# Patient Record
Sex: Female | Born: 1955 | Race: White | Hispanic: No | Marital: Married | Smoking: Never smoker
Health system: Southern US, Community
[De-identification: ages and names within clinical notes are randomized; demographics above are authoritative.]

## PROBLEM LIST (undated history)

## (undated) DIAGNOSIS — G40909 Epilepsy, unspecified, not intractable, without status epilepticus: Secondary | ICD-10-CM

## (undated) DIAGNOSIS — Z923 Personal history of irradiation: Secondary | ICD-10-CM

## (undated) DIAGNOSIS — Z8489 Family history of other specified conditions: Secondary | ICD-10-CM

## (undated) DIAGNOSIS — G253 Myoclonus: Secondary | ICD-10-CM

## (undated) DIAGNOSIS — R112 Nausea with vomiting, unspecified: Secondary | ICD-10-CM

## (undated) DIAGNOSIS — K5732 Diverticulitis of large intestine without perforation or abscess without bleeding: Secondary | ICD-10-CM

## (undated) DIAGNOSIS — G43909 Migraine, unspecified, not intractable, without status migrainosus: Secondary | ICD-10-CM

## (undated) DIAGNOSIS — N809 Endometriosis, unspecified: Secondary | ICD-10-CM

## (undated) DIAGNOSIS — M797 Fibromyalgia: Secondary | ICD-10-CM

## (undated) DIAGNOSIS — N179 Acute kidney failure, unspecified: Secondary | ICD-10-CM

## (undated) DIAGNOSIS — G4730 Sleep apnea, unspecified: Secondary | ICD-10-CM

## (undated) DIAGNOSIS — B029 Zoster without complications: Secondary | ICD-10-CM

## (undated) DIAGNOSIS — C4490 Unspecified malignant neoplasm of skin, unspecified: Secondary | ICD-10-CM

## (undated) DIAGNOSIS — Z9889 Other specified postprocedural states: Secondary | ICD-10-CM

## (undated) DIAGNOSIS — M3500 Sicca syndrome, unspecified: Secondary | ICD-10-CM

## (undated) DIAGNOSIS — R569 Unspecified convulsions: Secondary | ICD-10-CM

## (undated) DIAGNOSIS — C50919 Malignant neoplasm of unspecified site of unspecified female breast: Secondary | ICD-10-CM

## (undated) HISTORY — DX: Fibromyalgia: M79.7

## (undated) HISTORY — PX: BREAST BIOPSY: SHX20

## (undated) HISTORY — PX: TUBAL LIGATION: SHX77

## (undated) HISTORY — PX: KNEE CARTILAGE SURGERY: SHX688

## (undated) HISTORY — DX: Endometriosis, unspecified: N80.9

## (undated) HISTORY — DX: Diverticulitis of large intestine without perforation or abscess without bleeding: K57.32

## (undated) HISTORY — DX: Malignant neoplasm of unspecified site of unspecified female breast: C50.919

## (undated) HISTORY — DX: Unspecified convulsions: R56.9

## (undated) HISTORY — DX: Migraine, unspecified, not intractable, without status migrainosus: G43.909

## (undated) HISTORY — DX: Zoster without complications: B02.9

## (undated) HISTORY — DX: Epilepsy, unspecified, not intractable, without status epilepticus: G40.909

## (undated) HISTORY — PX: OTHER SURGICAL HISTORY: SHX169

---

## 1993-02-15 HISTORY — PX: LAPAROSCOPIC CHOLECYSTECTOMY: SUR755

## 2008-12-12 ENCOUNTER — Ambulatory Visit: Payer: Self-pay | Admitting: Family Medicine

## 2008-12-12 DIAGNOSIS — B029 Zoster without complications: Secondary | ICD-10-CM | POA: Insufficient documentation

## 2008-12-17 ENCOUNTER — Telehealth: Payer: Self-pay | Admitting: Family Medicine

## 2008-12-31 ENCOUNTER — Ambulatory Visit: Payer: Self-pay | Admitting: Family Medicine

## 2008-12-31 DIAGNOSIS — L538 Other specified erythematous conditions: Secondary | ICD-10-CM | POA: Insufficient documentation

## 2008-12-31 DIAGNOSIS — R569 Unspecified convulsions: Secondary | ICD-10-CM

## 2008-12-31 DIAGNOSIS — E538 Deficiency of other specified B group vitamins: Secondary | ICD-10-CM

## 2008-12-31 HISTORY — DX: Deficiency of other specified B group vitamins: E53.8

## 2009-01-01 LAB — CONVERTED CEMR LAB
Basophils Relative: 0 % (ref 0–1)
CO2: 20 meq/L (ref 19–32)
Calcium: 9.5 mg/dL (ref 8.4–10.5)
Chloride: 102 meq/L (ref 96–112)
Creatinine, Ser: 0.94 mg/dL (ref 0.40–1.20)
Eosinophils Absolute: 0.1 10*3/uL (ref 0.0–0.7)
Glucose, Bld: 88 mg/dL (ref 70–99)
Hemoglobin: 14.5 g/dL (ref 12.0–15.0)
MCHC: 32.4 g/dL (ref 30.0–36.0)
MCV: 97.4 fL (ref 78.0–100.0)
Monocytes Absolute: 0.5 10*3/uL (ref 0.1–1.0)
Monocytes Relative: 12 % (ref 3–12)
RBC: 4.6 M/uL (ref 3.87–5.11)
Sodium: 140 meq/L (ref 135–145)
Total Bilirubin: 0.4 mg/dL (ref 0.3–1.2)
Total Protein: 7.1 g/dL (ref 6.0–8.3)
Vitamin B-12: 750 pg/mL (ref 211–911)

## 2009-10-01 ENCOUNTER — Encounter: Payer: Self-pay | Admitting: Family Medicine

## 2009-10-06 ENCOUNTER — Encounter: Payer: Self-pay | Admitting: Family Medicine

## 2009-10-23 ENCOUNTER — Encounter: Payer: Self-pay | Admitting: Family Medicine

## 2009-10-24 ENCOUNTER — Encounter: Payer: Self-pay | Admitting: Family Medicine

## 2009-12-18 ENCOUNTER — Encounter: Payer: Self-pay | Admitting: Family Medicine

## 2009-12-19 ENCOUNTER — Ambulatory Visit: Payer: Self-pay | Admitting: Family Medicine

## 2009-12-19 DIAGNOSIS — M2550 Pain in unspecified joint: Secondary | ICD-10-CM | POA: Insufficient documentation

## 2009-12-19 DIAGNOSIS — M797 Fibromyalgia: Secondary | ICD-10-CM

## 2009-12-20 ENCOUNTER — Encounter: Payer: Self-pay | Admitting: Family Medicine

## 2009-12-22 LAB — CONVERTED CEMR LAB
Anti Nuclear Antibody(ANA): NEGATIVE
Sed Rate: 16 mm/hr (ref 0–22)

## 2009-12-29 ENCOUNTER — Telehealth: Payer: Self-pay | Admitting: Family Medicine

## 2010-01-05 ENCOUNTER — Telehealth: Payer: Self-pay | Admitting: Family Medicine

## 2010-01-15 HISTORY — PX: APPENDECTOMY: SHX54

## 2010-01-23 ENCOUNTER — Ambulatory Visit: Payer: Self-pay | Admitting: Family Medicine

## 2010-01-23 DIAGNOSIS — M25569 Pain in unspecified knee: Secondary | ICD-10-CM

## 2010-03-12 ENCOUNTER — Ambulatory Visit: Admit: 2010-03-12 | Payer: Self-pay | Admitting: Family Medicine

## 2010-03-17 DIAGNOSIS — T8131XA Disruption of external operation (surgical) wound, not elsewhere classified, initial encounter: Secondary | ICD-10-CM | POA: Insufficient documentation

## 2010-03-17 NOTE — Letter (Signed)
Summary: [facility] @ [place]   Imported By: Lanelle Bal 10/16/2009 08:36:31  _____________________________________________________________________  External Attachment:    Type:   Image     Comment:   External Document
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
## 2010-03-17 NOTE — Progress Notes (Signed)
Summary: Med question  Phone Note Call from Patient   Caller: Patient Summary of Call: Called and states that the new med she has started taking "Savella" is making her dizzy and she would like to go back to CymBalta and is not sure how to quite the Savella starter pack and go back to her Cymbalta... Call her  back at [phone] Initial call taken by: Michaelle Copas,  January 05, 2010 10:11 AM  Follow-up for Phone Call        we stopped cymbalta b/c it wasn't working -- now she wants to go back on it??? Follow-up by: Seymour Bars DO,  January 05, 2010 10:24 AM  Additional Follow-up for Phone Call Additional follow up Details #1::        Yes states would like to give it another try Additional Follow-up by: Kathlene November LPN,  January 05, 2010 10:30 AM    Additional Follow-up for Phone Call Additional follow up Details #2::    if she was only up to the 25 mg dose of Savella, OK to stop and switch to Cymbalta -- have her pick up a sample box of 30 mg to start for the first wk and I will send in RX for Cymbalta 60s.    Have f/u visit in 4 wks. Follow-up by: Seymour Bars DO,  January 05, 2010 12:45 PM   Appended Document: Med question   Appended Document: Med question 01/05/2010 @ 12:52pm- Pt notified of above instructions. KJ LPN

## 2010-03-17 NOTE — Progress Notes (Signed)
Summary: pt. with a med. concern  Phone Note Call from Patient   Caller: Patient Summary of Call: pt .needs a call back Re: Savella medication, and she is wondering if this med. will effect her having grandma seizures since she is on other meds for her Seizures.... She has already taken 1 pill this morning... Please call her back as soon as possible [phone] as she is worried that this may effect her because of what she has read... Thanks.Michaelle Copas  December 29, 2009 1:51 PM  Initial call taken by: Michaelle Copas,  December 29, 2009 1:51 PM  Follow-up for Phone Call        no, our EMR does a drug interaction check and it does not pull up as an interaction with her seizure meds. Follow-up by: Seymour Bars DO,  December 29, 2009 4:46 PM

## 2010-03-19 ENCOUNTER — Telehealth: Payer: Self-pay | Admitting: Family Medicine

## 2010-03-19 NOTE — Letter (Signed)
Summary: [facility]  [facility]   Imported By: Lanelle Bal 02/13/2010 12:05:46  _____________________________________________________________________  External Attachment:    Type:   Image     Comment:   External Document

## 2010-03-19 NOTE — Letter (Signed)
Summary: [facility]  [facility]   Imported By: Lanelle Bal 02/13/2010 12:07:03  _____________________________________________________________________  External Attachment:    Type:   Image     Comment:   External Document

## 2010-03-19 NOTE — Letter (Signed)
Summary: [facility]  [facility]   Imported By: Lanelle Bal 02/13/2010 12:04:05  _____________________________________________________________________  External Attachment:    Type:   Image     Comment:   External Document

## 2010-03-19 NOTE — Letter (Signed)
Summary: [facility]  [facility]   Imported By: Lanelle Bal 02/13/2010 12:05:00  _____________________________________________________________________  External Attachment:    Type:   Image     Comment:   External Document

## 2010-03-24 ENCOUNTER — Encounter: Payer: Self-pay | Admitting: Family Medicine

## 2010-03-24 ENCOUNTER — Ambulatory Visit (INDEPENDENT_AMBULATORY_CARE_PROVIDER_SITE_OTHER): Payer: BC Managed Care – PPO | Admitting: Family Medicine

## 2010-03-24 DIAGNOSIS — F329 Major depressive disorder, single episode, unspecified: Secondary | ICD-10-CM | POA: Insufficient documentation

## 2010-03-24 HISTORY — DX: Major depressive disorder, single episode, unspecified: F32.9

## 2010-03-25 NOTE — Progress Notes (Signed)
Summary: [facility]-Cymbalta question  Phone Note Call from Patient Call back at [phone]   Caller: Patient Call For: Seymour Bars DO Summary of Call: Pt missed appt on 1/26 due to being in the hospital with appendix.  Pt states cymbalta is $180/month and she is unable to afford.  She would like to come off med.  She has 7 tablets left.  Pt would like to know if she needs to wean adn if so, what is weaning schedule?  Pt will call to make appt after she has follow up with 2 other physicians. Initial call taken by: Francee Piccolo CMA Duncan Dull),  March 19, 2010 1:34 PM  Follow-up for Phone Call        if she is only on the 30 mg dose, I'd simply recommend taking 1 tab every other day for the last 5 tabs then stop. Follow-up by: Seymour Bars DO,  March 19, 2010 2:29 PM     Appended Document: [facility]-Cymbalta question pt notified of above.  She voiced good understanding and is agreeable with plan.
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
## 2010-04-17 ENCOUNTER — Ambulatory Visit: Payer: BC Managed Care – PPO | Admitting: Family Medicine

## 2010-05-07 ENCOUNTER — Encounter: Payer: Self-pay | Admitting: Emergency Medicine

## 2010-05-07 ENCOUNTER — Inpatient Hospital Stay (INDEPENDENT_AMBULATORY_CARE_PROVIDER_SITE_OTHER)
Admission: RE | Admit: 2010-05-07 | Discharge: 2010-05-07 | Disposition: A | Discharge status: Home / Self Care | Payer: BC Managed Care – PPO | Source: Ambulatory Visit | Attending: Emergency Medicine | Admitting: Emergency Medicine

## 2010-05-07 DIAGNOSIS — G43109 Migraine with aura, not intractable, without status migrainosus: Secondary | ICD-10-CM

## 2010-05-08 ENCOUNTER — Encounter: Payer: Self-pay | Admitting: Emergency Medicine

## 2010-05-14 NOTE — Letter (Signed)
Summary: RX POLICY   RX POLICY   Imported By: Dannette Barbara 05/08/2010 08:41:25  _____________________________________________________________________  External Attachment:    Type:   Image     Comment:   External Document

## 2010-05-14 NOTE — Assessment & Plan Note (Signed)
Summary: MIGRAINE HEADACHES/NAUESA    Current Allergies: ! * OPIATES ! LIDODERM (LIDOCAINE)History of Present Illness History from: patient History of Present Illness: MIgraine HA today.  She has a history of them but not in about a year.  She is followed by a neurologist.  This one is milder than usual, but she thinks she got it from being out in the sun today.  No numbness, weakness.  Mild fatigue and cold fingers.  No CP, SOB, visual problems, gait problems, or other neuro deficits.   Physical Exam General appearance: well developed, well nourished, no acute distress Pupils: equal, round, reactive to light Ears: normal, no lesions or deformities Nasal: mucosa pink, nonedematous, no septal deviation, turbinates normal Oral/Pharynx: tongue normal, posterior pharynx without erythema or exudate Heart: regular rate and  rhythm, no murmur Neurological: grossly intact and non-focal MSE: oriented to time, place, and person Assessment New Problems: MIGRAINE (ICD-346.00)   Plan New Medications/Changes: PROMETHAZINE HCL 25 MG SUPP (PROMETHAZINE HCL) 1 PR q8 hrs as needed for nausea  #12 x 0, 05/07/2010, Hoyt Koch MD IMITREX 20 MG/ACT SOLN (SUMATRIPTAN) 1 nasal spray (max 2 sprays per day)  #1 x 0, 05/07/2010, Hoyt Koch MD IMITREX 50 MG TABS (SUMATRIPTAN SUCCINATE) 1 by mouth x1, may repeat in 2 hours if not better.  Max 3 per day.  #6 x 0, 05/07/2010, Hoyt Koch MD  New Orders: New Patient Level III [phone] Sumatriptan Succinate /6mg  injection [J3030] Planning Comments:   Take meds as directed. IM Imitrex given in clinic Rx for nasal IMitrex + PR phenergan given Rest, hydration.  Follow up with your neurologist   The patient and/or caregiver has been counseled thoroughly with regard to medications prescribed including dosage, schedule, interactions, rationale for use, and possible side effects and they verbalize understanding.  Diagnoses and expected course of  recovery discussed and will return if not improved as expected or if the condition worsens. Patient and/or caregiver verbalized understanding.  Prescriptions: PROMETHAZINE HCL 25 MG SUPP (PROMETHAZINE HCL) 1 PR q8 hrs as needed for nausea  #12 x 0   Entered and Authorized by:   Hoyt Koch MD   Signed by:   Hoyt Koch MD on 05/07/2010   Method used:   Print then Give to Patient   RxID:   [id number] IMITREX 20 MG/ACT SOLN (SUMATRIPTAN) 1 nasal spray (max 2 sprays per day)  #1 x 0   Entered and Authorized by:   Hoyt Koch MD   Signed by:   Hoyt Koch MD on 05/07/2010   Method used:   Print then Give to Patient   RxID:   [id number] IMITREX 50 MG TABS (SUMATRIPTAN SUCCINATE) 1 by mouth x1, may repeat in 2 hours if not better.  Max 3 per day.  #6 x 0   Entered and Authorized by:   Hoyt Koch MD   Signed by:   Hoyt Koch MD on 05/07/2010   Method used:   Print then Give to Patient   RxID:   [id number]   Orders Added: 1)  New Patient Level III [25366] 2)  Sumatriptan Succinate /6mg  injection [J3030]  Appended Document: MIGRAINE HEADACHES/NAUESA V/S 63" 213lbs HR: 97, RR 22, 148/92, 100% on RA, 97.7temp. Patient c/o migraine, dizziness and nausea x one hour.  History of epilepsy and appendectomy Home Meds: Keppra XR 500mg  qid Lamictal, and cymbalta. She took  a phenergan supp.   Imetrex given Subcutaneously in right thigh. Patient reacted with nausea and diaphoresis. Patient revovered in  about 5 minutes. Her BP after reaaction is 141/91 and HR 94

## 2010-07-10 ENCOUNTER — Encounter: Payer: Self-pay | Admitting: Family Medicine

## 2010-07-10 ENCOUNTER — Ambulatory Visit (INDEPENDENT_AMBULATORY_CARE_PROVIDER_SITE_OTHER): Payer: BC Managed Care – PPO | Admitting: Family Medicine

## 2010-07-10 ENCOUNTER — Telehealth: Payer: Self-pay | Admitting: Family Medicine

## 2010-07-10 VITALS — BP 122/78 | HR 90 | Temp 98.2°F | Ht 63.0 in | Wt 212.0 lb

## 2010-07-10 DIAGNOSIS — B029 Zoster without complications: Secondary | ICD-10-CM

## 2010-07-10 DIAGNOSIS — N61 Mastitis without abscess: Secondary | ICD-10-CM

## 2010-07-10 DIAGNOSIS — R51 Headache: Secondary | ICD-10-CM

## 2010-07-10 DIAGNOSIS — G43909 Migraine, unspecified, not intractable, without status migrainosus: Secondary | ICD-10-CM | POA: Insufficient documentation

## 2010-07-10 LAB — CBC WITH DIFFERENTIAL/PLATELET
Basophils Absolute: 0 10*3/uL (ref 0.0–0.1)
Eosinophils Relative: 0 % (ref 0–5)
Lymphocytes Relative: 24 % (ref 12–46)
Neutro Abs: 6.1 10*3/uL (ref 1.7–7.7)
Neutrophils Relative %: 72 % (ref 43–77)
Platelets: 187 10*3/uL (ref 150–400)
RDW: 13.5 % (ref 11.5–15.5)
WBC: 8.5 10*3/uL (ref 4.0–10.5)

## 2010-07-10 MED ORDER — OXYCODONE-ACETAMINOPHEN 5-325 MG PO TABS: ORAL_TABLET | ORAL | Status: DC

## 2010-07-10 MED ORDER — DULOXETINE HCL 60 MG PO CPEP: 60.0000 mg | ORAL_CAPSULE | Freq: Every day | ORAL | Status: DC

## 2010-07-10 MED ORDER — VALACYCLOVIR HCL 1 G PO TABS: 1000.0000 mg | ORAL_TABLET | Freq: Three times a day (TID) | ORAL | Status: AC

## 2010-07-10 MED ORDER — KETOROLAC TROMETHAMINE 60 MG/2ML IM SOLN
60.0000 mg | Freq: Once | INTRAMUSCULAR | Status: AC
  Administered 2010-07-10: 60 mg via INTRAMUSCULAR

## 2010-07-10 MED ORDER — CEPHALEXIN 500 MG PO CAPS: 500.0000 mg | ORAL_CAPSULE | Freq: Four times a day (QID) | ORAL | Status: AC

## 2010-07-10 NOTE — Telephone Encounter (Signed)
Pt called and feels she is having a worse attack ever of her fibromyalgia and wants to know if she can double the cymbalta to 60 mg????  Pain LT nipple and breast, back side of body sore. Plan:  Routed to Dr. Arlice Colt, LPN Domingo Dimes

## 2010-07-10 NOTE — Telephone Encounter (Signed)
Pt was seen for OV and this was addressed today already.

## 2010-07-10 NOTE — Assessment & Plan Note (Signed)
Likely herpes zoster causing acute pain and redness over L breast but cannot completely r/o breast cellulitis given lack of blisters not even 24 hrs into onset of symptoms.  Will cover with both Keflex and Valtrex starting today and treat pain with percocet prn.  She will keep an eye out for blisters and will look for a leukocytosis to point towards cellulitis on her CBC today.

## 2010-07-10 NOTE — Telephone Encounter (Signed)
Pls let pt know that her WBC came back NORMAL, making the diagnosis of shingles more likely.  I still want her to take the Keflex though.

## 2010-07-10 NOTE — Patient Instructions (Signed)
toradol 60 mg IM given today for pain/ migraine.  CBC downstairs today. Will call you w/ result this afternoon.  If WBC count is HIGH, you likely have cellulitis and the Keflex will treat this. If you develop blisters, you likely have shingles, in which case you need to finish out the Valtrex.  Use Percocet as needed for pain.  Caution about sedation and constipating Side Effects.  Return for f/u on Wednesday.

## 2010-07-10 NOTE — Assessment & Plan Note (Signed)
Moderate HA secondary to breast cellulits vs shingles.  Tramadol did not help last night.  Toradol 60 mg IM given today.

## 2010-07-10 NOTE — Telephone Encounter (Signed)
Pt aware of the above  

## 2010-07-10 NOTE — Progress Notes (Signed)
  Subjective:    Patient ID: Margaret Beasley, female    DOB: [birth year], 55 y.o.   MRN: [id number]  HPI  55 yo WF presents for L breast pain that started yesterday at 3 pm acutely.  Her L breast became red.  She went to the ED - [facility] last night.  She sat in the Ed last night but left before being seen.  She started to get a migraine last night and feels a bit achey and has chills but no fevers.  No redness elsewhere.  Her breast is extremely sore to touch and even her shirt bothers her skin.  She had shingles about 2 yrs ago.  No blisters have appeared.  Denies preceeding illness, abrasions or insect bites.    BP 122/78  Pulse 90  Temp(Src) 98.2 F (36.8 C) (Oral)  Ht 5\' 3"  (1.6 m)  Wt 212 lb (96.163 kg)  BMI 37.55 kg/m2  SpO2 98%    Review of Systems  Constitutional: Positive for chills and fatigue. Negative for fever and unexpected weight change.  HENT: Positive for neck pain.   Respiratory: Negative for cough, chest tightness and shortness of breath.   Cardiovascular: Negative for chest pain and palpitations.  Neurological: Positive for numbness. Negative for dizziness and weakness.  Hematological: Negative for adenopathy. Does not bruise/bleed easily.       Objective:   Physical Exam  Constitutional: She appears well-developed and well-nourished. No distress.  Eyes: Conjunctivae are normal. No scleral icterus.  Neck: Neck supple.  Cardiovascular: Normal rate, regular rhythm and normal heart sounds.   No murmur heard. Pulmonary/Chest: Effort normal and breath sounds normal. No respiratory distress.  Musculoskeletal: She exhibits no edema.  Lymphadenopathy:    She has no cervical adenopathy.  Skin: Skin is warm and dry. There is erythema (streaking erythema with warmth, no vesicles over the entire L breast).  Psychiatric: She has a normal mood and affect.          Assessment & Plan:

## 2010-07-15 ENCOUNTER — Ambulatory Visit (INDEPENDENT_AMBULATORY_CARE_PROVIDER_SITE_OTHER): Payer: BC Managed Care – PPO | Admitting: Family Medicine

## 2010-07-15 ENCOUNTER — Encounter: Payer: Self-pay | Admitting: Family Medicine

## 2010-07-15 VITALS — BP 155/87 | HR 82 | Wt 212.0 lb

## 2010-07-15 DIAGNOSIS — N61 Mastitis without abscess: Secondary | ICD-10-CM | POA: Insufficient documentation

## 2010-07-15 NOTE — Progress Notes (Signed)
  Subjective:    Patient ID: Margaret Beasley, female    DOB: 26-Oct-1955, 55 y.o.   MRN: [id number]  HPI 55 yo WF presents for f/u L breast pain and redness.  She was seen on day 2 of symptoms and it was unsure if she had early Zoster last Friday.  She was started on both Keflex for cellulitis and Valtrex for possible zoster.  Her CBC was normal.  She never did develop blisters.  She still has 3 more days of Valtrex but thinks it is causing HAs.  She is no longer having pain.  The red streaky marks are almost all gone.  Denies fevers, chills, nipple discharge or other symptoms.    BP 155/87  Pulse 82  Wt 212 lb (96.163 kg)  SpO2 97%     Review of Systems  Constitutional: Negative for fever, chills and fatigue.  Musculoskeletal: Negative for myalgias and arthralgias.       Objective:   Physical Exam  Constitutional: She appears well-developed and well-nourished. No distress.       obese  HENT:  Head: Normocephalic and atraumatic.  Neck: Neck supple.  Cardiovascular: Normal rate, regular rhythm and normal heart sounds.   Pulmonary/Chest: Effort normal and breath sounds normal.  Lymphadenopathy:    She has no cervical adenopathy.  Skin: Skin is warm and dry.       Faint erythema over the lateral L breast but no longer tender.  Superior and medical erythema is gone.  No blisters or scabbing seen.          Assessment & Plan:

## 2010-07-15 NOTE — Patient Instructions (Addendum)
Finish out Keflex.  Stop Valtrex.  F/U with your neurologist.  Use Percocet 1/2 tab today for Headache.  Call if any problems.  Derm referral will be made.  Return for f/u mood/ BP in 2 months.

## 2010-07-15 NOTE — Assessment & Plan Note (Signed)
Breast Cellulitis much more likely than Zoster as correct dx.  She is to finish out her antibiotics and stop the Valtrex since it seems to be causing HAs.  She has percocet to use as needed for residual HA pain which seems to be raising her BP.  Her CBC was normal and clinically, she is much improved now.  Call if any problems occur.  She has f/u with neuro this wk for HAs and epilepsy.

## 2010-09-11 ENCOUNTER — Telehealth: Payer: Self-pay | Admitting: Family Medicine

## 2010-09-11 DIAGNOSIS — M25569 Pain in unspecified knee: Secondary | ICD-10-CM

## 2010-09-11 NOTE — Telephone Encounter (Signed)
Ortho referral placed for knee pain.

## 2010-09-15 ENCOUNTER — Ambulatory Visit: Payer: BC Managed Care – PPO | Admitting: Family Medicine

## 2010-09-29 ENCOUNTER — Telehealth: Payer: Self-pay | Admitting: *Deleted

## 2010-09-29 MED ORDER — DULOXETINE HCL 30 MG PO CPEP: 30.0000 mg | ORAL_CAPSULE | Freq: Every day | ORAL | Status: DC

## 2010-09-29 NOTE — Telephone Encounter (Signed)
Pt called stating she is having increased pain due to fibromyalgia and would like to have Cymbalta increased to 90mg . Is this OK?

## 2010-09-29 NOTE — Telephone Encounter (Signed)
Pt aware.

## 2010-09-29 NOTE — Telephone Encounter (Signed)
Ok to increase to 90 nad then f/u in 6 weeks ot see if helping. Will call in extra 30g tab to take with her 60mg .

## 2010-10-27 ENCOUNTER — Encounter: Payer: Self-pay | Admitting: Family Medicine

## 2010-10-27 ENCOUNTER — Ambulatory Visit (INDEPENDENT_AMBULATORY_CARE_PROVIDER_SITE_OTHER): Payer: BC Managed Care – PPO | Admitting: Family Medicine

## 2010-10-27 VITALS — BP 130/85 | HR 82 | Ht 66.0 in | Wt 211.0 lb

## 2010-10-27 DIAGNOSIS — R21 Rash and other nonspecific skin eruption: Secondary | ICD-10-CM

## 2010-10-27 DIAGNOSIS — E669 Obesity, unspecified: Secondary | ICD-10-CM

## 2010-10-27 DIAGNOSIS — M797 Fibromyalgia: Secondary | ICD-10-CM

## 2010-10-27 DIAGNOSIS — IMO0001 Reserved for inherently not codable concepts without codable children: Secondary | ICD-10-CM

## 2010-10-27 MED ORDER — MUPIROCIN 2 % EX OINT: TOPICAL_OINTMENT | CUTANEOUS | Status: DC

## 2010-10-27 MED ORDER — DULOXETINE HCL 60 MG PO CPEP: 60.0000 mg | ORAL_CAPSULE | Freq: Two times a day (BID) | ORAL | Status: DC

## 2010-10-27 NOTE — Progress Notes (Signed)
  Subjective:    Patient ID: Margaret Beasley, female    DOB: 1956/02/01, 55 y.o.   MRN: [id number]  HPI Patient reports improvement w/increasing the cymbalta from 60 mg to 90 mg . She still has an occasional break trhough but not like she did before. Her migraines are often exacerbated and are more frequent when she has her fibromyalgia flare ups. We talked about keeping her at 75 are increasing her to 120 mg and she wants to go ahead and increase her dosage.    Review of Systems  Constitutional: Positive for activity change.  Skin: Positive for rash.  Neurological: Positive for headaches.   She states the rash on the L side of her nose has been irritated and is worse when she wears glasses     Objective:   Physical Exam  Constitutional: She appears well-developed and well-nourished.  Neurological: She is alert.  Skin: No rash noted.       Patient w/hyperemic area over bridge of L nostril   Psychiatric: She has a normal mood and affect. Her behavior is normal. Thought content normal.          Assessment & Plan:  Will increase her cymbalta to 120 mg she may need to change mail-order pharmacy For the nasal irritation on her nasal bridge since it bothers her and was seen by the Dermatologist according to her and told benign will try her on Bactroban ointment

## 2010-11-02 ENCOUNTER — Ambulatory Visit: Payer: BC Managed Care – PPO | Admitting: Family Medicine

## 2010-11-09 ENCOUNTER — Ambulatory Visit (INDEPENDENT_AMBULATORY_CARE_PROVIDER_SITE_OTHER): Payer: BC Managed Care – PPO | Admitting: Family Medicine

## 2010-11-09 ENCOUNTER — Encounter: Payer: Self-pay | Admitting: Family Medicine

## 2010-11-09 VITALS — BP 147/95 | HR 74 | Wt 204.0 lb

## 2010-11-09 DIAGNOSIS — G43909 Migraine, unspecified, not intractable, without status migrainosus: Secondary | ICD-10-CM

## 2010-11-09 DIAGNOSIS — IMO0001 Reserved for inherently not codable concepts without codable children: Secondary | ICD-10-CM

## 2010-11-09 DIAGNOSIS — M797 Fibromyalgia: Secondary | ICD-10-CM

## 2010-11-09 DIAGNOSIS — Z1231 Encounter for screening mammogram for malignant neoplasm of breast: Secondary | ICD-10-CM

## 2010-11-09 MED ORDER — DULOXETINE HCL 60 MG PO CPEP: 60.0000 mg | ORAL_CAPSULE | Freq: Every day | ORAL | Status: DC

## 2010-11-09 MED ORDER — DULOXETINE HCL 30 MG PO CPEP: 30.0000 mg | ORAL_CAPSULE | Freq: Every day | ORAL | Status: DC

## 2010-11-09 NOTE — Progress Notes (Signed)
  Subjective:    Patient ID: Margaret Beasley, female    DOB: [birth year], 55 y.o.   MRN: [id number]  HPI Migraines - doing well. Hasn't had one in about 8 months. Uses the imetrex nasal spray.  Doe have a hx of seizure d/o. She typically uses her trochanter bursa but hasn't worked and she uses her narcotic pain medication.  Right knee surgery about 2 yers ago and "scraped it" had a meniscal tear as well that they repaired. Now her knee clicks and hurts when she twists her knee. Feels like a tingle down the side of her shin at night. IBU helps her.  Also putting a knee splint on it. Heating pad can help as well.  Feels like a toothache. Hx of fibromyalgia.   Cymbalta - she was recently here and discussed the possibility of increasing her Cymbalta since she did well on 90 mg 220 mg. But evidently the more she thought about it the more nervous she was about it affecting her other medications and her seizure disorder.   Review of Systems     Objective:   Physical Exam  Constitutional: She is oriented to person, place, and time. She appears well-developed and well-nourished.  HENT:  Head: Normocephalic and atraumatic.  Cardiovascular: Normal rate, regular rhythm and normal heart sounds.   Pulmonary/Chest: Effort normal and breath sounds normal.  Neurological: She is alert and oriented to person, place, and time.  Skin: Skin is warm and dry.  Psychiatric: She has a normal mood and affect. Her behavior is normal.          Assessment & Plan:  Dsicussed need for colonoscopy.   Migraines - Well controlled. Her last headache was about 8 months ago. She is doing well from that perspective.  Fibromyalgia-after further discussion she decided she would like to stay at 90 mg on her Cymbalta. Therefore her new prescription for 60 mg and 30 mg to take together. I explained that she will have to pay 2 co-pays.  Right lower leg pain-at this point, recommended she consider going back and see an  orthopedist as I do think this is probably related to her knee pain. I think she could also be a good candidate for physical therapy. She never really decided she wanted to do this here during office visit.

## 2010-11-24 ENCOUNTER — Ambulatory Visit: Payer: BC Managed Care – PPO

## 2010-12-07 ENCOUNTER — Telehealth: Payer: Self-pay | Admitting: Family Medicine

## 2010-12-09 NOTE — Telephone Encounter (Signed)
Closed

## 2010-12-22 ENCOUNTER — Ambulatory Visit
Admission: RE | Admit: 2010-12-22 | Discharge: 2010-12-22 | Disposition: A | Discharge status: Home / Self Care | Payer: BC Managed Care – PPO | Source: Ambulatory Visit | Attending: Family Medicine | Admitting: Family Medicine

## 2010-12-22 DIAGNOSIS — Z1231 Encounter for screening mammogram for malignant neoplasm of breast: Secondary | ICD-10-CM

## 2010-12-25 ENCOUNTER — Other Ambulatory Visit: Payer: Self-pay | Admitting: Family Medicine

## 2010-12-25 DIAGNOSIS — R928 Other abnormal and inconclusive findings on diagnostic imaging of breast: Secondary | ICD-10-CM

## 2010-12-29 ENCOUNTER — Encounter: Payer: Self-pay | Admitting: Family Medicine

## 2010-12-29 ENCOUNTER — Ambulatory Visit (INDEPENDENT_AMBULATORY_CARE_PROVIDER_SITE_OTHER): Payer: BC Managed Care – PPO | Admitting: Family Medicine

## 2010-12-29 DIAGNOSIS — R928 Other abnormal and inconclusive findings on diagnostic imaging of breast: Secondary | ICD-10-CM

## 2010-12-29 DIAGNOSIS — M797 Fibromyalgia: Secondary | ICD-10-CM

## 2010-12-29 DIAGNOSIS — IMO0001 Reserved for inherently not codable concepts without codable children: Secondary | ICD-10-CM

## 2010-12-29 DIAGNOSIS — G43909 Migraine, unspecified, not intractable, without status migrainosus: Secondary | ICD-10-CM

## 2010-12-29 MED ORDER — DULOXETINE HCL 30 MG PO CPEP: 90.0000 mg | ORAL_CAPSULE | Freq: Every day | ORAL | Status: DC

## 2010-12-29 NOTE — Progress Notes (Signed)
  Subjective:    Patient ID: Margaret Beasley, female    DOB: Jul 05, 1955, 55 y.o.   MRN: [id number]  HPI Fibromylagia - Has lost about 10 lbs. Has been doing some brisk walking. She is still on a diet.  She is excited about his.Sys the strange sensatin she gets on her lower right leg is better on the higher dose cymbalta. She has occ been using half a pain pill for this.   HA - Says still well controlled  Abnormal Mammo- She has been sched for a diagnostic and [place].  Had calcifications in the Left breast and possible mass in the right.   Review of Systems     Objective:   Physical Exam  Constitutional: She is oriented to person, place, and time. She appears well-developed and well-nourished.  HENT:  Head: Normocephalic and atraumatic.  Cardiovascular: Normal rate, regular rhythm and normal heart sounds.   Pulmonary/Chest: Effort normal and breath sounds normal.  Neurological: She is alert and oriented to person, place, and time.  Skin: Skin is warm and dry.  Psychiatric: She has a normal mood and affect. Her behavior is normal.          Assessment & Plan:  HA - Are still well controlled.    Fibromyalgia - doing well. Will change Cymbalta rx to 30mg  (3 tabs daily) to help with cost. She feels much better on teh 90 mg dose. F/U in 3 mo. Keep up the exercise. She is doing fantastic with her weight loss.   Abnormal Mammogram - Explained next step.  We should get copy of results.  She has also noticed nail changes in teh last month or two. I offered to do labs but she will hold off until after her mammo workup.

## 2011-01-08 ENCOUNTER — Other Ambulatory Visit: Payer: Self-pay | Admitting: Family Medicine

## 2011-01-08 ENCOUNTER — Ambulatory Visit
Admission: RE | Admit: 2011-01-08 | Discharge: 2011-01-08 | Disposition: A | Discharge status: Home / Self Care | Payer: BC Managed Care – PPO | Source: Ambulatory Visit | Attending: Family Medicine | Admitting: Family Medicine

## 2011-01-08 DIAGNOSIS — R928 Other abnormal and inconclusive findings on diagnostic imaging of breast: Secondary | ICD-10-CM

## 2011-01-11 ENCOUNTER — Telehealth: Payer: Self-pay | Admitting: *Deleted

## 2011-01-11 NOTE — Telephone Encounter (Signed)
Called pt and discussed her results.  She has her bx scheduled. She is distressed about the wait.  Encouragd her to hang in there. She is nervous as has a family hx of brCa.

## 2011-01-11 NOTE — Telephone Encounter (Signed)
Pt calls and would like to speak with you in regards to her mammogram. Has questions- abnormal and was sent for biopsy. Please call her back at [phone]

## 2011-01-18 ENCOUNTER — Other Ambulatory Visit: Payer: Self-pay | Admitting: Family Medicine

## 2011-01-18 ENCOUNTER — Other Ambulatory Visit: Payer: Self-pay | Admitting: Diagnostic Radiology

## 2011-01-18 ENCOUNTER — Ambulatory Visit
Admission: RE | Admit: 2011-01-18 | Discharge: 2011-01-18 | Disposition: A | Discharge status: Home / Self Care | Payer: BC Managed Care – PPO | Source: Ambulatory Visit | Attending: Family Medicine | Admitting: Family Medicine

## 2011-01-18 DIAGNOSIS — R928 Other abnormal and inconclusive findings on diagnostic imaging of breast: Secondary | ICD-10-CM

## 2011-01-20 ENCOUNTER — Other Ambulatory Visit: Payer: Self-pay | Admitting: Family Medicine

## 2011-01-20 ENCOUNTER — Ambulatory Visit
Admission: RE | Admit: 2011-01-20 | Discharge: 2011-01-20 | Disposition: A | Discharge status: Home / Self Care | Payer: BC Managed Care – PPO | Source: Ambulatory Visit | Attending: Family Medicine | Admitting: Family Medicine

## 2011-01-20 DIAGNOSIS — C50912 Malignant neoplasm of unspecified site of left female breast: Secondary | ICD-10-CM

## 2011-01-20 DIAGNOSIS — R928 Other abnormal and inconclusive findings on diagnostic imaging of breast: Secondary | ICD-10-CM

## 2011-01-22 ENCOUNTER — Ambulatory Visit: Payer: BC Managed Care – PPO | Admitting: Family Medicine

## 2011-01-22 ENCOUNTER — Telehealth: Payer: Self-pay | Admitting: *Deleted

## 2011-01-22 ENCOUNTER — Other Ambulatory Visit: Payer: Self-pay | Admitting: *Deleted

## 2011-01-22 DIAGNOSIS — D0510 Intraductal carcinoma in situ of unspecified breast: Secondary | ICD-10-CM

## 2011-01-22 NOTE — Telephone Encounter (Signed)
Confirmed [facility] for 01/27/11 at 0815.  Instructions and contact information given.  

## 2011-01-24 ENCOUNTER — Ambulatory Visit
Admission: RE | Admit: 2011-01-24 | Discharge: 2011-01-24 | Disposition: A | Discharge status: Home / Self Care | Payer: BC Managed Care – PPO | Source: Ambulatory Visit | Attending: Family Medicine | Admitting: Family Medicine

## 2011-01-24 DIAGNOSIS — C50912 Malignant neoplasm of unspecified site of left female breast: Secondary | ICD-10-CM

## 2011-01-24 MED ORDER — GADOBENATE DIMEGLUMINE 529 MG/ML IV SOLN
18.0000 mL | Freq: Once | INTRAVENOUS | Status: AC | PRN
  Administered 2011-01-24: 18 mL via INTRAVENOUS

## 2011-01-27 ENCOUNTER — Encounter: Payer: Self-pay | Admitting: Radiation Oncology

## 2011-01-27 ENCOUNTER — Other Ambulatory Visit (HOSPITAL_BASED_OUTPATIENT_CLINIC_OR_DEPARTMENT_OTHER): Payer: BC Managed Care – PPO | Admitting: Lab

## 2011-01-27 ENCOUNTER — Ambulatory Visit (HOSPITAL_BASED_OUTPATIENT_CLINIC_OR_DEPARTMENT_OTHER): Payer: BC Managed Care – PPO | Admitting: Oncology

## 2011-01-27 ENCOUNTER — Ambulatory Visit: Payer: BC Managed Care – PPO | Attending: Surgery | Admitting: Physical Therapy

## 2011-01-27 ENCOUNTER — Encounter: Payer: Self-pay | Admitting: *Deleted

## 2011-01-27 ENCOUNTER — Encounter (INDEPENDENT_AMBULATORY_CARE_PROVIDER_SITE_OTHER): Payer: Self-pay | Admitting: Surgery

## 2011-01-27 ENCOUNTER — Ambulatory Visit: Payer: BC Managed Care – PPO

## 2011-01-27 ENCOUNTER — Ambulatory Visit
Admission: RE | Admit: 2011-01-27 | Discharge: 2011-01-27 | Disposition: A | Discharge status: Home / Self Care | Payer: BC Managed Care – PPO | Source: Ambulatory Visit | Attending: Radiation Oncology | Admitting: Radiation Oncology

## 2011-01-27 ENCOUNTER — Ambulatory Visit (HOSPITAL_BASED_OUTPATIENT_CLINIC_OR_DEPARTMENT_OTHER): Payer: BC Managed Care – PPO | Admitting: Surgery

## 2011-01-27 VITALS — BP 137/84 | HR 62 | Temp 97.3°F | Ht 64.0 in | Wt 195.8 lb

## 2011-01-27 DIAGNOSIS — Z170 Estrogen receptor positive status [ER+]: Secondary | ICD-10-CM

## 2011-01-27 DIAGNOSIS — D0590 Unspecified type of carcinoma in situ of unspecified breast: Secondary | ICD-10-CM

## 2011-01-27 DIAGNOSIS — IMO0001 Reserved for inherently not codable concepts without codable children: Secondary | ICD-10-CM | POA: Insufficient documentation

## 2011-01-27 DIAGNOSIS — D0510 Intraductal carcinoma in situ of unspecified breast: Secondary | ICD-10-CM | POA: Insufficient documentation

## 2011-01-27 DIAGNOSIS — C50419 Malignant neoplasm of upper-outer quadrant of unspecified female breast: Secondary | ICD-10-CM

## 2011-01-27 DIAGNOSIS — C50919 Malignant neoplasm of unspecified site of unspecified female breast: Secondary | ICD-10-CM | POA: Insufficient documentation

## 2011-01-27 HISTORY — DX: Malignant neoplasm of upper-outer quadrant of unspecified female breast: C50.419

## 2011-01-27 HISTORY — DX: Unspecified malignant neoplasm of skin, unspecified: C44.90

## 2011-01-27 LAB — CBC WITH DIFFERENTIAL/PLATELET
BASO%: 0.4 % (ref 0.0–2.0)
EOS%: 4 % (ref 0.0–7.0)
LYMPH%: 30.4 % (ref 14.0–49.7)
MCHC: 33.5 g/dL (ref 31.5–36.0)
MCV: 94.8 fL (ref 79.5–101.0)
MONO%: 11.9 % (ref 0.0–14.0)
Platelets: 183 10*3/uL (ref 145–400)
RBC: 4.57 10*6/uL (ref 3.70–5.45)
RDW: 13.8 % (ref 11.2–14.5)
WBC: 4.3 10*3/uL (ref 3.9–10.3)

## 2011-01-27 LAB — COMPREHENSIVE METABOLIC PANEL
ALT: 25 U/L (ref 0–35)
AST: 22 U/L (ref 0–37)
Alkaline Phosphatase: 88 U/L (ref 39–117)
Sodium: 140 mEq/L (ref 135–145)
Total Bilirubin: 0.3 mg/dL (ref 0.3–1.2)
Total Protein: 7.3 g/dL (ref 6.0–8.3)

## 2011-01-27 NOTE — Progress Notes (Signed)
CC:   Abigail Miyamoto, M.D. Drue Second, M.D. Nani Gasser, M.D.  DIAGNOSIS:  Ductal carcinoma in situ of the left breast.  PREVIOUS INTERVENTIONS:  Core needle biopsy on 01/18/2011 revealing DCIS versus ADH, ER/PR positive.  HISTORY OF PRESENT ILLNESS:  Mrs. Contino is a pleasant 55 year old female who presented with calcifications on a screening mammogram.  These were present in the upper outer quadrant and were not associated with any breast pain or nipple discharge.  She had 15 mm lesion on mammogram.  An MRI just showed post biopsy change.  The other breast was negative.  No enlarged axillary lymph nodes were noted.  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  This is ER/PR positive.  She presents today for my opinion regarding radiation in the management of her newly diagnosed breast cancer.  Past medical history, surgical history, medications and allergies as well as family history were reviewed.  They are documented in her inpatient chart.  GYN HISTORY:  Includes 1st period at 54.  She underwent menopause 3 years ago.  She has not been on hormone replacement therapy.  She is GX, P1.  REVIEW OF SYSTEMS:  Positive for night sweats, loss of sleep, fatigue, glasses, irritable bowel syndrome, skin cancer, arthritis, seizures, headaches, forgetfulness, anxiety, hot flashes and fibromyalgia.  All other systems reviewed and found to be negative.  PHYSICAL EXAMINATION:  Vital signs:  Her weight is 195 pounds, she is 5 feet 4 inches tall.  Blood pressure 137/84, pulse 62, respirations 20 and temperature 97.3.  She has no palpable cervical or supraclavicular adenopathy.  Has no palpable axillary adenopathy bilaterally.  Her breasts are ptotic and pendulous bilaterally.  She has no palpable abnormalities of the right breast.  On the left breast in the upper outer quadrant there is biopsy change associated with the biopsy site. She is alert and oriented  x3.  IMPRESSION:  Ductal carcinoma in situ of the left breast status post biopsy.  RECOMMENDATIONS:  I spoke with Mrs. Hurd and her husband today regarding the role of radiation in decreasing local failure in patients who undergo lumpectomy for DCIS.  She does still have genetics pending, and based on those tests, given that her mother had breast cancer in her 43s and 5s, we may recommend bilateral mastectomies.  If she does chose for breast conservation, we discussed the role of radiation in decreasing local failure.  I discussed 33 treatments as an outpatient. We discussed process simulation and placement of tattoos.  We discussed possible side effects of treatment including but not limited to fatigue and skin redness.  We discussed that I would see her back and start radiation 2 to 4 weeks after surgery.    ______________________________ Lurline Hare, M.D. SW/MEDQ  D:  01/27/2011  T:  01/27/2011  Job:  [id number]

## 2011-01-27 NOTE — Progress Notes (Signed)
Mailed after appt letter to pt. 

## 2011-01-27 NOTE — Patient Instructions (Addendum)
Breast Cancer, Early Stage, Surgery Choices Breast cancer is the most common cancer, except for skin cancer. It is the second most common cause of death, except for lung cancer, in women. The risk of a woman developing breast cancer is 12.5%. Breast cancer in women younger than 55 years old is rare. Most of these cancer cases have spread to the breast from another part of the body (metastatic). Finding out that you have breast cancer is an emotional shock and is difficult to understand, because it is an overwhelming, serious, and life-threatening disease. You will need to make important decisions about how you want it treated.  As a woman with early-stage breast cancer (DCIS or Stage I, IIA, IIB, IIIA, or IV) you may be able to choose which type of breast surgery to have. Your caregiver will help you understand what stage you are in. Often, your choices are:  Removal of the cancerous part(s) of the breast (breast-sparing surgery).   Lumpectomy.   Partial/Segmental mastectomy.   Removal of the whole breast (simple mastectomy). Research shows that women with early-stage breast cancer who have breast-sparing surgery along with radiation therapy live as long as those who have a mastectomy. Most women with breast cancer will lead long, healthy lives after treatment.   Reconstructive surgery. This type of surgery is to make the breast and nipple area as normal looking as it was before the mastectomy. It is usually done by a plastic surgeon at the same time as the mastectomy. There are several types of reconstructive surgery that should be discussed with your caregiver and plastic surgeon.   Lymph node surgery.   Sentinel (removing those lymph nodes in the armpit that breast cancer spreads to first).   Axillary lymph node dissection (removing all the lymph nodes in the armpit).  TREATMENT Treatment for breast cancer usually begins a few weeks after diagnosis. In these weeks, you should meet with a  surgeon, learn the facts about your surgery choices, treatment options, get a second opinion, and think about what is important to you.  Treatment choices include:  Surgery.   Radiation.   Chemotherapy.   Medicines, hormones.   Reconstructive breast surgery.   Any combination of the above treatments.  Choose which kind of surgery to have. If radiation, chemotherapy, or hormone therapy is considered, this also should be discussed before the surgery. Radical breast surgery is rarely performed now. Usually, lumpectomy, partial/segmental mastectomy, simple mastectomy in combination with radiation, chemotherapy, and/or hormone treatment is recommended. Clinical trial protocols (specific treatment plan with surgery and radiation, chemotherapy, and hormones) for breast cancer have been put in place in hospitals, universities, medical schools, and cancer centers all over the country. These patients are followed for several years to find out what treatment gives the best results for the protocol given, for the different stages of breast cancer.  Most women want to make this choice with help from their caregiver. After all, the kind of surgery you have will affect how you look and feel. But it is often hard to decide what to do. The more information you have, the better the decision you can make.  Talk to a surgeon about your breast cancer surgery choices. Find out what happens during surgery, types of problems that sometimes occur, and other kinds of treatment (if any) you will need after surgery. Be sure to ask a lot of questions and learn as much as you can. You may also wish to talk with family members, friends, or  others who have had breast cancer surgery, radiation therapy, chemotherapy, or hormone therapy.   After talking with a surgeon, you may want a second opinion. This means talking with another doctor or surgeon who might tell you about other treatment options. They may simply give you  information that can help you feel better about the choice you are making. Do not worry about hurting your surgeon's feelings. It is common practice to get a second opinion, and some insurance companies require it. Plus, it is better to get a second opinion than to worry that you have made the wrong choice.  STAGES OF BREAST CANCER Staging of breast cancer depends on the size of the tumor, if and how far it has spread, lymph node involvement in the armpits, and whether it has spread to other parts of the body. If you are unsure of the stage of your cancer, ask your caregiver. The following are the stages of breast cancer:  Stage 0: This means you either have DCIS or LCIS.   DCIS (Ductal Carcinoma in Situ) is very early breast cancer. It is often too small to form a lump. Your caregiver may refer to DCIS as noninvasive cancer.   LCIS (Lobular Carcinoma in Situ) is not cancer, but it may increase the chance that you will get breast cancer. Talk with your caregiver about treatment options, if you are diagnosed with LCIS.   The 5 year survival rate in this stage is almost 100%.   Stage I:   Your cancer is less than 1 inch across (2 centimeters) or about the size of a quarter. The cancer is only in the breast. It has not spread to lymph nodes or other parts of your body.   Stage IIA:   No cancer is found in your breast. However, cancer is found in the lymph nodes under your arm, or   Your cancer is 1 inch (2 centimeters) or smaller. It has spread to 3 lymph nodes or less (the lymph nodes under your arm), or   Your cancer is about 1-2 inches (2-5 centimeters). It has not spread to the lymph nodes under your arm.   Stage IIB:   Your cancer is about 1-2 inches (2-5 centimeters). It has spread to the lymph nodes under your arm, or   Your cancer is larger than 2 inches (5 centimeters). It has not spread to the lymph nodes under your arm.   The 5 year survival rate is 85 to 90%.   Stage IIIA:     No cancer is found in the breast. It is found in lymph nodes under your arm. The lymph nodes are attached to each other, or   Your cancer is 2 inches (5 centimeters) or smaller. It has spread to lymph nodes under your arm. The lymph nodes are attached to each other, or   Your cancer is larger than 2 inches (5 centimeters) and has spread to lymph nodes under your arm, and they are not attached to each other.   Your cancer is smaller than 2 inches (5 centimeters) and has spread to lymph nodes above your collarbone.   Inflammatory cancer of the breast (red rash, tender and swollen breasts) is in the stage III category.   The 5 year survival rate is 45 to 67%.   Stage IV:   Cancer cells have spread to other parts of the body.   The 5 year survival rate is about 20%.  ABOUT LYMPH NODES  Lymph nodes  are part of your body's immune system, which helps fight infection and disease. Lymph nodes are small, round, and clustered (like a bunch of grapes) throughout your body.   Axillary lymph nodes are in the area under your arm. Breast cancer may spread to these lymph nodes first, even when the tumor in the breast is small. This is why most surgeons take out some of these lymph nodes at the time of breast surgery.   Lymphedema is swelling caused by a buildup of lymph fluid. You may have this type of swelling in your arm, if your lymph nodes are taken out with surgery or damaged by radiation therapy.   Lymphedema can show up soon after surgery. The symptoms are often mild and last for a short time.   Lymphedema can show up months or even years after cancer treatment is over. Often, lymphedema develops after an insect bite, minor injury, or burn on the arm where your lymph nodes were removed. Sometimes, this can be painful. One way to reduce the swelling is to work with a caregiver who specializes in rehabilitation, or with a physical therapist.   Sentinel lymph node biopsy is surgery to remove as  few lymph nodes as possible from under the arm. The surgeon first injects a dye in the breast to see which lymph nodes the breast tumor drains into. Then, he or she removes these nodes to see if they have any cancer. If there is no cancer, the surgeon may leave the other lymph nodes in place. This surgery is fairly new and is being studied experimentally. Talk with your surgeon if you want to learn more.  COMPARE YOUR CHOICES  BREAST-SPARING SURGERY  Is this surgery right for me?   Breast-sparing surgery with radiation is a safe choice for most women who have early-stage breast cancer. This means that your cancer is DCIS or at Stage I, IIA, IIB, or IIIA.  What are the names of the different kinds of surgery?  Lumpectomy.   Partial mastectomy.   Breast-sparing surgery.   Segmental mastectomy.   Simple mastectomy.   Sentinel lymph node removal.   Axillary lymph node excision.  What doctors am I likely to see?  Oncologist.   Surgeon.   Radiation Oncologist.   Chemotherapy Oncologist.  What will my breast look like after surgery?  Your breast should look a lot like it did before surgery. But if your tumor is large, your breast may look different or smaller after breast-sparing surgery.  Will I have feeling in the area around my breast?  Yes. You should still have feeling in your breast, nipple, and areola (dark area around your nipple).  Will I have pain after the surgery?  You may have pain after surgery. Talk with your caregiver about ways to control this pain.  What other problems can I expect?  You may feel very tired after radiation therapy. You may get swelling in your arm (lymphedema).  Will I need more surgery?  Maybe. You may need more surgery to remove lymph nodes from under your arm. Also, if the surgeon does not remove all your cancer the first time, you may need more surgery.  What other types of treatment will I need?  You may need radiation therapy, given  almost every day for 5 to 8 weeks. You may also need chemotherapy, hormone therapy, or both.  Will insurance pay for my surgery?  Check with your insurance company to find out how much it pays for  breast cancer surgery and other needed treatments.  Will the type of surgery I have affect how long I live?  Women with early-stage breast cancer who have breast-sparing surgery followed by radiation live just as long as women who have a mastectomy. Most women with breast cancer will lead long, healthy lives after treatment.  What are the chances that my cancer will come back after surgery?  About 10% of women who have breast-sparing surgery along with radiation therapy get cancer in the same breast within 12 years. If this happens, you will need a mastectomy, but it will not affect how long you live.  MASTECTOMY SURGERY Is this surgery right for me?   Mastectomy is a safe choice for women who have early-stage breast cancer (DCIS, Stage I, IIA, IIB, or IIIA). You may need a mastectomy if:   You have small breasts and a large tumor.   You have cancer in more than one part of your breast.   The tumor is under the nipple.   You do not have access to radiation therapy.  What are the names of the different kinds of surgery?  Total mastectomy.   Modified radical mastectomy (not usually done now).   Double mastectomy.   Simple mastectomy.  What doctors am I likely to see?  Oncologist.   Surgeon.   Radiation Oncologist.   Chemotherapy Oncologist.  What will my breast look like after surgery?  Your breast and nipple will be removed. You will have a flat chest on the side of your body where the breast was removed.  Will I have feeling in the area around my breast?  Maybe. After surgery, you will have no feeling (numb) in your chest wall and maybe also under your arm. This numb feeling should go away in 1-2 years, but it will never feel like it did before. Also, the skin where your breast  was may feel tight.  Will I have pain after the surgery?  You may have pain after surgery. Talk with your caregiver about ways to control this pain.  What other problems can I expect?  You may have pain in your neck or back. You may feel out of balance, if you had large breasts and do not have reconstruction surgery. You may get swelling in your arm (lymphedema).  Will I need more surgery?  Maybe. You may need surgery to remove lymph nodes from under your arm. Also, if you have problems after your mastectomy, you may need to see your surgeon for treatment.  What other types of treatment will I need?  You may also need chemotherapy, hormone therapy, or radiation therapy. Some women get all 3 types of therapy or any combination of the 3.  Will insurance pay for my surgery?  Check with your insurance company to find out how much it pays for breast cancer surgery and other needed treatments.  Will the type of surgery I have affect how long I live?  Women with early-stage breast cancer who have a mastectomy live as long as women who have breast-sparing surgery followed by radiation therapy. Most women with breast cancer will lead long, healthy lives after treatment.  What are the chances that my cancer will come back after surgery?  About 5% of women who have a mastectomy will get cancer on the same side of their chest within 12 years.  MASTECTOMY AND BREAST RECONSTRUCTION SURGERY  Is this surgery right for me?  If you have a mastectomy, you might  also want breast reconstruction surgery.   You can choose to have reconstruction surgery at the same time as your mastectomy.   You can wait and have it at a later date.  What are the names of the different kinds of surgery?  Breast implant.   Tissue flap surgery.   Reconstructive plastic surgery.  What doctors am I likely to see?  Oncologist.   Surgeon.   Radiation Oncologist.   Reconstructive Plastic Surgeon.   Chemotherapy  Oncologist.  What will my breast look like after surgery?  Although you will have a breast-like shape, your breast will not look the same as it did before surgery.  Will I have feeling in the area around my breast?  No. The area around your breast will always be numb (have no feeling).  Will I have pain after the surgery?  You are likely to have pain after major surgery, such as mastectomy and reconstruction surgery. There are many ways to deal with pain. Let your caregiver know if you need relief from pain.  What other problems can I expect?  It may take you many weeks or even months to recover from breast reconstruction surgery. If you have an implant, you may get infections, pain, or hardness. Also, you may not like how your breast-like shape looks. You may need more surgery if your implant breaks or leaks. If you have tissue flap surgery, you may lose strength in the part of your body where the flap came from. You may get swelling in your arm (lymphedema).  Will I need more surgery?  Yes. You will need surgery at least 2 more times to build a new breast-like shape. With implants, you may need more surgery months or years later. You may also need surgery to remove lymph nodes from under your arm.  What other types of treatment will I need?  You may need chemotherapy, hormone therapy, or radiation therapy. Some women get all 3 types of therapy or any combination of the 3.  Will insurance pay for my surgery?  Check with your insurance company to find out if it pays for breast reconstruction surgery. You should also ask if your insurance will pay for problems that may result from breast reconstruction surgery.  Will the type of surgery I have affect how long I live?  Women with early-stage breast cancer who have a mastectomy live the same amount of time as women who have breast-sparing surgery followed by radiation therapy. Most women with breast cancer will lead long, healthy lives after  treatment.  What are the chances that my cancer will come back after surgery?  About 5% of women who have a mastectomy will get cancer on the same side of their chest within 12 years. Breast reconstruction surgery does not affect the chances of your cancer coming back.  Where can I learn more about coping with life after cancer? To learn more about life after cancer, you might want to read "Facing Forward: Life After Cancer Treatment." You can get this booklet at ReviewTheMovie.co.za or by calling 1-800-4-CANCER. THINK ABOUT WHAT IS IMPORTANT TO YOU   After you have talked with your surgeon and learned the facts, you may also want to talk with your spouse or partner, family, friends, or other women who have had breast cancer surgery. The more you know about breast cancer, the treatment, and what happens afterward, the more informed you will be and the easier it will be for you to make good decisions  that are important to you.   Think about what is important to you. Here are some questions to think about:   Do I want to get a second opinion?   How important is it to me how my breast looks after cancer surgery?   How important is it to me how my breast feels after cancer surgery?   If I have breast-sparing surgery or more extensive surgery, am I willing to get radiation, hormone and/or chemotherapy?   If I have a mastectomy, do I also want breast reconstruction surgery?   If I have breast reconstruction surgery, do I want it at the same time as my mastectomy?   What treatment does my insurance cover, and what do I need to pay for?   Who would I like to talk with about my surgery, radiation, hormone, and chemotherapy choices?   What else do I want to know, do, or learn before I make my choice about breast cancer surgery?   After I have learned all I could and have talked with my surgeon, I will make a choice that feels right for me.   A patient who takes the time to be  well-informed will feel more comfortable about her decisions regarding surgery, and will feel better about the procedure following the surgery.   Coping and Support:   Be open and willing to talk to your family and friends about your cancer. Family and friends can be your best support group, to help you work through your concerns and worries.   Discussing your thoughts, concerns, and intimacy issues with your spouse or partner is very important and helpful.   Join support groups to share and learn how others with breast cancer cope and deal with their cancer. The [employer] can help you find support groups in your area.   Breast cancer may affect your confidence and feelings about being a total woman. It may interfere with your intimate relationship with your partner. Counseling may be necessary to help you overcome these feelings.   Talk to a counselor, your clergyperson, psychologist, or psychiatrist.   Talk to a medical social worker, if you have financial questions or problems.   Do not be afraid to ask for help, especially during your treatment.   Learn to be as independent as possible, as soon as possible.  Document Released: 04/24/2003 Document Revised: 10/14/2010 Document Reviewed: 12/30/2008 [facility] Patient Information 2012 [place], [place].  Tamoxifen oral tablet What is this medicine? TAMOXIFEN (ta MOX i fen) blocks the effects of estrogen. It is commonly used to treat breast cancer. It is also used to decrease the chance of breast cancer coming back in women who have received treatment for the disease. It may also help prevent breast cancer in women who have a high risk of developing breast cancer. This medicine may be used for other purposes; ask your health care provider or pharmacist if you have questions. What should I tell my health care provider before I take this medicine? They need to know if you have any of these conditions: -blood clots -blood  disease -cataracts or impaired eyesight -endometriosis -high calcium levels -high cholesterol -irregular menstrual cycles -liver disease -stroke -uterine fibroids -an unusual or allergic reaction to tamoxifen, other medicines, foods, dyes, or preservatives -pregnant or trying to get pregnant -breast-feeding How should I use this medicine? Take this medicine by mouth with a glass of water. Follow the directions on the prescription label. You can take it with or  without food. Take your medicine at regular intervals. Do not take your medicine more often than directed. Do not stop taking except on your doctor's advice. A special MedGuide will be given to you by the pharmacist with each prescription and refill. Be sure to read this information carefully each time. Talk to your pediatrician regarding the use of this medicine in children. While this drug may be prescribed for selected conditions, precautions do apply. Overdosage: If you think you have taken too much of this medicine contact a poison control center or emergency room at once. NOTE: This medicine is only for you. Do not share this medicine with others. What if I miss a dose? If you miss a dose, take it as soon as you can. If it is almost time for your next dose, take only that dose. Do not take double or extra doses. What may interact with this medicine? -aminoglutethimide -bromocriptine -chemotherapy drugs -female hormones, like estrogens and birth control pills -letrozole -medroxyprogesterone -phenobarbital -rifampin -warfarin This list may not describe all possible interactions. Give your health care provider a list of all the medicines, herbs, non-prescription drugs, or dietary supplements you use. Also tell them if you smoke, drink alcohol, or use illegal drugs. Some items may interact with your medicine. What should I watch for while using this medicine? Visit your doctor or health care professional for regular checks on  your progress. You will need regular pelvic exams, breast exams, and mammograms. If you are taking this medicine to reduce your risk of getting breast cancer, you should know that this medicine does not prevent all types of breast cancer. If breast cancer or other problems occur, there is no guarantee that it will be found at an early stage. Do not become pregnant while taking this medicine or for 2 months after stopping this medicine. Stop taking this medicine if you get pregnant or think you are pregnant and contact your doctor. This medicine may harm your unborn baby. Women who can possibly become pregnant should use birth control methods that do not use hormones during tamoxifen treatment and for 2 months after therapy has stopped. Talk with your health care provider for birth control advice. Do not breast feed while taking this medicine. What side effects may I notice from receiving this medicine? Side effects that you should report to your doctor or health care professional as soon as possible: -changes in vision (blurred vision) -changes in your menstrual cycle -difficulty breathing or shortness of breath -difficulty walking or talking -new breast lumps -numbness -pelvic pain or pressure -redness, blistering, peeling or loosening of the skin, including inside the mouth -skin rash or itching (hives) -sudden chest pain -swelling of lips, face, or tongue -swelling, pain or tenderness in your calf or leg -unusual bruising or bleeding -vaginal discharge that is bloody, brown, or rust -weakness -yellowing of the whites of the eyes or skin Side effects that usually do not require medical attention (report to your doctor or health care professional if they continue or are bothersome): -fatigue -hair loss, although uncommon and is usually mild -headache -hot flashes -impotence (in men) -nausea, vomiting (mild) -vaginal discharge (white or clear) This list may not describe all possible side  effects. Call your doctor for medical advice about side effects. You may report side effects to FDA at 1-800-FDA-1088. Where should I keep my medicine? Keep out of the reach of children. Store at room temperature between 20 and 25 degrees C (68 and 77 degrees F). Protect from  light. Keep container tightly closed. Throw away any unused medicine after the expiration date. NOTE: This sheet is a summary. It may not cover all possible information. If you have questions about this medicine, talk to your doctor, pharmacist, or health care provider.  2012, [employer]. (10/19/2007 12:01:56 PM)  Exemestane tablets What is this medicine? EXEMESTANE (ex e MES tane) blocks the production of the hormone estrogen. Some types of breast cancer depend on estrogen to grow, and this medicine can stop tumor growth by blocking estrogen production. This medicine is for the treatment of breast cancer in postmenopausal women only. This medicine may be used for other purposes; ask your health care provider or pharmacist if you have questions. What should I tell my health care provider before I take this medicine? They need to know if you have any of these conditions: -an unusual or allergic reaction to exemestane, other medicines, foods, dyes, or preservatives -pregnant or trying to get pregnant -breast-feeding How should I use this medicine? Take this medicine by mouth with a glass of water. Follow the directions on the prescription label. Take your doses at regular intervals after a meal. Do not take your medicine more often than directed. Do not stop taking except on the advice of your doctor or health care professional. Contact your pediatrician regarding the use of this medicine in children. Special care may be needed. Overdosage: If you think you have taken too much of this medicine contact a poison control center or emergency room at once. NOTE: This medicine is only for you. Do not share this medicine  with others. What if I miss a dose? If you miss a dose, take the next dose as usual. Do not try to make up the missed dose. Do not take double or extra doses. What may interact with this medicine? Do not take this medicine with any of the following medications: -female hormones, like estrogens and birth control pills This medicine may also interact with the following medications: -androstenedione -phenytoin -rifabutin, rifampin, or rifapentine -St. John's Wort This list may not describe all possible interactions. Give your health care provider a list of all the medicines, herbs, non-prescription drugs, or dietary supplements you use. Also tell them if you smoke, drink alcohol, or use illegal drugs. Some items may interact with your medicine. What should I watch for while using this medicine? Visit your doctor or health care professional for regular checks on your progress. If you experience hot flashes or sweating while taking this medicine, avoid alcohol, smoking and drinks with caffeine. This may help to decrease these side effects. What side effects may I notice from receiving this medicine? Side effects that you should report to your doctor or health care professional as soon as possible: -any new or unusual symptoms -changes in vision -fever -leg or arm swelling -pain in bones, joints, or muscles -pain in hips, back, ribs, arms, shoulders, or legs Side effects that usually do not require medical attention (report to your doctor or health care professional if they continue or are bothersome): -difficulty sleeping -headache -hot flashes -sweating -unusually weak or tired This list may not describe all possible side effects. Call your doctor for medical advice about side effects. You may report side effects to FDA at 1-800-FDA-1088. Where should I keep my medicine? Keep out of the reach of children. Store at room temperature between 15 and 30 degrees C (59 and 86 degrees F). Throw  away any unused medicine after the expiration date. NOTE: This  sheet is a summary. It may not cover all possible information. If you have questions about this medicine, talk to your doctor, pharmacist, or health care provider.  2012, [employer]. (06/06/2007 11:48:29 AM) Lab Results  Component Value Date   WBC 4.3 01/27/2011   HGB 14.5 01/27/2011   HCT 43.3 01/27/2011   MCV 94.8 01/27/2011   PLT 183 01/27/2011
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
## 2011-01-28 ENCOUNTER — Encounter: Payer: Self-pay | Admitting: *Deleted

## 2011-01-28 ENCOUNTER — Encounter (HOSPITAL_COMMUNITY): Payer: Self-pay | Admitting: Pharmacy Technician

## 2011-01-28 NOTE — Progress Notes (Signed)
CSW met with pt at [facility].  CSW gave pt information on the supportive services and programs at [facility], and provided pt with contact informtion and a Patient and Family Support calendar.  Pt stated her mother was a breast cancer survivor, but she was not able to join her at clinic.  Pt stated she would have appreciated her mothers support today at clinic, and expressed the need for her family's support.  CSW validated the pt's feelings and offered additional support.  CSW encouraged the pt to consider the support programs and groups at [facility].  CSW also offered the opportunity for counseling, and encouraged the pt to contact CSW with any needs or concerns.    Tamala Julian, LCSW

## 2011-02-01 ENCOUNTER — Ambulatory Visit: Payer: BC Managed Care – PPO

## 2011-02-01 NOTE — Progress Notes (Signed)
Patient seen for genetic counseling. Blood sent to [facility] for BRCA1/2 testing. TAT 2 weeks.  °

## 2011-02-02 ENCOUNTER — Encounter (HOSPITAL_COMMUNITY): Payer: Self-pay

## 2011-02-02 ENCOUNTER — Other Ambulatory Visit (INDEPENDENT_AMBULATORY_CARE_PROVIDER_SITE_OTHER): Payer: Self-pay | Admitting: Surgery

## 2011-02-02 ENCOUNTER — Telehealth: Payer: Self-pay | Admitting: *Deleted

## 2011-02-02 ENCOUNTER — Encounter (HOSPITAL_COMMUNITY)
Admission: RE | Admit: 2011-02-02 | Discharge: 2011-02-02 | Disposition: A | Discharge status: Home / Self Care | Payer: BC Managed Care – PPO | Source: Ambulatory Visit | Attending: Surgery | Admitting: Surgery

## 2011-02-02 DIAGNOSIS — D0510 Intraductal carcinoma in situ of unspecified breast: Secondary | ICD-10-CM

## 2011-02-02 HISTORY — DX: Sleep apnea, unspecified: G47.30

## 2011-02-02 HISTORY — DX: Myoclonus: G25.3

## 2011-02-02 HISTORY — DX: Nausea with vomiting, unspecified: R11.2

## 2011-02-02 HISTORY — DX: Other specified postprocedural states: R11.2

## 2011-02-02 HISTORY — DX: Other specified postprocedural states: Z98.890

## 2011-02-02 LAB — CBC
HCT: 43.2 % (ref 36.0–46.0)
Hemoglobin: 14.5 g/dL (ref 12.0–15.0)
RBC: 4.58 MIL/uL (ref 3.87–5.11)
WBC: 5.4 10*3/uL (ref 4.0–10.5)

## 2011-02-02 LAB — COMPREHENSIVE METABOLIC PANEL
Alkaline Phosphatase: 103 U/L (ref 39–117)
BUN: 13 mg/dL (ref 6–23)
Chloride: 103 mEq/L (ref 96–112)
GFR calc Af Amer: 80 mL/min — ABNORMAL LOW (ref >90)
Glucose, Bld: 99 mg/dL (ref 70–99)
Potassium: 5 mEq/L (ref 3.5–5.1)
Total Bilirubin: 0.3 mg/dL (ref 0.3–1.2)

## 2011-02-02 LAB — SURGICAL PCR SCREEN: MRSA, PCR: NEGATIVE

## 2011-02-02 NOTE — Pre-Procedure Instructions (Signed)
20 Margaret Beasley  02/02/2011   Your procedure is scheduled on:  Wednesday, December 26 TH  Report to [facility] at 10:00 AM.  Call this number if you have problems the morning of surgery: [phone]   Remember:   Do not eat food:After Midnight Tuesday  May have clear liquids: up to 4 Hours before arrival time-- 6:00 AM.  Clear liquids include soda, tea, black coffee, apple or grape juice, broth.  Take these medicines the morning of surgery with A SIP OF WATER: keppra, lamictal   Do not wear jewelry, make-up or nail polish.  Do not wear lotions, powders, or perfumes. You may wear deodorant.  Do not shave 48 hours prior to surgery.   Do not bring valuables to the hospital.  Contacts, dentures or bridgework may not be worn into surgery.  Leave suitcase in the car. After surgery it may be brought to your room.  For patients admitted to the hospital, checkout time is 11:00 AM the day of discharge.   Patients discharged the day of surgery will not be allowed to drive home.  Name and phone number of your driver:  Margaret Beasley  -  SPOUSE  Special Instructions: CHG Shower Use Special Wash: 1/2 bottle night before surgery and 1/2 bottle morning of surgery.   Please read over the following fact sheets that you were given: Pain Booklet, MRSA Information and Surgical Site Infection Prevention

## 2011-02-02 NOTE — Progress Notes (Addendum)
PATIENT IS VERY CONCERNED ABOUT NAUSEA & VOMITING....WHEN SHE HAD HER APPENDIX OUT, HAD TOLD THE NURSE THAT ZOFRAN  DID NOT  WORK.....WAS GIVEN ZOFRAN AND SINCE IT DIDN'T HELP,   SHE VOMITED AND GOT A HEADACHE....ALMOST LED HER INTO A SEIZURE........   P L E A S E      D O   N  O T    U S E   Z  O  F  R  A  N!!!!!  AND MAKE SURE SHE DOESN'T VOMIT!!!   SHE IS REALLY APPREHENSIVE ABOUT THIS ISSUE  PLEASE  USE  IMITREX AND PHENERGAN

## 2011-02-02 NOTE — Progress Notes (Deleted)
PLEASE USE IMITREX AND PHENERGAN  !!!!!!!!!!!!!!!!!!!!!!!!!!!!!

## 2011-02-02 NOTE — Telephone Encounter (Signed)
Attempted to call pt to follow up from [facility].  Received a message stating pt has not set up their voicemail.  Will attempt to reach pt again.

## 2011-02-05 ENCOUNTER — Telehealth: Payer: Self-pay | Admitting: Genetic Counselor

## 2011-02-09 MED ORDER — CEFAZOLIN SODIUM-DEXTROSE 2-3 GM-% IV SOLR
2.0000 g | INTRAVENOUS | Status: AC
  Administered 2011-02-10: 2 g via INTRAVENOUS
  Filled 2011-02-09: qty 50

## 2011-02-10 ENCOUNTER — Ambulatory Visit
Admission: RE | Admit: 2011-02-10 | Discharge: 2011-02-10 | Disposition: A | Discharge status: Home / Self Care | Payer: BC Managed Care – PPO | Source: Ambulatory Visit | Attending: Surgery | Admitting: Surgery

## 2011-02-10 ENCOUNTER — Other Ambulatory Visit (INDEPENDENT_AMBULATORY_CARE_PROVIDER_SITE_OTHER): Payer: Self-pay | Admitting: Surgery

## 2011-02-10 ENCOUNTER — Encounter (HOSPITAL_COMMUNITY): Payer: Self-pay | Admitting: Certified Registered Nurse Anesthetist

## 2011-02-10 ENCOUNTER — Encounter (HOSPITAL_COMMUNITY): Payer: Self-pay | Admitting: *Deleted

## 2011-02-10 ENCOUNTER — Ambulatory Visit (HOSPITAL_COMMUNITY): Payer: BC Managed Care – PPO | Admitting: Certified Registered Nurse Anesthetist

## 2011-02-10 ENCOUNTER — Encounter (HOSPITAL_COMMUNITY)
Admission: RE | Disposition: A | Discharge status: Home / Self Care | Payer: Self-pay | Source: Ambulatory Visit | Attending: Surgery

## 2011-02-10 ENCOUNTER — Ambulatory Visit (HOSPITAL_COMMUNITY)
Admission: RE | Admit: 2011-02-10 | Discharge: 2011-02-10 | Disposition: A | Discharge status: Home / Self Care | Payer: BC Managed Care – PPO | Source: Ambulatory Visit | Attending: Surgery | Admitting: Surgery

## 2011-02-10 DIAGNOSIS — D0510 Intraductal carcinoma in situ of unspecified breast: Secondary | ICD-10-CM

## 2011-02-10 DIAGNOSIS — C50919 Malignant neoplasm of unspecified site of unspecified female breast: Secondary | ICD-10-CM

## 2011-02-10 DIAGNOSIS — Z170 Estrogen receptor positive status [ER+]: Secondary | ICD-10-CM | POA: Insufficient documentation

## 2011-02-10 DIAGNOSIS — D0590 Unspecified type of carcinoma in situ of unspecified breast: Secondary | ICD-10-CM | POA: Insufficient documentation

## 2011-02-10 HISTORY — PX: BREAST LUMPECTOMY: SHX2

## 2011-02-10 SURGERY — BREAST LUMPECTOMY
Anesthesia: Choice | Site: Breast | Laterality: Left | Wound class: Clean

## 2011-02-10 MED ORDER — BUPIVACAINE-EPINEPHRINE 0.25% -1:200000 IJ SOLN
INTRAMUSCULAR | Status: DC | PRN
  Administered 2011-02-10: 20 mL

## 2011-02-10 MED ORDER — PROMETHAZINE HCL 25 MG/ML IJ SOLN: 6.2500 mg | INTRAMUSCULAR | Status: DC | PRN

## 2011-02-10 MED ORDER — FENTANYL CITRATE 0.05 MG/ML IJ SOLN: 50.0000 ug | INTRAMUSCULAR | Status: DC | PRN

## 2011-02-10 MED ORDER — PROPOFOL 10 MG/ML IV BOLUS
INTRAVENOUS | Status: DC | PRN
  Administered 2011-02-10: 200 mg via INTRAVENOUS

## 2011-02-10 MED ORDER — SUMATRIPTAN SUCCINATE 6 MG/0.5ML ~~LOC~~ SOLN
6.0000 mg | Freq: Once | SUBCUTANEOUS | Status: DC | PRN
  Filled 2011-02-10: qty 0.5

## 2011-02-10 MED ORDER — KETOROLAC TROMETHAMINE 30 MG/ML IJ SOLN
INTRAMUSCULAR | Status: DC | PRN
  Administered 2011-02-10: 30 mg via INTRAVENOUS

## 2011-02-10 MED ORDER — OXYCODONE-ACETAMINOPHEN 5-325 MG PO TABS: ORAL_TABLET | ORAL | Status: AC

## 2011-02-10 MED ORDER — 0.9 % SODIUM CHLORIDE (POUR BTL) OPTIME
TOPICAL | Status: DC | PRN
  Administered 2011-02-10: 1000 mL

## 2011-02-10 MED ORDER — LACTATED RINGERS IV SOLN
INTRAVENOUS | Status: DC | PRN
  Administered 2011-02-10: 12:00:00 via INTRAVENOUS

## 2011-02-10 MED ORDER — SODIUM CHLORIDE 0.9 % IV SOLN: 250.0000 mL | INTRAVENOUS | Status: DC | PRN

## 2011-02-10 MED ORDER — FENTANYL CITRATE 0.05 MG/ML IJ SOLN
INTRAMUSCULAR | Status: DC | PRN
  Administered 2011-02-10 (×2): 50 ug via INTRAVENOUS

## 2011-02-10 MED ORDER — ONDANSETRON HCL 4 MG/2ML IJ SOLN
INTRAMUSCULAR | Status: DC | PRN
  Administered 2011-02-10: 4 mg via INTRAVENOUS

## 2011-02-10 MED ORDER — DEXAMETHASONE SODIUM PHOSPHATE 4 MG/ML IJ SOLN
INTRAMUSCULAR | Status: DC | PRN
  Administered 2011-02-10: 8 mg via INTRAVENOUS

## 2011-02-10 MED ORDER — MIDAZOLAM HCL 5 MG/5ML IJ SOLN
INTRAMUSCULAR | Status: DC | PRN
  Administered 2011-02-10: 2 mg via INTRAVENOUS

## 2011-02-10 MED ORDER — MIDAZOLAM HCL 2 MG/2ML IJ SOLN: 1.0000 mg | INTRAMUSCULAR | Status: DC | PRN

## 2011-02-10 SURGICAL SUPPLY — 47 items
BENZOIN TINCTURE PRP APPL 2/3 (GAUZE/BANDAGES/DRESSINGS) ×2 IMPLANT
BINDER BREAST LRG (GAUZE/BANDAGES/DRESSINGS) IMPLANT
BINDER BREAST XLRG (GAUZE/BANDAGES/DRESSINGS) IMPLANT
BLADE SURG 15 STRL LF DISP TIS (BLADE) IMPLANT
BLADE SURG 15 STRL SS (BLADE)
CANISTER SUCTION 2500CC (MISCELLANEOUS) ×2 IMPLANT
CHLORAPREP W/TINT 26ML (MISCELLANEOUS) ×2 IMPLANT
CLOTH BEACON ORANGE TIMEOUT ST (SAFETY) ×2 IMPLANT
CONT SPEC 4OZ CLIKSEAL STRL BL (MISCELLANEOUS) IMPLANT
COVER SURGICAL LIGHT HANDLE (MISCELLANEOUS) ×2 IMPLANT
DECANTER SPIKE VIAL GLASS SM (MISCELLANEOUS) IMPLANT
DERMABOND ADVANCED (GAUZE/BANDAGES/DRESSINGS)
DERMABOND ADVANCED .7 DNX12 (GAUZE/BANDAGES/DRESSINGS) IMPLANT
DEVICE DUBIN SPECIMEN MAMMOGRA (MISCELLANEOUS) ×2 IMPLANT
DRAPE PED LAPAROTOMY (DRAPES) ×2 IMPLANT
DRSG TEGADERM 4X4.75 (GAUZE/BANDAGES/DRESSINGS) ×2 IMPLANT
ELECT CAUTERY BLADE 6.4 (BLADE) ×2 IMPLANT
ELECT REM PT RETURN 9FT ADLT (ELECTROSURGICAL) ×2
ELECTRODE REM PT RTRN 9FT ADLT (ELECTROSURGICAL) ×1 IMPLANT
GAUZE SPONGE 4X4 12PLY STRL LF (GAUZE/BANDAGES/DRESSINGS) ×2 IMPLANT
GAUZE SPONGE 4X4 16PLY XRAY LF (GAUZE/BANDAGES/DRESSINGS) IMPLANT
GLOVE BIOGEL PI IND STRL 6.5 (GLOVE) ×1 IMPLANT
GLOVE BIOGEL PI INDICATOR 6.5 (GLOVE) ×1
GLOVE ECLIPSE 6.5 STRL STRAW (GLOVE) ×2 IMPLANT
GLOVE SURG SIGNA 7.5 PF LTX (GLOVE) ×2 IMPLANT
GLOVE SURG SS PI 6.0 STRL IVOR (GLOVE) ×2 IMPLANT
GOWN PREVENTION PLUS XLARGE (GOWN DISPOSABLE) ×2 IMPLANT
GOWN STRL NON-REIN LRG LVL3 (GOWN DISPOSABLE) ×2 IMPLANT
KIT BASIN OR (CUSTOM PROCEDURE TRAY) ×2 IMPLANT
KIT MARKER MARGIN INK (KITS) ×2 IMPLANT
KIT ROOM TURNOVER OR (KITS) ×2 IMPLANT
NEEDLE HYPO 25GX1X1/2 BEV (NEEDLE) ×2 IMPLANT
NS IRRIG 1000ML POUR BTL (IV SOLUTION) ×2 IMPLANT
PACK GENERAL/GYN (CUSTOM PROCEDURE TRAY) ×2 IMPLANT
PACK SURGICAL SETUP 50X90 (CUSTOM PROCEDURE TRAY) IMPLANT
PAD ARMBOARD 7.5X6 YLW CONV (MISCELLANEOUS) ×2 IMPLANT
PENCIL BUTTON HOLSTER BLD 10FT (ELECTRODE) IMPLANT
STRIP CLOSURE SKIN 1/2X4 (GAUZE/BANDAGES/DRESSINGS) ×2 IMPLANT
SUT MNCRL AB 4-0 PS2 18 (SUTURE) ×2 IMPLANT
SUT VIC AB 3-0 SH 27 (SUTURE) ×1
SUT VIC AB 3-0 SH 27X BRD (SUTURE) ×1 IMPLANT
SYR BULB 3OZ (MISCELLANEOUS) IMPLANT
SYR CONTROL 10ML LL (SYRINGE) ×2 IMPLANT
TOWEL OR 17X24 6PK STRL BLUE (TOWEL DISPOSABLE) ×2 IMPLANT
TOWEL OR 17X26 10 PK STRL BLUE (TOWEL DISPOSABLE) ×2 IMPLANT
TUBE CONNECTING 12X1/4 (SUCTIONS) IMPLANT
YANKAUER SUCT BULB TIP NO VENT (SUCTIONS) IMPLANT

## 2011-02-10 NOTE — Anesthesia Preprocedure Evaluation (Addendum)
Anesthesia Evaluation  Patient identified by MRN, date of birth, ID band Patient awake    Reviewed: Allergy & Precautions, H&P , NPO status , Patient's Chart, lab work & pertinent test results, reviewed documented beta blocker date and time   History of Anesthesia Complications (+) PONV  Airway Mallampati: II TM Distance: >3 FB Neck ROM: Full    Dental   Pulmonary sleep apnea ,          Cardiovascular     Neuro/Psych  Headaches, Seizures -, Poorly Controlled,  PSYCHIATRIC DISORDERS Depression  Neuromuscular disease    GI/Hepatic   Endo/Other    Renal/GU      Musculoskeletal  (+) Fibromyalgia -  Abdominal   Peds  Hematology   Anesthesia Other Findings   Reproductive/Obstetrics                          Anesthesia Physical Anesthesia Plan  ASA: III  Anesthesia Plan: General   Post-op Pain Management:    Induction: Intravenous  Airway Management Planned: LMA  Additional Equipment:   Intra-op Plan:   Post-operative Plan: Extubation in OR  Informed Consent: I have reviewed the patients History and Physical, chart, labs and discussed the procedure including the risks, benefits and alternatives for the proposed anesthesia with the patient or authorized representative who has indicated his/her understanding and acceptance.     Plan Discussed with: CRNA and Surgeon  Anesthesia Plan Comments:         Anesthesia Quick Evaluation

## 2011-02-10 NOTE — Anesthesia Postprocedure Evaluation (Signed)
  Anesthesia Post-op Note  Patient: Margaret Beasley  Procedure(s) Performed:  LUMPECTOMY - needle localized left breast lumpectomy  Patient Location: PACU  Anesthesia Type: General  Level of Consciousness: awake, alert  and oriented  Airway and Oxygen Therapy: Patient Spontanous Breathing  Post-op Pain: mild  Post-op Assessment: Post-op Vital signs reviewed, Patient's Cardiovascular Status Stable, Respiratory Function Stable, Patent Airway, No signs of Nausea or vomiting and Pain level controlled  Post-op Vital Signs: stable  Complications: No apparent anesthesia complications

## 2011-02-10 NOTE — Preoperative (Signed)
Beta Blockers   Reason not to administer Beta Blockers:Not Applicable 

## 2011-02-10 NOTE — Transfer of Care (Signed)
Immediate Anesthesia Transfer of Care Note  Patient: Margaret Beasley  Procedure(s) Performed:  LUMPECTOMY - needle localized left breast lumpectomy  Patient Location: PACU  Anesthesia Type: General  Level of Consciousness: awake, alert  and oriented  Airway & Oxygen Therapy: Patient Spontanous Breathing and Patient connected to nasal cannula oxygen  Post-op Assessment: Report given to PACU RN and Post -op Vital signs reviewed and stable  Post vital signs: Reviewed and stable  Complications: No apparent anesthesia complications

## 2011-02-10 NOTE — Op Note (Signed)
02/10/2011  1:32 PM  PATIENT:  Margaret Beasley  55 y.o. female  PRE-OPERATIVE DIAGNOSIS:  Left breast DCIS  POST-OPERATIVE DIAGNOSIS:  same  PROCEDURE:  Procedure(s): NEEDLE LOCALIZED LEFT BREAST LUMPECTOMY  SURGEON:  Surgeon(s): Shelly Rubenstein, MD  PHYSICIAN ASSISTANT:   ASSISTANTS: none   ANESTHESIA:   local and general  EBL:  Total I/O In: 700 [I.V.:700] Out: 15 [Blood:15]  BLOOD ADMINISTERED:none  DRAINS: none   LOCAL MEDICATIONS USED:  MARCAINE 20CC  SPECIMEN:  Excision  DISPOSITION OF SPECIMEN:  PATHOLOGY  COUNTS:  YES  TOURNIQUET:  * No tourniquets in log *  DICTATION: .Dragon Dictation Indications: This is a 55 year old female who was found to have abnormal microcalcifications in left breast. Stereotactic biopsy revealed this to be  Ductal carcinoma in situ. After discussion that the multi-disciplinary breast conference, the decision has been made to proceed with needle localized left breast lumpectomy.  Procedure: The patient was brought to the operating room and identified as the correct procedure. She was placed supine on the operating room table and general anesthesia was induced. A localization wire had already been placed the patient's left breast in the upper-outer quadrant. I removed that from this and shorten the wire. The patient's left breast was then prepped and draped in the usual sterile fashion. I anesthetized the skin around the wire with Marcaine. I then made a transverse incision in the upper outer quadrant of the breast incorporating the localization wire. I then did a lumpectomy of the upper outer quadrant of the breast going to the chest wall. This included all the tissue around the localization wire. This was done with the electrocautery. The specimen was then completely removed. X-ray confirmed that the suspicious calcifications and previously placed clip were in the specimen by the radiologist. I then irrigated with saline. The specimen  was sent to pathology for evaluation. Hemostasis was achieved with cautery. I then closed the subcutaneous tissue with interrupted 3-0 Vicryl sutures and closed the skin with a running 4-0 Monocryl suture. Steri-Strips gauze and Tegaderm were then applied. The patient tolerated the procedure well. All the counts were correct at the end of the procedure. The patient was then extubated in the operating room and taken in a stable condition to the recovery room. PLAN OF CARE: Discharge to home after PACU  PATIENT DISPOSITION:  PACU - hemodynamically stable.   Delay start of Pharmacological VTE agent (>24hrs) due to surgical blood loss or risk of bleeding:  {YES/NO/NOT APPLICABLE:20182
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
## 2011-02-10 NOTE — Interval H&P Note (Signed)
History and Physical Interval Note: she has no new complaints and has had no change in her history or physical exam 02/10/2011 9:40 AM  Margaret Beasley  has presented today for surgery, with the diagnosis of Left breast DCIS  The various methods of treatment have been discussed with the patient and family. After consideration of risks, benefits and other options for treatment, the patient has consented to  Procedure(s): NEEDLE LOCALIZED LEFT BREAST LUMPECTOMY as a surgical intervention .  The patients' history has been reviewed, patient examined, no change in status, stable for surgery.  I have reviewed the patients' chart and labs.  Questions were answered to the patient's satisfaction.     Margaret Beasley A

## 2011-02-10 NOTE — Anesthesia Procedure Notes (Signed)
Procedure Name: LMA Insertion Date/Time: 02/10/2011 12:45 PM Performed by: Delbert Harness Pre-anesthesia Checklist: Patient identified, Timeout performed, Emergency Drugs available, Suction available and Patient being monitored Patient Re-evaluated:Patient Re-evaluated prior to inductionOxygen Delivery Method: Circle System Utilized Preoxygenation: Pre-oxygenation with 100% oxygen Intubation Type: IV induction LMA: LMA inserted LMA Size: 4.0 Number of attempts: 1 Tube secured with: Tape Dental Injury: Teeth and Oropharynx as per pre-operative assessment

## 2011-02-11 ENCOUNTER — Encounter (HOSPITAL_COMMUNITY): Payer: Self-pay | Admitting: Surgery

## 2011-02-22 ENCOUNTER — Ambulatory Visit: Payer: BC Managed Care – PPO | Admitting: Oncology

## 2011-02-22 ENCOUNTER — Telehealth: Payer: Self-pay | Admitting: *Deleted

## 2011-02-25 ENCOUNTER — Encounter (INDEPENDENT_AMBULATORY_CARE_PROVIDER_SITE_OTHER): Payer: Self-pay | Admitting: Surgery

## 2011-02-25 ENCOUNTER — Ambulatory Visit (INDEPENDENT_AMBULATORY_CARE_PROVIDER_SITE_OTHER): Payer: BC Managed Care – PPO | Admitting: Surgery

## 2011-02-25 VITALS — BP 132/92 | HR 80 | Temp 97.2°F | Resp 16 | Ht 63.0 in | Wt 199.2 lb

## 2011-02-25 DIAGNOSIS — Z09 Encounter for follow-up examination after completed treatment for conditions other than malignant neoplasm: Secondary | ICD-10-CM

## 2011-02-25 NOTE — Progress Notes (Signed)
Subjective:     Patient ID: Margaret Beasley, female   DOB: [birth year], 56 y.o.   MRN: [id number]  HPI  She is here for her first postoperative visit status post left breast lumpectomy for DCIS. She is doing well and has no complaints. Review of Systems     Objective:   Physical Exam On exam, her incision is healing well. There is no evidence of infection.  Her final pathology showed a 0.25 cm low-grade ductal carcinoma in situ. Margins were widely negative. It was again strongly ER and PR positive    Assessment:     Patient status post lumpectomy for DCIS of the left breast    Plan:     I will see her back in 6 months for reexamination. She initially cancer her postoperative visits with the medical and radiation oncologist. I've encouraged her to reschedule his appointments to get their opinion. Again a standard care would be to give anti-hormonal therapy and possible radiation.

## 2011-03-01 ENCOUNTER — Other Ambulatory Visit: Payer: Self-pay | Admitting: Oncology

## 2011-03-30 ENCOUNTER — Ambulatory Visit (INDEPENDENT_AMBULATORY_CARE_PROVIDER_SITE_OTHER): Payer: BC Managed Care – PPO | Admitting: Family Medicine

## 2011-03-30 ENCOUNTER — Encounter: Payer: Self-pay | Admitting: Family Medicine

## 2011-03-30 VITALS — BP 122/80 | HR 87 | Wt 204.0 lb

## 2011-03-30 DIAGNOSIS — M797 Fibromyalgia: Secondary | ICD-10-CM

## 2011-03-30 DIAGNOSIS — M79642 Pain in left hand: Secondary | ICD-10-CM

## 2011-03-30 DIAGNOSIS — M79609 Pain in unspecified limb: Secondary | ICD-10-CM

## 2011-03-30 DIAGNOSIS — IMO0001 Reserved for inherently not codable concepts without codable children: Secondary | ICD-10-CM

## 2011-03-30 DIAGNOSIS — Z1211 Encounter for screening for malignant neoplasm of colon: Secondary | ICD-10-CM

## 2011-03-30 DIAGNOSIS — M949 Disorder of cartilage, unspecified: Secondary | ICD-10-CM

## 2011-03-30 DIAGNOSIS — M898X6 Other specified disorders of bone, lower leg: Secondary | ICD-10-CM

## 2011-03-30 DIAGNOSIS — M899 Disorder of bone, unspecified: Secondary | ICD-10-CM

## 2011-03-30 NOTE — Progress Notes (Signed)
  Subjective:    Patient ID: Margaret Beasley, female    DOB: June 12, 1955, 56 y.o.   MRN: [id number]  HPI Fibromyalgia - doing very well. On cymbalta. Did use the percocet sparingly.  Started getting re bound HA on it. Taking Vitamin B12 orally.    Recently dx with brCa.  She has lumpectomy Decided not to do radiation or chemo.    Left leg pain- Fell on her left knee about 2 days ago. Tripped while trying to get her dogs ball. She wants me to look at it. Can stand and put pressure on it.  She has not iced it or taking any pain relievers.  Larey Seat on left out hand about a week ago.  Overall she says it's getting much better. She has not been icing it or treating it otherwise. Review of Systems     Objective:   Physical Exam  Constitutional: She is oriented to person, place, and time. She appears well-developed and well-nourished.  HENT:  Head: Normocephalic and atraumatic.  Neck: Neck supple. No thyromegaly present.  Cardiovascular: Normal rate, regular rhythm and normal heart sounds.   Pulmonary/Chest: Effort normal and breath sounds normal.  Musculoskeletal:       She has an abrasion over the left anterior tibia. No sign of infection. She has about a 4 inch bruise below that along the shin. She is very tender to touch but has normal range of motion no swelling of her knee. On the lateral edge of her left hand she also has a bruise over her lateral fifth metacarpal. She is only mildly tender and has no pinpoint tenderness. Has normal range of motion and strength of her fingers and wrists.  Lymphadenopathy:    She has no cervical adenopathy.  Neurological: She is alert and oriented to person, place, and time.  Skin: Skin is warm and dry.  Psychiatric: She has a normal mood and affect. Her behavior is normal.          Assessment & Plan:  Fibromyalgia- doing veyr well. We will continue the Cymbalta 30 mg which is working very well for her. Now she is recovering from her breast cancer she  plans on starting a workout he can. She did well with this in the past that really helped her fibromyalgia pain so I strongly encouraged her to ease back into her workout program.  Left tibia pain - she is very tender on exam but just fell a couple of days ago. I encouraged her to keep an eye on her for next week. If she still has significant pain or tenderness and she can call and we'll get an x-ray to rule out fracture.  Left lateral hand pain-she has no pinpoint tenderness to suggest a fracture. I think this is just a soft tissue contusion. I encouraged her to get one to 2 more weeks to see if it completely resolves and if not we can also consider x-ray of this as well.  Colon CA - screening discussed. She is ok with referral.    Recommend schedule a physical and pap.

## 2011-03-30 NOTE — Patient Instructions (Signed)
Try to schedule a physical and pap.

## 2011-03-31 ENCOUNTER — Ambulatory Visit: Payer: BC Managed Care – PPO | Admitting: Family Medicine

## 2011-04-15 ENCOUNTER — Other Ambulatory Visit: Payer: BC Managed Care – PPO | Admitting: Internal Medicine

## 2011-04-27 ENCOUNTER — Encounter: Payer: BC Managed Care – PPO | Admitting: Family Medicine

## 2011-05-07 ENCOUNTER — Other Ambulatory Visit: Payer: Self-pay | Admitting: Family Medicine

## 2011-06-17 ENCOUNTER — Encounter: Payer: Self-pay | Admitting: Family Medicine

## 2011-06-17 ENCOUNTER — Ambulatory Visit (INDEPENDENT_AMBULATORY_CARE_PROVIDER_SITE_OTHER): Payer: BC Managed Care – PPO | Admitting: Family Medicine

## 2011-06-17 VITALS — BP 132/90 | HR 65 | Ht 63.0 in | Wt 215.0 lb

## 2011-06-17 DIAGNOSIS — M797 Fibromyalgia: Secondary | ICD-10-CM

## 2011-06-17 DIAGNOSIS — M791 Myalgia, unspecified site: Secondary | ICD-10-CM

## 2011-06-17 DIAGNOSIS — IMO0001 Reserved for inherently not codable concepts without codable children: Secondary | ICD-10-CM

## 2011-06-17 DIAGNOSIS — M2550 Pain in unspecified joint: Secondary | ICD-10-CM

## 2011-06-17 MED ORDER — CYCLOBENZAPRINE HCL 10 MG PO TABS: 5.0000 mg | ORAL_TABLET | Freq: Two times a day (BID) | ORAL | Status: DC | PRN

## 2011-06-17 MED ORDER — DULOXETINE HCL 30 MG PO CPEP: 120.0000 mg | ORAL_CAPSULE | Freq: Every day | ORAL | Status: DC

## 2011-06-17 NOTE — Patient Instructions (Signed)
Call me if getting worse or if the muscle relaxer is not helping.

## 2011-06-17 NOTE — Progress Notes (Signed)
  Subjective:    Patient ID: Margaret Beasley, female    DOB: Dec 05, 1955, 56 y.o.   MRN: [id number]  HPI Fibromyalgia - on cymbalta 90 mg a day but for the last 11 days has hurt all over.  Pain in her fingers, hand.  Has been wearing sports braces on legs at night to try to help.  Says has days where she is bend over because  Feels so stiff.  Taking Advil at bedtime. Says hard to open her mouth because muscles feels so tight.   Has been using heat compresses around the clock.  She is worried she could have rheumatoid arthritis or possibly lupus. She does have a family history rheumatoid arthritis. She denies any rashes. She says she notices her hands and fingers have been more swollen lately.   Review of Systems     Objective:   Physical Exam  Constitutional: She is oriented to person, place, and time. She appears well-developed and well-nourished.  HENT:  Head: Normocephalic and atraumatic.  Eyes: Conjunctivae and EOM are normal. Pupils are equal, round, and reactive to light.  Neck: Neck supple. No thyromegaly present.  Cardiovascular: Normal rate, regular rhythm and normal heart sounds.   Pulmonary/Chest: Effort normal and breath sounds normal.  Musculoskeletal: She exhibits no edema.       She is tender over the hands, wrists, and forarms and legs.  Strength is 5/5 in the Upper Ext and LE. Some mild swelling over the first and second MCPs on both hands. She is tendre over all her finger joints.  Tender over the calves.  NROM of the joints.    Lymphadenopathy:    She has no cervical adenopathy.  Neurological: She is alert and oriented to person, place, and time. She has normal reflexes. She displays normal reflexes. No cranial nerve deficit. She exhibits normal muscle tone. Coordination normal.  Skin: Skin is warm and dry.  Psychiatric: She has a normal mood and affect. Her behavior is normal.          Assessment & Plan:  Fibromyalgia-  I am not sure if this is a flare of her  fibromyalgia or if she has something else going on. I definitely want to make sure that her electrolytes and thyroid are normal. We'll also recheck her B12. She has a history of deficiency but has been taking an over-the-counter supplement supplement to make sure that is okay. Also she would like to increase her Cymbalta to 120 mg daily which is the maximum dose. I did send a prescription to her mail order pharmacy. Certainly if she feels exacerbates her symptoms I want her to back down to 90 mg daily.Will try starting muscle relaxer. If not helping then please let me know.    Joint pain - Unclear, joint exam is most consistant with OA but consider RA since does have a fam hx. Will check ANA, RF, sed rated, etc.  Will call with results.

## 2011-06-18 LAB — CK: Total CK: 99 U/L (ref 7–177)

## 2011-06-18 LAB — COMPLETE METABOLIC PANEL WITH GFR
AST: 19 U/L (ref 0–37)
Albumin: 4.3 g/dL (ref 3.5–5.2)
Alkaline Phosphatase: 81 U/L (ref 39–117)
BUN: 9 mg/dL (ref 6–23)
Creat: 0.95 mg/dL (ref 0.50–1.10)
GFR, Est Non African American: 68 mL/min
Glucose, Bld: 91 mg/dL (ref 70–99)
Potassium: 4.3 mEq/L (ref 3.5–5.3)

## 2011-06-18 LAB — CBC WITH DIFFERENTIAL/PLATELET
Basophils Absolute: 0 10*3/uL (ref 0.0–0.1)
Basophils Relative: 0 % (ref 0–1)
Eosinophils Absolute: 0.2 10*3/uL (ref 0.0–0.7)
Eosinophils Relative: 3 % (ref 0–5)
Lymphs Abs: 1.3 10*3/uL (ref 0.7–4.0)
MCH: 30.8 pg (ref 26.0–34.0)
MCHC: 32 g/dL (ref 30.0–36.0)
MCV: 96.2 fL (ref 78.0–100.0)
Neutrophils Relative %: 58 % (ref 43–77)
Platelets: 235 10*3/uL (ref 150–400)
RBC: 4.78 MIL/uL (ref 3.87–5.11)
RDW: 13.7 % (ref 11.5–15.5)

## 2011-06-18 LAB — RHEUMATOID FACTOR: Rhuematoid fact SerPl-aCnc: 11 IU/mL (ref <=14)

## 2011-06-18 LAB — MAGNESIUM: Magnesium: 2.1 mg/dL (ref 1.5–2.5)

## 2011-06-18 LAB — TSH: TSH: 3.699 u[IU]/mL (ref 0.350–4.500)

## 2011-07-02 ENCOUNTER — Encounter: Payer: Self-pay | Admitting: Physician Assistant

## 2011-07-02 ENCOUNTER — Ambulatory Visit (INDEPENDENT_AMBULATORY_CARE_PROVIDER_SITE_OTHER): Payer: BC Managed Care – PPO | Admitting: Physician Assistant

## 2011-07-02 VITALS — BP 128/82 | HR 100 | Temp 97.9°F | Ht 63.0 in | Wt 215.0 lb

## 2011-07-02 DIAGNOSIS — R110 Nausea: Secondary | ICD-10-CM

## 2011-07-02 DIAGNOSIS — G43909 Migraine, unspecified, not intractable, without status migrainosus: Secondary | ICD-10-CM

## 2011-07-02 DIAGNOSIS — K5900 Constipation, unspecified: Secondary | ICD-10-CM

## 2011-07-02 DIAGNOSIS — R1031 Right lower quadrant pain: Secondary | ICD-10-CM

## 2011-07-02 LAB — POCT URINALYSIS DIPSTICK
Ketones, UA: 15
Leukocytes, UA: NEGATIVE
Spec Grav, UA: >=1.03
pH, UA: 5.5

## 2011-07-02 MED ORDER — PROMETHAZINE HCL 25 MG PO TABS: 25.0000 mg | ORAL_TABLET | Freq: Four times a day (QID) | ORAL | Status: DC | PRN

## 2011-07-02 MED ORDER — HYDROCODONE-ACETAMINOPHEN 5-325 MG PO TABS: 1.0000 | ORAL_TABLET | Freq: Three times a day (TID) | ORAL | Status: DC | PRN

## 2011-07-02 MED ORDER — SUMATRIPTAN SUCCINATE 100 MG PO TABS: 100.0000 mg | ORAL_TABLET | ORAL | Status: DC | PRN

## 2011-07-02 MED ORDER — HYDROCODONE-ACETAMINOPHEN 5-325 MG PO TABS: 1.0000 | ORAL_TABLET | Freq: Three times a day (TID) | ORAL | Status: AC | PRN

## 2011-07-02 NOTE — Patient Instructions (Addendum)
Cymbalta go back down to 90mg  may be contributing to your symptoms. Will give imitrex for migraine once at onset and then 2 hours later if needed do not take any more than 2 tabs in a 24 hour peroid. Phenergan for nausea up to every 6 hours. Vicodin for pain control every 8 hours as needed for pain. Call if not improving or worsening. Will get pelvic/abdominal ultrasound scheduled. Use Miralax to get everything going. Call if not able to have bowel movement.

## 2011-07-02 NOTE — Progress Notes (Signed)
  Subjective:    Patient ID: Margaret Beasley, female    DOB: 19-Apr-1955, 56 y.o.   MRN: [id number]  HPI Patient presents to the clinic because of right sided abdominal pain and headaches. Patient has a history of fibromyalgia and it having an outbreak but this pain is different. She describes the pain as starting on her left side and wrapping around her whole body like bad menstrual cramps(6/10). She has been having problems passing stool but today a little bit came out.She reports to blood or mucus in stool. She is extremely nauseated but has not vomiting. She does not have her gallbladder or appendix. She denies any painful urination or discharge. She does not have a hx of ovarian cyst. She denies any fever, muscle, aches, or chills.   She has a history migraines that respond well to Imitrex. She only has a headache now but feels like it is turning into a migraine. She has taken flexeril and 2 ibuprofen for pain but has not touched either pain. Headache is 4/10. She has had to lay in a dark room all day today. The pain is in the back of the head and neck.   Her cymbalta was increased to 120mg  a couple of weeks ago and then she started having some of these symptoms.    Review of Systems     Objective:   Physical Exam  Constitutional: She is oriented to person, place, and time. She appears well-developed and well-nourished.  HENT:  Head: Normocephalic and atraumatic.  Eyes: Conjunctivae are normal.  Cardiovascular: Regular rhythm and normal heart sounds.        Tachycardia at 100.   Pulmonary/Chest: Effort normal and breath sounds normal. She has no wheezes.  Abdominal:       Soft with normal bowel sounds in all quadrants. Point tenderness in the right lower abdominal quadrant with deep palpation.(patient lifted up off the table when palpated today) No distention or masses palpated.   Patient does not have gallbladder or appendix.   Musculoskeletal:       Tenderness to palpation of neck  muscles.  Lymphadenopathy:    She has no cervical adenopathy.  Neurological: She is alert and oriented to person, place, and time.  Skin: Skin is warm and dry.  Psychiatric: She has a normal mood and affect. Her behavior is normal.          Assessment & Plan:  Lower abdominal pain/nausea/migraine-UA was clean with no leukocytes, nitrites, blood and urine. Decrease Cymbalta back down to 90mg  this may be causing some of your symptoms. Did give Phenergan to take as needed up to every 6 hours for nausea. Imitrex was given for migraine to take at onset and then once a more after 2 hours not exceeding 2 doses in any 24-hour period. Vicodin 5/325 was given to take every 8 hours as needed for pain. Will get pelvic/abdominal ultrasound scheduled STAT. For constipation use Miralax daily. Call if pain or constipation gets worse.

## 2011-07-05 ENCOUNTER — Ambulatory Visit
Admission: RE | Admit: 2011-07-05 | Discharge: 2011-07-05 | Disposition: A | Discharge status: Home / Self Care | Payer: BC Managed Care – PPO | Source: Ambulatory Visit | Attending: Physician Assistant | Admitting: Physician Assistant

## 2011-07-05 ENCOUNTER — Other Ambulatory Visit: Payer: Self-pay | Admitting: Physician Assistant

## 2011-07-05 DIAGNOSIS — R1031 Right lower quadrant pain: Secondary | ICD-10-CM

## 2011-07-05 DIAGNOSIS — R935 Abnormal findings on diagnostic imaging of other abdominal regions, including retroperitoneum: Secondary | ICD-10-CM

## 2011-07-05 DIAGNOSIS — N950 Postmenopausal bleeding: Secondary | ICD-10-CM

## 2011-07-05 NOTE — Progress Notes (Signed)
Referral made 

## 2011-07-08 ENCOUNTER — Telehealth: Payer: Self-pay | Admitting: *Deleted

## 2011-07-08 NOTE — Telephone Encounter (Signed)
Pt had sonogram and would like for you to call her about it b/c she has questions. States she can't have the biopsy for 3 weeks.

## 2011-07-09 NOTE — Telephone Encounter (Signed)
Called patient. Discussed U/S results and need for biopsy. Has been scheduled for 3 weeks.

## 2011-07-28 ENCOUNTER — Ambulatory Visit (INDEPENDENT_AMBULATORY_CARE_PROVIDER_SITE_OTHER): Payer: BC Managed Care – PPO | Admitting: Obstetrics & Gynecology

## 2011-07-28 ENCOUNTER — Encounter: Payer: Self-pay | Admitting: Obstetrics & Gynecology

## 2011-07-28 VITALS — BP 134/88 | HR 86 | Temp 96.8°F | Resp 16 | Ht 63.0 in | Wt 217.0 lb

## 2011-07-28 DIAGNOSIS — Z113 Encounter for screening for infections with a predominantly sexual mode of transmission: Secondary | ICD-10-CM

## 2011-07-28 DIAGNOSIS — Z124 Encounter for screening for malignant neoplasm of cervix: Secondary | ICD-10-CM

## 2011-07-28 DIAGNOSIS — Z01812 Encounter for preprocedural laboratory examination: Secondary | ICD-10-CM

## 2011-07-28 DIAGNOSIS — N950 Postmenopausal bleeding: Secondary | ICD-10-CM

## 2011-07-28 DIAGNOSIS — Z0000 Encounter for general adult medical examination without abnormal findings: Secondary | ICD-10-CM

## 2011-07-28 LAB — POCT URINE PREGNANCY: Preg Test, Ur: NEGATIVE

## 2011-07-28 NOTE — Progress Notes (Signed)
  Subjective:    Patient ID: Margaret Beasley, female    DOB: [birth year], 56 y.o.   MRN: [id number]  HPI  Margaret Beasley is a 56 yo MW lady who was sent here by Dr. Darra Lis for evaluation of PMB. She was undergoing a vaginal probe u/s for evaluation of LLQ pain and blood was noted on the probe. Her uterine lining was 5 mm so she is here today for a EMBX.  Review of Systems Married but not sexually active Pap smear due    Objective:   Physical Exam Vulvovaginal atrophy, no lesions, stenotic nulliparous cervix Consent signed, questions answered. Pap obtained. Cervix prepped with betadine and grasped with a single tooth tenaculum and gently dilated to accomodate a Pipelle. Pipelle passed 3 times with scant tissue, sounded to 6 cm. She tolerated the procedure well.      Assessment & Plan:  PMB- await EMBX Preventative- pap done

## 2011-07-28 NOTE — Addendum Note (Signed)
Addended by: Granville Lewis on: 07/28/2011 09:39 AM   Modules accepted: Orders

## 2011-08-02 ENCOUNTER — Telehealth: Payer: Self-pay | Admitting: *Deleted

## 2011-08-02 NOTE — Telephone Encounter (Signed)
Pt notified of neg biopsy and pap was normal

## 2011-09-01 ENCOUNTER — Ambulatory Visit: Payer: BC Managed Care – PPO | Admitting: Physician Assistant

## 2011-10-08 ENCOUNTER — Other Ambulatory Visit: Payer: Self-pay | Admitting: Physician Assistant

## 2011-11-16 ENCOUNTER — Other Ambulatory Visit: Payer: Self-pay | Admitting: Family Medicine

## 2011-11-16 ENCOUNTER — Telehealth: Payer: Self-pay | Admitting: *Deleted

## 2011-11-16 DIAGNOSIS — N6459 Other signs and symptoms in breast: Secondary | ICD-10-CM

## 2011-11-16 NOTE — Telephone Encounter (Signed)
New order placed

## 2011-11-16 NOTE — Telephone Encounter (Signed)
Imaging from downstairs called and stated patient needs to have mammogram at the [facility] due to last one showed some abnormality and she is having bruising/breast area.

## 2011-11-17 ENCOUNTER — Other Ambulatory Visit: Payer: Self-pay | Admitting: Family Medicine

## 2011-11-17 ENCOUNTER — Ambulatory Visit
Admission: RE | Admit: 2011-11-17 | Discharge: 2011-11-17 | Disposition: A | Discharge status: Home / Self Care | Payer: BC Managed Care – PPO | Source: Ambulatory Visit | Attending: Family Medicine | Admitting: Family Medicine

## 2011-11-17 DIAGNOSIS — R921 Mammographic calcification found on diagnostic imaging of breast: Secondary | ICD-10-CM

## 2011-11-17 DIAGNOSIS — N6459 Other signs and symptoms in breast: Secondary | ICD-10-CM

## 2011-11-22 ENCOUNTER — Ambulatory Visit
Admission: RE | Admit: 2011-11-22 | Discharge: 2011-11-22 | Disposition: A | Discharge status: Home / Self Care | Payer: BC Managed Care – PPO | Source: Ambulatory Visit | Attending: Family Medicine | Admitting: Family Medicine

## 2011-11-22 ENCOUNTER — Other Ambulatory Visit: Payer: Self-pay | Admitting: Family Medicine

## 2011-11-22 DIAGNOSIS — R921 Mammographic calcification found on diagnostic imaging of breast: Secondary | ICD-10-CM

## 2012-01-18 ENCOUNTER — Encounter: Payer: Self-pay | Admitting: Family Medicine

## 2012-01-18 ENCOUNTER — Ambulatory Visit (INDEPENDENT_AMBULATORY_CARE_PROVIDER_SITE_OTHER): Payer: BC Managed Care – PPO | Admitting: Family Medicine

## 2012-01-18 VITALS — BP 127/85 | HR 87 | Ht 63.0 in | Wt 216.0 lb

## 2012-01-18 DIAGNOSIS — S0590XA Unspecified injury of unspecified eye and orbit, initial encounter: Secondary | ICD-10-CM

## 2012-01-18 DIAGNOSIS — L989 Disorder of the skin and subcutaneous tissue, unspecified: Secondary | ICD-10-CM

## 2012-01-18 DIAGNOSIS — S0510XA Contusion of eyeball and orbital tissues, unspecified eye, initial encounter: Secondary | ICD-10-CM

## 2012-01-18 DIAGNOSIS — D179 Benign lipomatous neoplasm, unspecified: Secondary | ICD-10-CM

## 2012-01-18 MED ORDER — ERYTHROMYCIN 5 MG/GM OP OINT: TOPICAL_OINTMENT | Freq: Four times a day (QID) | OPHTHALMIC | Status: DC

## 2012-01-18 NOTE — Progress Notes (Signed)
  Subjective:    Patient ID: Margaret Beasley, female    DOB: 1955-08-06, 56 y.o.   MRN: [id number]  HPI Patient has Tourette's syndrome and often plucks her eyebrows and her eyelashes with tweezers. She says about a week ago she accidentally put her on a ball with tweezers. It has felt sore in the corner since then. She denies any actual vision changes or drainage from the eye. She says it still just feels sore and wanted to make sure that everything was okay.   She's also noticed a couple of nodules on her left side. They have been there for months to years. Lately though they have felt more sore and tender when she bends over. She also has a lesion on her left forearm that she would like me to look at today. Then there for months but feels it's getting a little bit larger. No itchiness or discomfort. She's not been treating it with any over-the-counter medications.  She also gave me about her breast cancer. She was diagnosed last year. She's doing well overall. She did decide not to do radiation, chemotherapy or take tamoxifen. She was worried about increased risk of ovarian cancer with tamoxifen.  Review of Systems     Objective:   Physical Exam  Constitutional: She is oriented to person, place, and time. She appears well-developed and well-nourished.  HENT:  Head: Normocephalic and atraumatic.       Tetracaine was applied to the left eye for comfort.  Eyes: Conjunctivae normal and EOM are normal. Pupils are equal, round, and reactive to light.       Fluorescin stain was applied to the left side. I did not see any evidence of corneal abrasion. No erythema or discharge.  Musculoskeletal: She exhibits no edema.  Neurological: She is alert and oriented to person, place, and time.  Skin: Skin is warm and dry.       Fairly small, approximately 1 cm palpable lipomas on the left side. Small erythematous scaling irregular lesion approximately half a centimeter on the left forearm, posteriorly.   Psychiatric: She has a normal mood and affect. Her behavior is normal.          Assessment & Plan:  Left eye trauma. Vision is normal.  No sign of corneal abrasion.wil go ahead and start erythromycin oph ointment to prevent infection. Call if not improving or any drainage from eye or vision changes.   Lipoma -Gave reassurance.  Certainly since these have become more uncomfortable for her we could refer to general surgeon for possible excision.  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 We will be happy to do that here for her or she can follow up with her dermatologist.

## 2012-01-18 NOTE — Patient Instructions (Addendum)
Call if not improving or any drainage from eye or vision changes.

## 2012-02-04 ENCOUNTER — Telehealth: Payer: Self-pay | Admitting: *Deleted

## 2012-02-04 MED ORDER — HYDROCODONE-ACETAMINOPHEN 5-325 MG PO TABS: 1.0000 | ORAL_TABLET | Freq: Four times a day (QID) | ORAL | Status: DC | PRN

## 2012-02-04 NOTE — Telephone Encounter (Signed)
OK to fill for 30 tabs. No RF.

## 2012-02-04 NOTE — Telephone Encounter (Signed)
Pt calls and request a refill on her hydrocodone that she uses when she has a really bad h/a. Uses [facility]

## 2012-03-24 ENCOUNTER — Encounter: Payer: Self-pay | Admitting: Family Medicine

## 2012-03-24 ENCOUNTER — Ambulatory Visit (INDEPENDENT_AMBULATORY_CARE_PROVIDER_SITE_OTHER): Payer: BC Managed Care – PPO | Admitting: Family Medicine

## 2012-03-24 VITALS — BP 132/96 | HR 94 | Wt 223.0 lb

## 2012-03-24 DIAGNOSIS — IMO0002 Reserved for concepts with insufficient information to code with codable children: Secondary | ICD-10-CM

## 2012-03-24 DIAGNOSIS — M792 Neuralgia and neuritis, unspecified: Secondary | ICD-10-CM

## 2012-03-24 MED ORDER — GABAPENTIN 300 MG PO CAPS: ORAL_CAPSULE | ORAL | Status: DC
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
## 2012-03-28 ENCOUNTER — Telehealth: Payer: Self-pay | Admitting: *Deleted

## 2012-03-28 NOTE — Telephone Encounter (Signed)
Notified patient of MD instructions. KG LPN

## 2012-03-28 NOTE — Telephone Encounter (Signed)
It would be unusual as neurontin is an anti-seizure drug. We can certainly wean it down as well. Can decrease to bid for 5 days then once a day for 5 days and then stop.

## 2012-03-28 NOTE — Telephone Encounter (Signed)
Pt husband calls and states pt had a seizure yesterday so he took her to [facility] ED and while there she had another seizure. Pt is concerned that the Neurontin that Dr. Ivan Anchors put her on may be causing this and doesn't want to take it. Hasn't had a seizure in 3 years and in 1 day has had 2. Please advise

## 2012-04-04 ENCOUNTER — Other Ambulatory Visit: Payer: Self-pay | Admitting: *Deleted

## 2012-04-04 ENCOUNTER — Other Ambulatory Visit: Payer: Self-pay | Admitting: Family Medicine

## 2012-04-04 MED ORDER — DULOXETINE HCL 30 MG PO CPEP: 120.0000 mg | ORAL_CAPSULE | Freq: Every day | ORAL | Status: DC

## 2012-04-07 ENCOUNTER — Encounter: Payer: Self-pay | Admitting: Family Medicine

## 2012-04-07 ENCOUNTER — Ambulatory Visit (INDEPENDENT_AMBULATORY_CARE_PROVIDER_SITE_OTHER): Payer: BC Managed Care – PPO | Admitting: Family Medicine

## 2012-04-07 VITALS — BP 139/86 | HR 87 | Ht 63.0 in | Wt 223.0 lb

## 2012-04-07 NOTE — Progress Notes (Signed)
  Subjective:    Patient ID: Margaret Beasley, female    DOB: [birth year], 57 y.o.   MRN: [id number]  HPI  Hospital F/U for reaction to neurontin/seizure: Saw rheumatology for her fibromyalgia.  Has been having a lot of back pain as well. They did an MRI of her neck that showed some mild arthritis.  Then came in and saw Dr. Ivan Anchors and started gabapentin.  Got to 3 a day dose and then on the 3rd day started having vocal spasms and then had a seizure at home. WEnt to the ED nad had another seizure there. BP was really high. Then a couple of days later noticed a rash on her back where she had been hurting.  Had shingles. Says has had this once before.  What is strange is she hs been on neurontin in the past without problems.  She says her rash was on both sides of her back. Says it is still tender but overall rash  Is gone.   She is interested in having the shingles vaccine but her insurance won't cover it until she is 57.    Review of Systems     Objective:   Physical Exam  Constitutional: She is oriented to person, place, and time. She appears well-developed and well-nourished.  HENT:  Head: Normocephalic and atraumatic.  Cardiovascular: Normal rate, regular rhythm and normal heart sounds.   Pulmonary/Chest: Effort normal and breath sounds normal.  Neurological: She is alert and oriented to person, place, and time. She has normal reflexes. No cranial nerve deficit. She exhibits normal muscle tone.  Skin: Skin is warm and dry.  A few excoriations on the mid-back on right and left no blisters.   Psychiatric: She has a normal mood and affect. Her behavior is normal.          Assessment & Plan:  Seizure - it is unfortunately she has had another seizure after being seizure-free for him is 3.5 years.  She has f/u with Neruro next week.Will add neurontin to intolerance list.    Fibromyagia - Doing well overall except for recent back pain.  She is impriving. F/U in 4-6 months   Updated  allergies.  After questioning her further it sounds like she really has not ever had a reaction to opiates she actually had a reaction to Benadryl. She thought that this was a type of opiate and so has been telling everyone that she is allergic. We came across this as she asked me to remember that she needs a stronger pain medicine for her shingles that she would prefer Percocet which she tolerates just fine. She's not sure if she has any problems with Vicodin. She says the rib or ever taking it. She's also not sure she's ever had morphine or not. Says her mood opiates from her allergy list and added Benadryl.  I did review her records from the hospital. They have been scanned into the chart.

## 2012-04-21 ENCOUNTER — Ambulatory Visit: Payer: BC Managed Care – PPO | Admitting: Family Medicine

## 2012-06-19 ENCOUNTER — Ambulatory Visit: Payer: BC Managed Care – PPO | Admitting: Family Medicine
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
## 2012-06-28 ENCOUNTER — Ambulatory Visit (INDEPENDENT_AMBULATORY_CARE_PROVIDER_SITE_OTHER): Payer: BC Managed Care – PPO | Admitting: Family Medicine

## 2012-06-28 ENCOUNTER — Encounter: Payer: Self-pay | Admitting: Family Medicine

## 2012-06-28 VITALS — BP 122/86 | HR 77 | Wt 227.0 lb

## 2012-06-28 DIAGNOSIS — R1013 Epigastric pain: Secondary | ICD-10-CM

## 2012-06-28 DIAGNOSIS — IMO0001 Reserved for inherently not codable concepts without codable children: Secondary | ICD-10-CM

## 2012-06-28 DIAGNOSIS — M797 Fibromyalgia: Secondary | ICD-10-CM

## 2012-06-28 DIAGNOSIS — R229 Localized swelling, mass and lump, unspecified: Secondary | ICD-10-CM

## 2012-06-28 DIAGNOSIS — R222 Localized swelling, mass and lump, trunk: Secondary | ICD-10-CM

## 2012-06-28 DIAGNOSIS — Z853 Personal history of malignant neoplasm of breast: Secondary | ICD-10-CM

## 2012-06-28 DIAGNOSIS — R110 Nausea: Secondary | ICD-10-CM

## 2012-06-28 LAB — CBC WITH DIFFERENTIAL/PLATELET
Basophils Absolute: 0 10*3/uL (ref 0.0–0.1)
Basophils Relative: 1 % (ref 0–1)
Eosinophils Absolute: 0.1 10*3/uL (ref 0.0–0.7)
MCH: 30.9 pg (ref 26.0–34.0)
MCHC: 34.3 g/dL (ref 30.0–36.0)
Monocytes Absolute: 0.4 10*3/uL (ref 0.1–1.0)
Monocytes Relative: 9 % (ref 3–12)
Neutro Abs: 2.1 10*3/uL (ref 1.7–7.7)
Neutrophils Relative %: 53 % (ref 43–77)
RDW: 14.6 % (ref 11.5–15.5)

## 2012-06-28 LAB — COMPLETE METABOLIC PANEL WITH GFR
ALT: 17 U/L (ref 0–35)
Albumin: 4.3 g/dL (ref 3.5–5.2)
CO2: 26 mEq/L (ref 19–32)
Chloride: 103 mEq/L (ref 96–112)
GFR, Est African American: 66 mL/min
GFR, Est Non African American: 58 mL/min — ABNORMAL LOW
Glucose, Bld: 101 mg/dL — ABNORMAL HIGH (ref 70–99)
Potassium: 4.6 mEq/L (ref 3.5–5.3)
Sodium: 137 mEq/L (ref 135–145)
Total Protein: 6.8 g/dL (ref 6.0–8.3)

## 2012-06-28 LAB — LIPASE: Lipase: 26 U/L (ref 0–75)

## 2012-06-28 LAB — AMYLASE: Amylase: 48 U/L (ref 0–105)

## 2012-06-28 MED ORDER — CYCLOBENZAPRINE HCL 10 MG PO TABS: 5.0000 mg | ORAL_TABLET | Freq: Every evening | ORAL | Status: AC | PRN

## 2012-06-28 MED ORDER — HYDROCODONE-ACETAMINOPHEN 5-325 MG PO TABS: 1.0000 | ORAL_TABLET | Freq: Four times a day (QID) | ORAL | Status: DC | PRN
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
## 2012-06-28 NOTE — Patient Instructions (Addendum)
Stop all NSAIDs.   Use hydrocodone as needed every 6 hours.  Try muscle relaxer at bedtime. Can try half or whole tab.   We will work on the referrral  Take dexilant before breakfast for 2 weeks.

## 2012-06-29 NOTE — Addendum Note (Signed)
Addended by: Deno Etienne on: 06/29/2012 12:40 PM   Modules accepted: Orders

## 2012-07-12 ENCOUNTER — Ambulatory Visit (INDEPENDENT_AMBULATORY_CARE_PROVIDER_SITE_OTHER): Payer: BC Managed Care – PPO

## 2012-07-12 ENCOUNTER — Other Ambulatory Visit: Payer: BC Managed Care – PPO

## 2012-07-12 ENCOUNTER — Encounter: Payer: Self-pay | Admitting: Family Medicine

## 2012-07-12 ENCOUNTER — Ambulatory Visit (INDEPENDENT_AMBULATORY_CARE_PROVIDER_SITE_OTHER): Payer: BC Managed Care – PPO | Admitting: Family Medicine

## 2012-07-12 VITALS — BP 133/76 | HR 81 | Wt 229.0 lb

## 2012-07-12 DIAGNOSIS — M797 Fibromyalgia: Secondary | ICD-10-CM

## 2012-07-12 DIAGNOSIS — R110 Nausea: Secondary | ICD-10-CM

## 2012-07-12 DIAGNOSIS — R109 Unspecified abdominal pain: Secondary | ICD-10-CM

## 2012-07-12 DIAGNOSIS — D0590 Unspecified type of carcinoma in situ of unspecified breast: Secondary | ICD-10-CM

## 2012-07-12 DIAGNOSIS — M542 Cervicalgia: Secondary | ICD-10-CM

## 2012-07-12 DIAGNOSIS — Z853 Personal history of malignant neoplasm of breast: Secondary | ICD-10-CM

## 2012-07-12 DIAGNOSIS — IMO0001 Reserved for inherently not codable concepts without codable children: Secondary | ICD-10-CM

## 2012-07-12 DIAGNOSIS — D0512 Intraductal carcinoma in situ of left breast: Secondary | ICD-10-CM

## 2012-07-12 MED ORDER — IOHEXOL 300 MG/ML  SOLN
100.0000 mL | Freq: Once | INTRAMUSCULAR | Status: AC | PRN
  Administered 2012-07-12: 100 mL via INTRAVENOUS

## 2012-07-12 MED ORDER — DULOXETINE HCL 30 MG PO CPEP: 90.0000 mg | ORAL_CAPSULE | Freq: Every day | ORAL | Status: DC

## 2012-07-12 NOTE — Progress Notes (Signed)
Subjective:    Patient ID: Margaret Beasley, female    DOB: [birth year], 57 y.o.   MRN: [id number]  HPId Abdominal pain - her epigastric pain has improved significantly on the Dexilant. She has had one more episode of severe peri-umbilical pain in the last 2 weeks. The pain radiated right and left to the umbilicus. She's also had some pinpoint tenderness to the left side of her abdomen. Just above the left lower quadrant. Has gained 2 lbs in 2 weeks.  Woke up with some neck pain today. She unfortunately keeps getting severe muscle spasms for her fibromyalgia. She did have some degenerative arthritis in her neck on recent plain film x-rays. Has some ringing in her righ hear. The flexeril at bedtime has been helping.  Did improve with the dexilant.  Hx of endometriosis.  Had tubal ligations She is very worried about ovarian cancer bc of hx of BrCa. Feels her abdomen is getting more distended.    Fibromyalgia-she's been having more flares especially with her neck. 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 She feels the Cymbalta is no longer providing any pain really for her.  Review of Systems History of breast cancer. She did not undergo radiation therapy or take Arimidex.    Objective:   Physical Exam  Constitutional: She is oriented to person, place, and time. She appears well-developed and well-nourished.  HENT:  Head: Normocephalic and atraumatic.  Cardiovascular: Normal rate, regular rhythm and normal heart sounds.   Pulmonary/Chest: Effort normal and breath sounds normal.  Abdominal: Soft. Bowel sounds are normal. She exhibits no distension and no mass. There is tenderness. There is no rebound and no guarding.  She does have a spot on the left mid abdomen that is very tender to palpation. He felt little bit of firmness deep within the tissue. Am unsure if this is actually a hernia or a deep tissue cyst.  Musculoskeletal:   Neck with normal range of motion but she does have some spasm of the muscle tissue along the lower cervical spine along the paraspinous region.  Neurological: She is alert and oriented to person, place, and time.  Skin: Skin is warm and dry.  Psychiatric: She has a normal mood and affect. Her behavior is normal.          Assessment & Plan:  Abdominal pain - overall her pain is significantly improved with the dexilant.  But is still having occasional spasms with some pain in the left lower quadrant. He is also noticed some increased abdominal girth as well as some intermittent nausea. It like to move forward with a CT of the abdomen and pelvis to better evaluate this area. Also mildly concerned that she might have an abdominal hernia to the left side of the abdominal wall and would like to investigate this further as well. She does have a history of breast cancer. She was not treated with radiation and Arimidex.  Neck pain-I am able to palpate some spasms around the paraspinous tissue of the lower cervical spine. Work on gentle stretches, heating pad and if she can get her husband to massage the area that could be heard helpful.  Fibromyalgia-unfortunately she is really been flaring of the last couple months. I recommend name of the FDA approved drug for her myalgia cough developed. Encouraged her call my rebound and see if it something she is interested in. We did  discuss potential side effects of the medication.  History of breast cancer-she's actually due for her followup mammogram and exam in about one to 2 weeks.

## 2012-07-12 NOTE — Patient Instructions (Signed)
Go online and read about Amada Jupiter as an option.

## 2012-07-14 ENCOUNTER — Telehealth: Payer: Self-pay

## 2012-07-14 NOTE — Telephone Encounter (Signed)
She can try taking 2 of the hydrocodone but this is the max. She can see if there is a week and that gives her a little bit more relief. Does remind her that pain medications we'll not 100% relieve her pain. In fact optimal is to reduce pain to about 30% so that she is still functional but will not be completely pain-free.

## 2012-07-14 NOTE — Telephone Encounter (Signed)
Patient advised.

## 2012-07-14 NOTE — Telephone Encounter (Signed)
Patient called and left a message on the voice mail stating she needs something stronger for her pain. Hydrocodone and Flexeril is not working.

## 2012-07-19 ENCOUNTER — Other Ambulatory Visit: Payer: BC Managed Care – PPO

## 2012-07-24 ENCOUNTER — Ambulatory Visit: Payer: BC Managed Care – PPO | Admitting: Family Medicine

## 2012-08-15 ENCOUNTER — Ambulatory Visit: Payer: BC Managed Care – PPO | Admitting: Family Medicine

## 2012-10-02 ENCOUNTER — Ambulatory Visit
Admission: RE | Admit: 2012-10-02 | Discharge: 2012-10-02 | Disposition: A | Discharge status: Home / Self Care | Payer: BC Managed Care – PPO | Source: Ambulatory Visit | Attending: Family Medicine | Admitting: Family Medicine

## 2012-10-02 DIAGNOSIS — Z853 Personal history of malignant neoplasm of breast: Secondary | ICD-10-CM

## 2012-10-02 DIAGNOSIS — R222 Localized swelling, mass and lump, trunk: Secondary | ICD-10-CM

## 2012-10-10 ENCOUNTER — Telehealth: Payer: Self-pay | Admitting: *Deleted

## 2012-10-10 NOTE — Telephone Encounter (Signed)
If in the thumb then put on Dr. Karie Schwalbe schedule for small joint injection

## 2012-10-10 NOTE — Telephone Encounter (Signed)
Pt states her right thumb is "killing her" has arthritis in both but right is worse.  Was wanting to know if she could come in and see you and get a cortisone shot?  Sees ortho on 10/25/2012.

## 2012-10-10 NOTE — Telephone Encounter (Signed)
Patient scheduled.

## 2012-10-12 ENCOUNTER — Institutional Professional Consult (permissible substitution): Payer: BC Managed Care – PPO | Admitting: Sports Medicine

## 2012-10-12 DIAGNOSIS — G4700 Insomnia, unspecified: Secondary | ICD-10-CM | POA: Insufficient documentation

## 2012-12-11 ENCOUNTER — Telehealth: Payer: Self-pay

## 2012-12-11 MED ORDER — DULOXETINE HCL 30 MG PO CPEP: 90.0000 mg | ORAL_CAPSULE | Freq: Every day | ORAL | Status: DC

## 2012-12-11 NOTE — Telephone Encounter (Signed)
Sent refill to pharmacy for Cymbalta.

## 2013-01-30 ENCOUNTER — Ambulatory Visit: Payer: BC Managed Care – PPO | Admitting: Family Medicine

## 2013-03-09 ENCOUNTER — Other Ambulatory Visit: Payer: Self-pay | Admitting: Family Medicine

## 2013-05-23 ENCOUNTER — Ambulatory Visit (INDEPENDENT_AMBULATORY_CARE_PROVIDER_SITE_OTHER): Payer: BC Managed Care – PPO

## 2013-05-23 ENCOUNTER — Ambulatory Visit (INDEPENDENT_AMBULATORY_CARE_PROVIDER_SITE_OTHER): Payer: BC Managed Care – PPO | Admitting: Family Medicine

## 2013-05-23 ENCOUNTER — Encounter: Payer: Self-pay | Admitting: Family Medicine

## 2013-05-23 ENCOUNTER — Other Ambulatory Visit: Payer: Self-pay | Admitting: Family Medicine

## 2013-05-23 VITALS — BP 134/87 | HR 84 | Ht 63.0 in | Wt 232.0 lb

## 2013-05-23 DIAGNOSIS — M7989 Other specified soft tissue disorders: Secondary | ICD-10-CM

## 2013-05-23 DIAGNOSIS — R251 Tremor, unspecified: Secondary | ICD-10-CM

## 2013-05-23 DIAGNOSIS — R5381 Other malaise: Secondary | ICD-10-CM

## 2013-05-23 DIAGNOSIS — R259 Unspecified abnormal involuntary movements: Secondary | ICD-10-CM

## 2013-05-23 DIAGNOSIS — M2550 Pain in unspecified joint: Secondary | ICD-10-CM

## 2013-05-23 DIAGNOSIS — M5410 Radiculopathy, site unspecified: Secondary | ICD-10-CM

## 2013-05-23 DIAGNOSIS — R5383 Other fatigue: Principal | ICD-10-CM

## 2013-05-23 DIAGNOSIS — M545 Low back pain, unspecified: Secondary | ICD-10-CM

## 2013-05-23 DIAGNOSIS — M79609 Pain in unspecified limb: Secondary | ICD-10-CM

## 2013-05-23 DIAGNOSIS — IMO0002 Reserved for concepts with insufficient information to code with codable children: Secondary | ICD-10-CM

## 2013-05-23 DIAGNOSIS — M542 Cervicalgia: Secondary | ICD-10-CM

## 2013-05-23 LAB — COMPLETE METABOLIC PANEL WITH GFR
ALT: 16 U/L (ref 0–35)
AST: 18 U/L (ref 0–37)
Albumin: 4.1 g/dL (ref 3.5–5.2)
Alkaline Phosphatase: 68 U/L (ref 39–117)
BUN: 11 mg/dL (ref 6–23)
CALCIUM: 9.1 mg/dL (ref 8.4–10.5)
CHLORIDE: 101 meq/L (ref 96–112)
CO2: 30 meq/L (ref 19–32)
CREATININE: 1.05 mg/dL (ref 0.50–1.10)
GFR, EST AFRICAN AMERICAN: 68 mL/min
GFR, EST NON AFRICAN AMERICAN: 59 mL/min — AB
Glucose, Bld: 93 mg/dL (ref 70–99)
Potassium: 4.7 mEq/L (ref 3.5–5.3)
Sodium: 138 mEq/L (ref 135–145)
Total Bilirubin: 0.5 mg/dL (ref 0.2–1.2)
Total Protein: 6.8 g/dL (ref 6.0–8.3)

## 2013-05-23 LAB — FOLATE: Folate: 8.4 ng/mL

## 2013-05-23 LAB — LIPID PANEL
CHOLESTEROL: 388 mg/dL — AB (ref 0–200)
HDL: 47 mg/dL (ref >39)
LDL Cholesterol: 270 mg/dL — ABNORMAL HIGH (ref 0–99)
TRIGLYCERIDES: 357 mg/dL — AB (ref <150)
Total CHOL/HDL Ratio: 8.3 Ratio
VLDL: 71 mg/dL — AB (ref 0–40)

## 2013-05-23 LAB — VITAMIN B12: Vitamin B-12: 1855 pg/mL — ABNORMAL HIGH (ref 211–911)

## 2013-05-23 LAB — RHEUMATOID FACTOR

## 2013-05-23 LAB — C-REACTIVE PROTEIN

## 2013-05-23 LAB — SEDIMENTATION RATE: Sed Rate: 16 mm/hr (ref 0–22)

## 2013-05-23 LAB — CK: Total CK: 83 U/L (ref 7–177)

## 2013-05-23 LAB — FERRITIN: Ferritin: 39 ng/mL (ref 10–291)

## 2013-05-23 LAB — MAGNESIUM: MAGNESIUM: 2.2 mg/dL (ref 1.5–2.5)

## 2013-05-23 LAB — TSH: TSH: 6.963 u[IU]/mL — AB (ref 0.350–4.500)

## 2013-05-23 MED ORDER — TRAMADOL HCL 50 MG PO TABS: 50.0000 mg | ORAL_TABLET | Freq: Three times a day (TID) | ORAL | Status: DC | PRN

## 2013-05-23 NOTE — Progress Notes (Signed)
Subjective:    Patient ID: Margaret Beasley, female    DOB: 05-24-1955, 58 y.o.   MRN: [id number]  HPI Vision is more blurry and has intermittant nausea. Occ loses her balance.  Hx of RLS.  Having easy bruising. Has painin the left ankle, almost like how it feels when hit the funny bone. Occ waking up with stining pain up the shin.  Having shaking in hands but worse on the right. Extremely dry throat and nose. She constantly has pain in her hands. She's also noticed a tremor in her hands. She says when it was shaking so bad she could barely per her coffee cup in the trash. She often has swelling over the MCPs as well. Also complains of extreme fatigue. She's also been noticing some tingling especially in the bottom of her right foot. Has happened once on the left but mostly affects the right. She occasionally has pain that radiates down her entire right leg. She constantly has problems with her back. And feels knots in her neck. She did recently have an MRI across a copy of the MRI report which did show some small central disc protrusion at C6-C7 effacing the ventral thecal sac. Foramina patent. Also at C5-C6 there is minimal effacement of the ventral thecal sac as well. The foramen were patent. This MRI was performed in January of 2014, over a year ago. She also brought me a copy of her nerve conduction study done on her upper extremities. The testing was consistent with chronic right C6 and C7 radiculopathies. There was no evidence of right median R. [place] neuropathy. She does have followed her with neurology seen. She has seen a rheumatologist or locally about 3 times but felt like they really haven't him a lot have not been listing to her. Evidently she was told that she had a positive rheumatoid factor. She's not sure if she had an anti-CCP checked. She also denies having had any x-rays done of her hands.   Review of Systems     Objective:   Physical Exam  Constitutional: She is oriented to  person, place, and time. She appears well-developed and well-nourished.  HENT:  Head: Normocephalic and atraumatic.  Eyes: Conjunctivae and EOM are normal.  Cardiovascular: Normal rate.   Pulmonary/Chest: Effort normal.  Neurological: She is alert and oriented to person, place, and time.  Skin: Skin is warm and dry. No pallor.  Psychiatric: She has a normal mood and affect. Her behavior is normal.          Assessment & Plan:  Polyarthralgia with positive rheumatoid factor-like get plain film x-rays of the hands today. Also check anti-CCP as well as repeat sedimentation rate and CRP. Also consider referral to the rheumatologist for second opinion.  Tremor-unclear etiology. Will check B12, iron, magnesium etc. Also consider this could be essential tremor.  Fatigue-we'll recheck thyroid, check for anemia et Ronney Asters. She has had a prior diagnosis of fibromyalgia but I'm wondering if she really has more of an autoimmune process going on. She does have a family history of rheumatoid arthritis. She also has a history of B12 deficiency and depression.  Right leg radicular symptoms-would like to get an x-ray of the lumbar spine. She's describing is really related to radiculopathy. She definitely has radiculopathy in the cervical spine so not unlikely to also have in her lumbar spine as well. I would like her to try tramadol for pain relief on top of her over-the-counter arthritis pain reliever which she  takes on a regular basis. It is not providing adequate pain relief then can adjust regimen.  Since she has so many issues going on right now I would like to see her back in one week so that we can followup and address each and to individually.  Time spent 30 minutes, greater than 50% time spent counseling the out polyarthralgia and possibility of rheumatoid arthritis.

## 2013-05-24 ENCOUNTER — Other Ambulatory Visit: Payer: Self-pay | Admitting: Family Medicine

## 2013-05-24 ENCOUNTER — Telehealth: Payer: Self-pay | Admitting: *Deleted

## 2013-05-24 DIAGNOSIS — M5410 Radiculopathy, site unspecified: Secondary | ICD-10-CM

## 2013-05-24 LAB — VITAMIN D 25 HYDROXY (VIT D DEFICIENCY, FRACTURES): Vit D, 25-Hydroxy: 26 ng/mL — ABNORMAL LOW (ref 30–89)

## 2013-05-24 LAB — CYCLIC CITRUL PEPTIDE ANTIBODY, IGG: Cyclic Citrullin Peptide Ab: <2 U/mL (ref 0.0–5.0)

## 2013-05-24 LAB — T4, FREE: Free T4: 1.01 ng/dL (ref 0.80–1.80)

## 2013-05-24 LAB — T3, FREE: T3, Free: 2.5 pg/mL (ref 2.3–4.2)

## 2013-05-24 LAB — ANA: ANA: NEGATIVE

## 2013-05-24 MED ORDER — SIMVASTATIN 40 MG PO TABS: 40.0000 mg | ORAL_TABLET | Freq: Every day | ORAL | Status: DC

## 2013-05-24 NOTE — Telephone Encounter (Signed)
Per [facility] no precert required for MRI Lumbar Spine w/o contrast.  Reference # [id number]. Clemetine Marker, LPN

## 2013-05-26 ENCOUNTER — Ambulatory Visit (HOSPITAL_BASED_OUTPATIENT_CLINIC_OR_DEPARTMENT_OTHER)
Admission: RE | Admit: 2013-05-26 | Discharge: 2013-05-26 | Disposition: A | Discharge status: Home / Self Care | Payer: BC Managed Care – PPO | Source: Ambulatory Visit | Attending: Family Medicine | Admitting: Family Medicine

## 2013-05-26 DIAGNOSIS — M545 Low back pain, unspecified: Secondary | ICD-10-CM | POA: Insufficient documentation

## 2013-05-26 DIAGNOSIS — M5410 Radiculopathy, site unspecified: Secondary | ICD-10-CM

## 2013-05-30 ENCOUNTER — Encounter: Payer: Self-pay | Admitting: Family Medicine

## 2013-05-30 ENCOUNTER — Ambulatory Visit (INDEPENDENT_AMBULATORY_CARE_PROVIDER_SITE_OTHER): Payer: BC Managed Care – PPO | Admitting: Family Medicine

## 2013-05-30 VITALS — BP 123/80 | HR 80 | Ht 63.0 in | Wt 232.0 lb

## 2013-05-30 DIAGNOSIS — M2550 Pain in unspecified joint: Secondary | ICD-10-CM

## 2013-05-30 DIAGNOSIS — R5383 Other fatigue: Secondary | ICD-10-CM

## 2013-05-30 DIAGNOSIS — R5381 Other malaise: Secondary | ICD-10-CM

## 2013-05-30 MED ORDER — PREDNISONE 20 MG PO TABS: ORAL_TABLET | ORAL | Status: DC

## 2013-05-30 NOTE — Progress Notes (Signed)
   Subjective:    Patient ID: Margaret Beasley, female    DOB: 1955/08/15, 58 y.o.   MRN: [id number]  HPI  F/u joint pain - says today she is hurting in multiple joints including her knees, ankles, wrists and at the bases of both thumbs. She says this just feels very different from her fibromyalgia. This really feels like it's deeper in the joint tissue versus in the muscle tissue. Typically when her fibromyalgia flares she experiences more of a pinching sensation in the muscle tissue. She still not sure what's going on is here with her husband to go over all her lab results today. She is definitely interested in getting a second opinion from rheumatology. She also had x-rays of the hands done to review today as well. She says it becomes a difficult to slowly difficult to get out of bed some days. She denies feeling depressed or sad right now. She feels severely fatigued.  Wants to go over MRI results as well. Got a copy of the CD to give to her neurology    Review of Systems     Objective:   Physical Exam  Constitutional: She is oriented to person, place, and time. She appears well-developed and well-nourished.  HENT:  Head: Normocephalic and atraumatic.  Eyes: Conjunctivae and EOM are normal.  Cardiovascular: Normal rate.   Pulmonary/Chest: Effort normal.  Neurological: She is alert and oriented to person, place, and time.  Skin: Skin is dry. No pallor.  Psychiatric: She has a normal mood and affect. Her behavior is normal.          Assessment & Plan:  Polyarthralgia-will refer to rheumatologist for further evaluation. I do agree with her that something is going on. I do suspect an autoimmune disorder but not exactly sure what it is. Her rheumatoid factor anti-CCP were negative. I do think she has had a positive rheumatoid factor in the past. Sedimentation rate and CRP are in the normal range. Her ANA test is negative as well. We also reviewed the results of her x-rays of her hands.  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 I would like to put her on a short course of prednisone to see if this helps with general inflammation in the joints in the body. I like to see if she has a good response to this with her joint pain. I agree this doesn't sound consistent with typical fibromyalgia flare.  We also reviewed the results of her MRI that Dr. Dianah Field had ordered. She is getting the film on CD to take to her neurologist for her next followup appointment.  Time spent  30 minutes, greater than his recent of the time spent counseling about polyarthralgia, in reviewing her lab results and imaging results.

## 2013-06-01 ENCOUNTER — Telehealth: Payer: Self-pay | Admitting: *Deleted

## 2013-06-01 NOTE — Telephone Encounter (Signed)
Pt called and wanted to inform Dr. Madilyn Fireman that she is doing well on the prednisone.Margaret Beasley

## 2013-06-04 ENCOUNTER — Telehealth: Payer: Self-pay | Admitting: *Deleted

## 2013-06-04 NOTE — Telephone Encounter (Signed)
Pt called and stated that the cholesterol medication gave her a HA and that the predinsone is now causing her to have pain in her neck, spine and both of her palms at the thumbs. Please advise.Teddy Spike

## 2013-06-04 NOTE — Telephone Encounter (Signed)
I am confused. She was RE having pain in those areas which is why I put her on the prednisone. But certainly if she feels like it's making things worse then we can stop it. I would also like her to consider seeing our sports medicine doctor for the joint pain. Please let me know if she is okay with doing that. We can always retry the cholesterol medication later.

## 2013-06-05 NOTE — Telephone Encounter (Signed)
Pt informed. She stated that it is not due to the prednisone and she has d/c'd the cholesterol med.Teddy Spike

## 2013-06-08 NOTE — Telephone Encounter (Signed)
Please see Visit Info comments 

## 2013-06-21 ENCOUNTER — Telehealth: Payer: Self-pay | Admitting: *Deleted

## 2013-06-21 NOTE — Telephone Encounter (Signed)
Might be better to have OV to go over. We are going to follow her kidneys carefully. Will repeat in 4 months.

## 2013-06-21 NOTE — Telephone Encounter (Signed)
Pt's husband called today stating that the pt is freaking out about her GFR result & thinks that her kidneys are shutting down.  I tried my best to explain to the husband that this # was actually low & that her creatnine was perfectly normal.  Do you have a better way for me to break this down to the pt?  Please advise

## 2013-06-25 NOTE — Telephone Encounter (Signed)
Unable to reach patient due to voicemail hasn't been set up.

## 2013-06-25 NOTE — Telephone Encounter (Signed)
Patient advised and she has an office visit scheduled for Friday.

## 2013-06-29 ENCOUNTER — Ambulatory Visit (INDEPENDENT_AMBULATORY_CARE_PROVIDER_SITE_OTHER): Payer: BC Managed Care – PPO | Admitting: Family Medicine

## 2013-06-29 ENCOUNTER — Encounter: Payer: Self-pay | Admitting: Family Medicine

## 2013-06-29 VITALS — BP 128/81 | HR 88 | Wt 230.0 lb

## 2013-06-29 DIAGNOSIS — N1832 Chronic kidney disease, stage 3b: Secondary | ICD-10-CM

## 2013-06-29 DIAGNOSIS — N183 Chronic kidney disease, stage 3 unspecified: Secondary | ICD-10-CM

## 2013-06-29 DIAGNOSIS — N182 Chronic kidney disease, stage 2 (mild): Secondary | ICD-10-CM | POA: Insufficient documentation

## 2013-06-29 DIAGNOSIS — N289 Disorder of kidney and ureter, unspecified: Secondary | ICD-10-CM

## 2013-06-29 DIAGNOSIS — N1831 Chronic kidney disease, stage 3a: Secondary | ICD-10-CM | POA: Insufficient documentation

## 2013-06-29 HISTORY — DX: Chronic kidney disease, stage 3b: N18.32

## 2013-06-29 LAB — POCT URINALYSIS DIPSTICK
BILIRUBIN UA: NEGATIVE
Blood, UA: NEGATIVE
GLUCOSE UA: NEGATIVE
KETONES UA: NEGATIVE
LEUKOCYTES UA: NEGATIVE
NITRITE UA: NEGATIVE
Protein, UA: NEGATIVE
Spec Grav, UA: <=1.005
Urobilinogen, UA: 0.2
pH, UA: 6

## 2013-06-29 LAB — POCT UA - MICROALBUMIN
CREATININE, POC: 50 mg/dL
MICROALBUMIN (UR) POC: 10 mg/L

## 2013-06-29 NOTE — Progress Notes (Signed)
   Subjective:    Patient ID: Margaret Beasley, female    DOB: 1955/07/05, 58 y.o.   MRN: [id number]  HPI Here to discuss her kidney function.  She is very upset she is CKD 3 and would like to discuss it. She has noted some changes to her urine recently.  Urine looks like green tea. Orange looks orange at times as well. Just started a few weeks ago. No dysuria or hematuria. No fever, chillls, or sweats  Review of Systems     Objective:   Physical Exam  Constitutional: She appears well-developed and well-nourished.  HENT:  Head: Normocephalic and atraumatic.  Skin: Skin is warm and dry.  Psychiatric: She has a normal mood and affect. Her behavior is normal.          Assessment & Plan:  CKD 3 - I reviewed this diagnosis with her. She overall has been fairly stable for quite some time. We will monitor the progression. We will repeat her kidney function in 3-4 months and follow her very carefully. Since she has noticed some actual changes to the color of her urine will check some additional things today. We did do a urinalysis which was negative for blood, or protein. We also did a urine micron been in and it was negative for microscopic amounts of protein. Will send for microscopic analysis as well as a culture just to be sure. We did explain to her that certain medications including vitamins and supplements can sometimes change the color of urine and this can be somewhat normal. We will followup at that time. We do not need to renally adjust any of her medications. She does not need a referral to a nephrologist at this point in time. If it starts to progress or she has persistent proteinuria or hematuria then we will evaluating further.

## 2013-06-30 LAB — URINALYSIS, MICROSCOPIC ONLY
BACTERIA UA: NONE SEEN
CASTS: NONE SEEN
Crystals: NONE SEEN
Squamous Epithelial / LPF: NONE SEEN

## 2013-07-01 LAB — URINE CULTURE
COLONY COUNT: NO GROWTH
Organism ID, Bacteria: NO GROWTH

## 2013-07-05 ENCOUNTER — Telehealth: Payer: Self-pay | Admitting: *Deleted

## 2013-07-05 NOTE — Telephone Encounter (Signed)
Pt called and stated that the livalo has exacerbated her fibromyalgia sxs her L ankle has been swelling, she is having issues with sleep. She is has to take pain Also she is asking to have her levels checked . Cbc and ast,alt levels.   Called and informed pt that she should f/u with her neurologist about exactly what testing she will need. Pt voiced understanding and was very thankful for Dr.Metheney's help.Margaret Beasley

## 2013-08-22 ENCOUNTER — Encounter: Payer: Self-pay | Admitting: Family Medicine

## 2013-08-22 ENCOUNTER — Ambulatory Visit (INDEPENDENT_AMBULATORY_CARE_PROVIDER_SITE_OTHER): Payer: BC Managed Care – PPO | Admitting: Family Medicine

## 2013-08-22 VITALS — BP 129/81 | HR 80 | Wt 232.0 lb

## 2013-08-22 DIAGNOSIS — Z23 Encounter for immunization: Secondary | ICD-10-CM

## 2013-08-22 DIAGNOSIS — Z6841 Body Mass Index (BMI) 40.0 and over, adult: Secondary | ICD-10-CM

## 2013-08-22 DIAGNOSIS — E669 Obesity, unspecified: Secondary | ICD-10-CM

## 2013-08-22 DIAGNOSIS — N183 Chronic kidney disease, stage 3 unspecified: Secondary | ICD-10-CM

## 2013-08-22 DIAGNOSIS — N1832 Chronic kidney disease, stage 3b: Secondary | ICD-10-CM

## 2013-08-22 DIAGNOSIS — E559 Vitamin D deficiency, unspecified: Secondary | ICD-10-CM

## 2013-08-22 DIAGNOSIS — R7989 Other specified abnormal findings of blood chemistry: Secondary | ICD-10-CM

## 2013-08-22 DIAGNOSIS — E785 Hyperlipidemia, unspecified: Secondary | ICD-10-CM

## 2013-08-22 MED ORDER — OMEPRAZOLE 40 MG PO CPDR: 40.0000 mg | DELAYED_RELEASE_CAPSULE | Freq: Every day | ORAL | Status: DC

## 2013-08-22 MED ORDER — SIMVASTATIN 40 MG PO TABS: 40.0000 mg | ORAL_TABLET | Freq: Every day | ORAL | Status: DC

## 2013-08-22 MED ORDER — CYCLOBENZAPRINE HCL 5 MG PO TABS: 5.0000 mg | ORAL_TABLET | Freq: Every day | ORAL | Status: DC

## 2013-08-22 MED ORDER — DULOXETINE HCL 20 MG PO CPEP: 20.0000 mg | ORAL_CAPSULE | Freq: Every day | ORAL | Status: DC

## 2013-08-22 NOTE — Patient Instructions (Signed)
[employer].   We will keep a close eye on your kidneys by checking them every 3-4 months. We will call with her lab results hopefully on Friday.

## 2013-08-22 NOTE — Progress Notes (Signed)
   Subjective:    Patient ID: Margaret Beasley, female    DOB: 1955-02-25, 58 y.o.   MRN: [id number]  HPI Hyperlipidemia-her last set of lipids were quite elevated. We did contact her about starting a statin. She was okay with that. We sent to the pharmacy and she did take it once. She got a headache afterward so discontinued it. She actually threw the bottle away and did not take it.  Vitamin D deficiency-he has been taking a vitamin D supplement for him is 3 months. Her levels were low but low when we checked them.  She did see Dr. Charlynn Grimes.  She says that she has been having some seizure activity in her brain. He is doing some additional blood work and is checking her liver. She said she is now worried about her liver.  CKD 3-she wants to know exactly what stage III kidney disease as and what that means and how it impacts her. She has no history of diabetes or high blood pressure but does have a history of chronic NSAID use.  Abnormal TSH-on her blood work her TSH was elevated but free T4 and free T3 were normal. She has had some fatigue and weight gain. No recent skin or hair changes.  Review of Systems     Objective:   Physical Exam  Constitutional: She is oriented to person, place, and time. She appears well-developed and well-nourished.  HENT:  Head: Normocephalic and atraumatic.  Eyes: Conjunctivae and EOM are normal.  Cardiovascular: Normal rate.   Pulmonary/Chest: Effort normal.  Neurological: She is alert and oriented to person, place, and time.  Skin: Skin is dry. No pallor.  Psychiatric: She has a normal mood and affect. Her behavior is normal.          Assessment & Plan:  Hyperlipidemia-after much discussion she says she's willing to try the statin again. Also encouraged her to consider starting with half of a tab. If she tolerates that well then consider increasing to a whole. When she's been on it for 4-8 weeks out like to recheck her lipids and liver enzymes.  CKD  3-we'll recheck kidney function today. We had a long discussion with her mother who is here for the visit as well about what that means and the impact. We discussed the importance of minimizing use of NSAIDs and what the alternatives for pain control.  Vitamin D deficiency-we'll recheck levels today.  Abnormal TSH-will repeat check TSH today. Explained her that sometimes a TSH will go back to normal. He can change depending on what's going on with her body.  Or it may be a sign that she might be becoming hypothyroid.  Obesity/BMI 41- we discussed the Smart complication call [employer] to help her set goals for caloric intake and working on regular graded exercise and she does have fibromyalgia. She says her brother has a poor she might be able to use his pool for exercise. We discussed strategies to do this.

## 2013-08-23 LAB — COMPLETE METABOLIC PANEL WITH GFR
ALK PHOS: 72 U/L (ref 39–117)
ALT: 15 U/L (ref 0–35)
AST: 19 U/L (ref 0–37)
Albumin: 4 g/dL (ref 3.5–5.2)
BILIRUBIN TOTAL: 0.5 mg/dL (ref 0.2–1.2)
BUN: 11 mg/dL (ref 6–23)
CHLORIDE: 104 meq/L (ref 96–112)
CO2: 29 mEq/L (ref 19–32)
CREATININE: 1.1 mg/dL (ref 0.50–1.10)
Calcium: 9.2 mg/dL (ref 8.4–10.5)
GFR, Est African American: 64 mL/min
GFR, Est Non African American: 56 mL/min — ABNORMAL LOW
Glucose, Bld: 105 mg/dL — ABNORMAL HIGH (ref 70–99)
Potassium: 5.2 mEq/L (ref 3.5–5.3)
Sodium: 140 mEq/L (ref 135–145)
Total Protein: 6.7 g/dL (ref 6.0–8.3)

## 2013-08-23 LAB — T3, FREE: T3 FREE: 2.4 pg/mL (ref 2.3–4.2)

## 2013-08-23 LAB — TSH: TSH: 4.118 u[IU]/mL (ref 0.350–4.500)

## 2013-08-23 LAB — T4, FREE: FREE T4: 0.93 ng/dL (ref 0.80–1.80)

## 2013-08-24 ENCOUNTER — Other Ambulatory Visit: Payer: Self-pay | Admitting: *Deleted

## 2013-08-24 DIAGNOSIS — N183 Chronic kidney disease, stage 3 unspecified: Secondary | ICD-10-CM

## 2013-08-24 LAB — HEPATITIS PANEL, ACUTE
HCV AB: NEGATIVE
HEP B S AG: NEGATIVE
Hep A IgM: NONREACTIVE
Hep B C IgM: NONREACTIVE

## 2013-08-24 LAB — VITAMIN D 25 HYDROXY (VIT D DEFICIENCY, FRACTURES): VIT D 25 HYDROXY: 49 ng/mL (ref 30–89)

## 2013-08-30 ENCOUNTER — Ambulatory Visit: Payer: BC Managed Care – PPO | Admitting: Family Medicine

## 2013-09-03 ENCOUNTER — Other Ambulatory Visit: Payer: Self-pay | Admitting: Family Medicine

## 2013-09-03 DIAGNOSIS — Z853 Personal history of malignant neoplasm of breast: Secondary | ICD-10-CM

## 2013-09-28 ENCOUNTER — Other Ambulatory Visit: Payer: Self-pay | Admitting: Family Medicine

## 2013-09-28 ENCOUNTER — Other Ambulatory Visit: Payer: Self-pay

## 2013-09-28 DIAGNOSIS — Z853 Personal history of malignant neoplasm of breast: Secondary | ICD-10-CM

## 2013-10-04 ENCOUNTER — Ambulatory Visit
Admission: RE | Admit: 2013-10-04 | Discharge: 2013-10-04 | Disposition: A | Discharge status: Home / Self Care | Payer: BC Managed Care – PPO | Source: Ambulatory Visit | Attending: Family Medicine | Admitting: Family Medicine

## 2013-10-04 DIAGNOSIS — Z853 Personal history of malignant neoplasm of breast: Secondary | ICD-10-CM

## 2013-10-23 ENCOUNTER — Ambulatory Visit: Payer: BC Managed Care – PPO | Admitting: Family Medicine

## 2014-03-28 DIAGNOSIS — G250 Essential tremor: Secondary | ICD-10-CM | POA: Insufficient documentation

## 2014-03-28 DIAGNOSIS — G40309 Generalized idiopathic epilepsy and epileptic syndromes, not intractable, without status epilepticus: Secondary | ICD-10-CM | POA: Insufficient documentation

## 2014-03-28 DIAGNOSIS — R4189 Other symptoms and signs involving cognitive functions and awareness: Secondary | ICD-10-CM | POA: Insufficient documentation

## 2014-03-28 DIAGNOSIS — F411 Generalized anxiety disorder: Secondary | ICD-10-CM | POA: Insufficient documentation

## 2014-04-25 DIAGNOSIS — G253 Myoclonus: Secondary | ICD-10-CM | POA: Insufficient documentation

## 2014-04-25 DIAGNOSIS — G40209 Localization-related (focal) (partial) symptomatic epilepsy and epileptic syndromes with complex partial seizures, not intractable, without status epilepticus: Secondary | ICD-10-CM | POA: Insufficient documentation

## 2014-04-25 HISTORY — DX: Localization-related (focal) (partial) symptomatic epilepsy and epileptic syndromes with complex partial seizures, not intractable, without status epilepticus: G40.209

## 2014-04-26 ENCOUNTER — Other Ambulatory Visit: Payer: Self-pay | Admitting: Family Medicine

## 2014-04-26 ENCOUNTER — Encounter: Payer: Self-pay | Admitting: Family Medicine

## 2014-04-26 ENCOUNTER — Ambulatory Visit (INDEPENDENT_AMBULATORY_CARE_PROVIDER_SITE_OTHER): Payer: BLUE CROSS/BLUE SHIELD | Admitting: Family Medicine

## 2014-04-26 VITALS — BP 167/88 | HR 78 | Wt 233.0 lb

## 2014-04-26 DIAGNOSIS — R7989 Other specified abnormal findings of blood chemistry: Secondary | ICD-10-CM

## 2014-04-26 DIAGNOSIS — N644 Mastodynia: Secondary | ICD-10-CM | POA: Diagnosis not present

## 2014-04-26 DIAGNOSIS — R635 Abnormal weight gain: Secondary | ICD-10-CM | POA: Diagnosis not present

## 2014-04-26 DIAGNOSIS — R5383 Other fatigue: Secondary | ICD-10-CM

## 2014-04-26 MED ORDER — SIMVASTATIN 40 MG PO TABS: 40.0000 mg | ORAL_TABLET | Freq: Every day | ORAL | Status: DC
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
## 2014-04-27 LAB — CBC WITH DIFFERENTIAL/PLATELET
BASOS ABS: 0 10*3/uL (ref 0.0–0.1)
Basophils Relative: 0 % (ref 0–1)
Eosinophils Absolute: 0.2 10*3/uL (ref 0.0–0.7)
Eosinophils Relative: 3 % (ref 0–5)
HEMATOCRIT: 44.5 % (ref 36.0–46.0)
Hemoglobin: 14.8 g/dL (ref 12.0–15.0)
LYMPHS PCT: 39 % (ref 12–46)
Lymphs Abs: 2.2 10*3/uL (ref 0.7–4.0)
MCH: 30.5 pg (ref 26.0–34.0)
MCHC: 33.3 g/dL (ref 30.0–36.0)
MCV: 91.8 fL (ref 78.0–100.0)
MONO ABS: 0.6 10*3/uL (ref 0.1–1.0)
MPV: 11.1 fL (ref 8.6–12.4)
Monocytes Relative: 11 % (ref 3–12)
NEUTROS PCT: 47 % (ref 43–77)
Neutro Abs: 2.6 10*3/uL (ref 1.7–7.7)
PLATELETS: 255 10*3/uL (ref 150–400)
RBC: 4.85 MIL/uL (ref 3.87–5.11)
RDW: 14 % (ref 11.5–15.5)
WBC: 5.6 10*3/uL (ref 4.0–10.5)

## 2014-04-27 LAB — COMPLETE METABOLIC PANEL WITH GFR
ALK PHOS: 72 U/L (ref 39–117)
ALT: 17 U/L (ref 0–35)
AST: 18 U/L (ref 0–37)
Albumin: 4.3 g/dL (ref 3.5–5.2)
BILIRUBIN TOTAL: 0.5 mg/dL (ref 0.2–1.2)
BUN: 11 mg/dL (ref 6–23)
CO2: 28 mEq/L (ref 19–32)
CREATININE: 0.99 mg/dL (ref 0.50–1.10)
Calcium: 9.4 mg/dL (ref 8.4–10.5)
Chloride: 103 mEq/L (ref 96–112)
GFR, EST AFRICAN AMERICAN: 73 mL/min
GFR, EST NON AFRICAN AMERICAN: 63 mL/min
Glucose, Bld: 83 mg/dL (ref 70–99)
Potassium: 4.6 mEq/L (ref 3.5–5.3)
SODIUM: 140 meq/L (ref 135–145)
Total Protein: 7 g/dL (ref 6.0–8.3)

## 2014-04-27 LAB — CORTISOL: Cortisol, Plasma: 5.4 ug/dL

## 2014-04-27 LAB — TSH: TSH: 4.844 u[IU]/mL — AB (ref 0.350–4.500)

## 2014-04-29 LAB — T3, FREE: T3 FREE: 2.6 pg/mL (ref 2.3–4.2)

## 2014-04-29 LAB — T4, FREE: FREE T4: 0.93 ng/dL (ref 0.80–1.80)

## 2014-05-01 ENCOUNTER — Other Ambulatory Visit: Payer: Self-pay | Admitting: Family Medicine

## 2014-05-02 ENCOUNTER — Ambulatory Visit
Admission: RE | Admit: 2014-05-02 | Discharge: 2014-05-02 | Disposition: A | Discharge status: Home / Self Care | Payer: BLUE CROSS/BLUE SHIELD | Source: Ambulatory Visit | Attending: Family Medicine | Admitting: Family Medicine

## 2014-05-02 ENCOUNTER — Other Ambulatory Visit: Payer: Self-pay | Admitting: Family Medicine

## 2014-05-02 DIAGNOSIS — N644 Mastodynia: Secondary | ICD-10-CM

## 2014-05-09 ENCOUNTER — Ambulatory Visit (INDEPENDENT_AMBULATORY_CARE_PROVIDER_SITE_OTHER): Payer: BLUE CROSS/BLUE SHIELD | Admitting: Family Medicine

## 2014-05-09 ENCOUNTER — Encounter: Payer: Self-pay | Admitting: Family Medicine

## 2014-05-09 ENCOUNTER — Other Ambulatory Visit: Payer: Self-pay | Admitting: *Deleted

## 2014-05-09 VITALS — BP 140/79 | HR 80 | Ht 63.0 in | Wt 234.0 lb

## 2014-05-09 DIAGNOSIS — N644 Mastodynia: Secondary | ICD-10-CM | POA: Diagnosis not present

## 2014-05-09 DIAGNOSIS — M797 Fibromyalgia: Secondary | ICD-10-CM

## 2014-05-09 DIAGNOSIS — E785 Hyperlipidemia, unspecified: Secondary | ICD-10-CM

## 2014-05-09 DIAGNOSIS — E038 Other specified hypothyroidism: Secondary | ICD-10-CM

## 2014-05-09 DIAGNOSIS — E039 Hypothyroidism, unspecified: Secondary | ICD-10-CM

## 2014-05-09 NOTE — Progress Notes (Signed)
   Subjective:    Patient ID: Margaret Beasley, female    DOB: February 14, 1956, 59 y.o.   MRN: [id number]  HPI Left breast pain -  Patient says her breast pain actually resolved about a day before she went for her diagnostic mammogram. She had the study performed on March 17 and it was normal. They did recommend a follow-up bilateral diagnostic mammogram in August, 6 months.  Fatigue - says feels it is worse when it is windy. Says feels abnormally cold.  She does have Flexeril at home to take if needed.  She has been trying to do some yardwork. Her goal is to start walking again,    She also wanted to discuss her lab results for her thyroid today. Her TSH was mild elevated but T4 and T3 were normal. She has felt more tired. She also constantly feels cold. She denies any constipation or significant skin or hair changes. That she think she may be losing a little bit more hair than usual.  Review of Systems     Objective:   Physical Exam  Constitutional: She is oriented to person, place, and time. She appears well-developed and well-nourished.  HENT:  Head: Normocephalic and atraumatic.  Cardiovascular: Normal rate, regular rhythm and normal heart sounds.   Pulmonary/Chest: Effort normal and breath sounds normal.  Neurological: She is alert and oriented to person, place, and time.  Skin: Skin is warm and dry.  Psychiatric: She has a normal mood and affect. Her behavior is normal.          Assessment & Plan:   left breast pain-seems to have resolved and did have normal results on her diagnostic left mammogram.  Fatigue-she is planning on starting some exercise. We discussed the importance of doing graded exercise and explained what that was to her, particularly for fibromyalgia. She does have the Flexeril if needed and can certainly take half a tab if needed.  Subclinical hypothyroidism-discussed the typically we follow this closely as a lot of patients will typically at some point become  overtly hyperthyroid. Also discussed that if at some point her TSH is 10 or higher even though T4 and T3 are normal then we could consider treatment. Encourage her to work on regular exercise and eating healthy. We did discuss weight loss could be very helpful. Next  BMI 41-encouraged her to work on diet exercise and weight loss.

## 2014-05-09 NOTE — Patient Instructions (Signed)
Follow up in 3-4 month for thyroid

## 2014-05-10 DIAGNOSIS — E039 Hypothyroidism, unspecified: Secondary | ICD-10-CM | POA: Insufficient documentation

## 2014-05-10 DIAGNOSIS — E038 Other specified hypothyroidism: Secondary | ICD-10-CM

## 2014-05-10 HISTORY — DX: Other specified hypothyroidism: E03.8

## 2014-05-17 DIAGNOSIS — K5732 Diverticulitis of large intestine without perforation or abscess without bleeding: Secondary | ICD-10-CM

## 2014-05-17 HISTORY — DX: Diverticulitis of large intestine without perforation or abscess without bleeding: K57.32

## 2014-06-28 ENCOUNTER — Ambulatory Visit (INDEPENDENT_AMBULATORY_CARE_PROVIDER_SITE_OTHER): Payer: BLUE CROSS/BLUE SHIELD | Admitting: Family Medicine

## 2014-06-28 ENCOUNTER — Encounter: Payer: Self-pay | Admitting: Family Medicine

## 2014-06-28 VITALS — BP 139/89 | HR 78 | Wt 229.0 lb

## 2014-06-28 DIAGNOSIS — K5732 Diverticulitis of large intestine without perforation or abscess without bleeding: Secondary | ICD-10-CM | POA: Diagnosis not present

## 2014-06-28 MED ORDER — CIPROFLOXACIN HCL 0.3 % OP SOLN
OPHTHALMIC | Status: DC
Start: 2014-06-28 — End: 2014-10-29

## 2014-06-28 NOTE — Progress Notes (Signed)
   Subjective:    Patient ID: Margaret Beasley, female    DOB: [birth year], 59 y.o.   MRN: [id number]  HPI Patient is here today for hospital follow-up. She went to the emergency department with left lower quadrant pain on 05/26/2014. She did have a CT the abdomen that showed acute diverticulitis and a small hiatal hernia. She was discharged home on ciprofloxacin as well as metronidazole. She was also given a small quantity of Zofran and oxycodone for pain. She also was diagnosed with a non-tracked well migraine at that time. White blood cell count was 10.9. She has never had this before. Has lost about 5 lbs.  Has been more active    Review of Systems     Objective:   Physical Exam  Constitutional: She is oriented to person, place, and time. She appears well-developed and well-nourished.  HENT:  Head: Normocephalic and atraumatic.  Cardiovascular: Normal rate, regular rhythm and normal heart sounds.   Pulmonary/Chest: Effort normal and breath sounds normal.  Abdominal: Soft. Bowel sounds are normal. She exhibits no distension and no mass. There is tenderness. There is no rebound and no guarding.  Musculoskeletal: She exhibits no edema.  Neurological: She is alert and oriented to person, place, and time.  Skin: Skin is warm and dry.  Psychiatric: She has a normal mood and affect. Her behavior is normal.          Assessment & Plan:  Diverticulitis of the colon-she's feeling significantly better. Still just a little sore in the left lower abdomen but otherwise doing well. She's eating a regular diet again. In fact she actually says she is eating a little bit more healthy than she was previously has actually lost about 5 pounds which is fantastic. She is trying to be as active as she can with her arthritis. Discussed with her the old recommendations to avoid seeds but newer studies show that it doesn't really make a difference. We will keep an eye out for any new symptoms. I'm going to  recheck her CBC and BMP. White blood cell count was 10.9 during hospitalization.   She has not gone for the blood work that we have ordered. She said she will try to go next week. We'll need to see her back in 3-4 months for her fibromyalgia. She is following with her rheumatologist regularly.

## 2014-06-29 LAB — BASIC METABOLIC PANEL
BUN: 11 mg/dL (ref 6–23)
CALCIUM: 9.2 mg/dL (ref 8.4–10.5)
CO2: 29 meq/L (ref 19–32)
Chloride: 102 mEq/L (ref 96–112)
Creat: 0.98 mg/dL (ref 0.50–1.10)
Glucose, Bld: 106 mg/dL — ABNORMAL HIGH (ref 70–99)
POTASSIUM: 4.2 meq/L (ref 3.5–5.3)
SODIUM: 139 meq/L (ref 135–145)

## 2014-07-04 ENCOUNTER — Other Ambulatory Visit: Payer: Self-pay | Admitting: *Deleted

## 2014-07-04 DIAGNOSIS — D696 Thrombocytopenia, unspecified: Secondary | ICD-10-CM

## 2014-07-04 LAB — CBC WITH DIFFERENTIAL/PLATELET
Basophils Absolute: 0 10*3/uL (ref 0.0–0.1)
Basophils Relative: 0 % (ref 0–1)
EOS ABS: 0.1 10*3/uL (ref 0.0–0.7)
Eosinophils Relative: 1 % (ref 0–5)
HEMATOCRIT: 42 % (ref 36.0–46.0)
HEMOGLOBIN: 14 g/dL (ref 12.0–15.0)
Lymphocytes Relative: 28 % (ref 12–46)
Lymphs Abs: 1.6 10*3/uL (ref 0.7–4.0)
MCH: 30.5 pg (ref 26.0–34.0)
MCHC: 33.3 g/dL (ref 30.0–36.0)
MCV: 91.5 fL (ref 78.0–100.0)
MONO ABS: 0.6 10*3/uL (ref 0.1–1.0)
MPV: 12.8 fL — AB (ref 8.6–12.4)
Monocytes Relative: 10 % (ref 3–12)
NEUTROS ABS: 3.5 10*3/uL (ref 1.7–7.7)
NEUTROS PCT: 61 % (ref 43–77)
PLATELETS: 79 10*3/uL — AB (ref 150–400)
RBC: 4.59 MIL/uL (ref 3.87–5.11)
RDW: 14.3 % (ref 11.5–15.5)
WBC: 5.8 10*3/uL (ref 4.0–10.5)

## 2014-07-05 LAB — LIPID PANEL
Cholesterol: 139 mg/dL (ref 0–200)
HDL: 58 mg/dL (ref >=46)
LDL CALC: 61 mg/dL (ref 0–99)
TRIGLYCERIDES: 102 mg/dL (ref <150)
Total CHOL/HDL Ratio: 2.4 Ratio
VLDL: 20 mg/dL (ref 0–40)

## 2014-10-15 DIAGNOSIS — C44612 Basal cell carcinoma of skin of right upper limb, including shoulder: Secondary | ICD-10-CM | POA: Diagnosis not present

## 2014-10-29 ENCOUNTER — Ambulatory Visit (INDEPENDENT_AMBULATORY_CARE_PROVIDER_SITE_OTHER): Payer: BLUE CROSS/BLUE SHIELD | Admitting: Family Medicine

## 2014-10-29 ENCOUNTER — Encounter: Payer: Self-pay | Admitting: Family Medicine

## 2014-10-29 VITALS — BP 128/74 | HR 77 | Ht 63.0 in | Wt 217.0 lb

## 2014-10-29 DIAGNOSIS — E669 Obesity, unspecified: Secondary | ICD-10-CM

## 2014-10-29 DIAGNOSIS — M189 Osteoarthritis of first carpometacarpal joint, unspecified: Secondary | ICD-10-CM | POA: Insufficient documentation

## 2014-10-29 DIAGNOSIS — M5412 Radiculopathy, cervical region: Secondary | ICD-10-CM | POA: Insufficient documentation

## 2014-10-29 DIAGNOSIS — Z114 Encounter for screening for human immunodeficiency virus [HIV]: Secondary | ICD-10-CM | POA: Diagnosis not present

## 2014-10-29 DIAGNOSIS — Z6838 Body mass index (BMI) 38.0-38.9, adult: Secondary | ICD-10-CM

## 2014-10-29 DIAGNOSIS — Z13220 Encounter for screening for lipoid disorders: Secondary | ICD-10-CM

## 2014-10-29 DIAGNOSIS — E039 Hypothyroidism, unspecified: Secondary | ICD-10-CM

## 2014-10-29 DIAGNOSIS — M797 Fibromyalgia: Secondary | ICD-10-CM

## 2014-10-29 DIAGNOSIS — E038 Other specified hypothyroidism: Secondary | ICD-10-CM

## 2014-10-29 DIAGNOSIS — M1811 Unilateral primary osteoarthritis of first carpometacarpal joint, right hand: Secondary | ICD-10-CM

## 2014-10-29 DIAGNOSIS — M2550 Pain in unspecified joint: Secondary | ICD-10-CM

## 2014-10-29 HISTORY — DX: Radiculopathy, cervical region: M54.12

## 2014-10-29 MED ORDER — PREDNISONE 5 MG (48) PO TBPK: ORAL_TABLET | ORAL | Status: DC

## 2014-10-29 MED ORDER — HYDROCODONE-ACETAMINOPHEN 5-325 MG PO TABS: 1.0000 | ORAL_TABLET | Freq: Four times a day (QID) | ORAL | Status: DC | PRN

## 2014-10-29 MED ORDER — CYCLOBENZAPRINE HCL 5 MG PO TABS: 5.0000 mg | ORAL_TABLET | Freq: Every day | ORAL | Status: DC

## 2014-10-29 MED ORDER — SIMVASTATIN 40 MG PO TABS: 40.0000 mg | ORAL_TABLET | Freq: Every day | ORAL | Status: DC

## 2014-10-29 MED ORDER — DULOXETINE HCL 30 MG PO CPEP: 90.0000 mg | ORAL_CAPSULE | Freq: Every day | ORAL | Status: DC

## 2014-10-29 NOTE — Assessment & Plan Note (Signed)
Symptoms likely due to cervical radiculopathy. Patient did not have improvement and hand pain following diagnostic and therapeutic first Rome injection. She has a nerve conduction study from 2014 showing radiculopathy of the right C6 and C7 nerve urgency. Plan for prednisone Dosepak and Norco. Return in about a week.

## 2014-10-29 NOTE — Progress Notes (Signed)
   Subjective:    Patient ID: Margaret Beasley, female    DOB: 04-17-1955, 59 y.o.   MRN: [id number]  HPI F/u Fibromyalgia - pt reports that her rheumatologist office will be closing and she will need a referral and will need refills on cymbalta. Needs a refill on the flexeril.    Obese/ BMI 38 she has lost 12 pounds and has done fantastic and has been able to keep it off.  Review of Systems     Objective:   Physical Exam  Constitutional: She is oriented to person, place, and time. She appears well-developed and well-nourished.  HENT:  Head: Normocephalic and atraumatic.  Cardiovascular: Normal rate, regular rhythm and normal heart sounds.   Pulmonary/Chest: Effort normal and breath sounds normal.  Neurological: She is alert and oriented to person, place, and time.  Skin: Skin is warm and dry.  Psychiatric: She has a normal mood and affect. Her behavior is normal.          Assessment & Plan:  Fibromyalgia -  We'll continue with Cymbalta since this is been very helpful for her in the long-term. I did go ahead and refill her muscle relaxer which also is very helpful for her as well as. Work on trying to get her in with a new rheumatologist. That the medications were sent to her mail order in the interim.  Osteoarthritis of right hand-she was actually scheduled to have another injection with her rheumatologist says she is actually seeing Dr. Georgina Snell for that later today.  Obesity/BMI 38 - improved, down from 40 w/ 12 lbs weight loss.  Encouraged to continue to work on diet and exercise.   Subclinical hypothyroid  -  Due to recheck thyroid levels.

## 2014-10-29 NOTE — Progress Notes (Signed)
   Subjective:    I'm seeing this patient as a consultation for:  Dr. Madilyn Fireman  CC: Right hand pain.  HPI:  Patient has a 3 week history of significant right arm pain. She has pain rating from her neck to her right thumb. Majority of her pain is located at the base of her right thumb. Pain is worse with motion. No weakness or numbness. No fevers chills nausea vomiting or diarrhea. She occasionally gets hand injections by her rheumatologist. However the rheumatologist abruptly left the group. She has not tried any oral medications yet. She notes she is leaving the state for a week starting tomorrow.   Past medical history, Surgical history, Family history not pertinant except as noted below, Social history, Allergies, and medications have been entered into the medical record, reviewed, and no changes needed.   Review of Systems: No headache, visual changes, nausea, vomiting, diarrhea, constipation, dizziness, abdominal pain, skin rash, fevers, chills, night sweats, weight loss, swollen lymph nodes, body aches, joint swelling, muscle aches, chest pain, shortness of breath, mood changes, visual or auditory hallucinations.   Objective:    Filed Vitals:   10/29/14 1315  BP: 128/74  Pulse: 77   General: Well Developed, well nourished, and in no acute distress.  Neuro/Psych: Alert and oriented x3, extra-ocular muscles intact, able to move all 4 extremities, sensation grossly intact. Skin: Warm and dry, no rashes noted.  Respiratory: Not using accessory muscles, speaking in full sentences, trachea midline.  Cardiovascular: Pulses palpable, no extremity edema. Abdomen: Does not appear distended. MSK: Neck: Nontender to midline normal neck range of motion mildly positive right Spurling's test. Right arm tender palpation right first [facility]. Pain with motion. Nontender radial styloid or anatomical snuff box. Thumb motion is intact. Pulses capillary refill and sensation are intact distally.  Patient  comes to clinic today with a nerve conduction study performed in 2014 showing chronic right C6 and C7 radiculopathies without any evidence of median or ulnar neuropathy.  Procedure: Real-time Ultrasound Guided Injection of right first [facility]  Diagnostic and therapeutic injection Device: GE Logiq E  Images permanently stored and available for review in the ultrasound unit. Verbal informed consent obtained. Discussed risks and benefits of procedure. Warned about infection bleeding damage to structures skin hypopigmentation and fat atrophy among others. Patient expresses understanding and agreement Time-out conducted.  Noted no overlying erythema, induration, or other signs of local infection.  Skin prepped in a sterile fashion.  Local anesthesia: Topical Ethyl chloride.  With sterile technique and under real time ultrasound guidance: 20 mL of Kenalog and 0.5 mL of Marcaine injected easily.  Completed without difficulty  Pain did not significantly improve Advised to call if fevers/chills, erythema, induration, drainage, or persistent bleeding.  Images permanently stored and available for review in the ultrasound unit.  Impression: Technically successful ultrasound guided injection.    No results found for this or any previous visit (from the past 24 hour(s)). No results found.  Impression and Recommendations:   This case required medical decision making of moderate complexity.

## 2014-10-29 NOTE — Assessment & Plan Note (Signed)
Patient has some degenerative change of her right first [facility] however I don't think this is the primary driver of her pain as she did not improve with diagnostic and therapeutic interarticular injection.

## 2014-10-29 NOTE — Patient Instructions (Addendum)
Thank you for coming in today. Return with Dr. Madilyn Fireman as needed.  Take prednisone for 12 days.  Use norco for severe pain.  Follow up with me when you return from [place].   Call or go to the ER if you develop a large red swollen joint with extreme pain or oozing puss.   Cervical Radiculopathy Cervical radiculopathy happens when a nerve in the neck is pinched or bruised by a slipped (herniated) disk or by arthritic changes in the bones of the cervical spine. This can occur due to an injury or as part of the normal aging process. Pressure on the cervical nerves can cause pain or numbness that runs from your neck all the way down into your arm and fingers. CAUSES  There are many possible causes, including:  Injury.  Muscle tightness in the neck from overuse.  Swollen, painful joints (arthritis).  Breakdown or degeneration in the bones and joints of the spine (spondylosis) due to aging.  Bone spurs that may develop near the cervical nerves. SYMPTOMS  Symptoms include pain, weakness, or numbness in the affected arm and hand. Pain can be severe or irritating. Symptoms may be worse when extending or turning the neck. DIAGNOSIS  Your caregiver will ask about your symptoms and do a physical exam. He or she may test your strength and reflexes. X-rays, CT scans, and MRI scans may be needed in cases of injury or if the symptoms do not go away after a period of time. Electromyography (EMG) or nerve conduction testing may be done to study how your nerves and muscles are working. TREATMENT  Your caregiver may recommend certain exercises to help relieve your symptoms. Cervical radiculopathy can, and often does, get better with time and treatment. If your problems continue, treatment options may include:  Wearing a soft collar for short periods of time.  Physical therapy to strengthen the neck muscles.  Medicines, such as nonsteroidal anti-inflammatory drugs (NSAIDs), oral corticosteroids, or  spinal injections.  Surgery. Different types of surgery may be done depending on the cause of your problems. HOME CARE INSTRUCTIONS   Put ice on the affected area.  Put ice in a plastic bag.  Place a towel between your skin and the bag.  Leave the ice on for 15-20 minutes, 03-04 times a day or as directed by your caregiver.  If ice does not help, you can try using heat. Take a warm shower or bath, or use a hot water bottle as directed by your caregiver.  You may try a gentle neck and shoulder massage.  Use a flat pillow when you sleep.  Only take over-the-counter or prescription medicines for pain, discomfort, or fever as directed by your caregiver.  If physical therapy was prescribed, follow your caregiver's directions.  If a soft collar was prescribed, use it as directed. SEEK IMMEDIATE MEDICAL CARE IF:   Your pain gets much worse and cannot be controlled with medicines.  You have weakness or numbness in your hand, arm, face, or leg.  You have a high fever or a stiff, rigid neck.  You lose bowel or bladder control (incontinence).  You have trouble with walking, balance, or speaking. MAKE SURE YOU:   Understand these instructions.  Will watch your condition.  Will get help right away if you are not doing well or get worse. Document Released: 10/27/2000 Document Revised: 04/26/2011 Document Reviewed: 09/15/2010 [facility] Patient Information 2015 [place], [place]. This information is not intended to replace advice given to you by your  health care provider. Make sure you discuss any questions you have with your health care provider.

## 2014-11-18 ENCOUNTER — Telehealth: Payer: Self-pay | Admitting: *Deleted

## 2014-11-18 DIAGNOSIS — M797 Fibromyalgia: Secondary | ICD-10-CM

## 2014-11-18 NOTE — Telephone Encounter (Signed)
Pt called and stated that when she was seen by Dr. Georgia Lopes he informed her that he doesn't treat pt w/Fibromyalgia and that her former rheumatologist dr. Dossie Der is now @ [place] and she can see her there unless Dr. Madilyn Fireman has someone else she would like to send her to for this. Either way she will need a new referral. Will fwd to pcp for advice.Audelia Hives [place]

## 2014-11-18 NOTE — Telephone Encounter (Signed)
[place], that is OK.  We can place a new referral to [place] for D.r Dossie Der.

## 2014-11-19 NOTE — Telephone Encounter (Signed)
Pt informed that referral will be sent.Audelia Hives [place]

## 2015-03-10 DIAGNOSIS — F411 Generalized anxiety disorder: Secondary | ICD-10-CM | POA: Diagnosis not present

## 2015-03-10 DIAGNOSIS — R4189 Other symptoms and signs involving cognitive functions and awareness: Secondary | ICD-10-CM | POA: Diagnosis not present

## 2015-03-10 DIAGNOSIS — G40209 Localization-related (focal) (partial) symptomatic epilepsy and epileptic syndromes with complex partial seizures, not intractable, without status epilepticus: Secondary | ICD-10-CM | POA: Diagnosis not present

## 2015-03-10 DIAGNOSIS — G250 Essential tremor: Secondary | ICD-10-CM | POA: Diagnosis not present

## 2015-03-10 DIAGNOSIS — Z6839 Body mass index (BMI) 39.0-39.9, adult: Secondary | ICD-10-CM | POA: Diagnosis not present

## 2015-03-10 DIAGNOSIS — G40309 Generalized idiopathic epilepsy and epileptic syndromes, not intractable, without status epilepticus: Secondary | ICD-10-CM | POA: Diagnosis not present

## 2015-03-11 DIAGNOSIS — G40209 Localization-related (focal) (partial) symptomatic epilepsy and epileptic syndromes with complex partial seizures, not intractable, without status epilepticus: Secondary | ICD-10-CM | POA: Diagnosis not present

## 2015-03-11 DIAGNOSIS — G40309 Generalized idiopathic epilepsy and epileptic syndromes, not intractable, without status epilepticus: Secondary | ICD-10-CM | POA: Diagnosis not present

## 2015-03-18 DIAGNOSIS — R569 Unspecified convulsions: Secondary | ICD-10-CM | POA: Diagnosis not present

## 2015-03-24 DIAGNOSIS — M1990 Unspecified osteoarthritis, unspecified site: Secondary | ICD-10-CM | POA: Diagnosis not present

## 2015-03-24 DIAGNOSIS — M25519 Pain in unspecified shoulder: Secondary | ICD-10-CM | POA: Diagnosis not present

## 2015-03-24 DIAGNOSIS — M797 Fibromyalgia: Secondary | ICD-10-CM | POA: Diagnosis not present

## 2015-03-24 DIAGNOSIS — M7551 Bursitis of right shoulder: Secondary | ICD-10-CM | POA: Diagnosis not present

## 2015-04-08 DIAGNOSIS — Z87898 Personal history of other specified conditions: Secondary | ICD-10-CM | POA: Diagnosis not present

## 2015-04-08 DIAGNOSIS — M791 Myalgia: Secondary | ICD-10-CM | POA: Diagnosis not present

## 2015-04-08 DIAGNOSIS — G40309 Generalized idiopathic epilepsy and epileptic syndromes, not intractable, without status epilepticus: Secondary | ICD-10-CM | POA: Diagnosis not present

## 2015-04-08 DIAGNOSIS — R51 Headache: Secondary | ICD-10-CM | POA: Diagnosis not present

## 2015-04-08 DIAGNOSIS — M542 Cervicalgia: Secondary | ICD-10-CM | POA: Diagnosis not present

## 2015-04-08 DIAGNOSIS — R410 Disorientation, unspecified: Secondary | ICD-10-CM | POA: Diagnosis not present

## 2015-04-08 DIAGNOSIS — G40409 Other generalized epilepsy and epileptic syndromes, not intractable, without status epilepticus: Secondary | ICD-10-CM | POA: Diagnosis not present

## 2015-04-14 DIAGNOSIS — G253 Myoclonus: Secondary | ICD-10-CM | POA: Diagnosis not present

## 2015-04-14 DIAGNOSIS — G40309 Generalized idiopathic epilepsy and epileptic syndromes, not intractable, without status epilepticus: Secondary | ICD-10-CM | POA: Diagnosis not present

## 2015-04-14 DIAGNOSIS — Z6837 Body mass index (BMI) 37.0-37.9, adult: Secondary | ICD-10-CM | POA: Diagnosis not present

## 2015-04-14 DIAGNOSIS — G40209 Localization-related (focal) (partial) symptomatic epilepsy and epileptic syndromes with complex partial seizures, not intractable, without status epilepticus: Secondary | ICD-10-CM | POA: Diagnosis not present

## 2015-04-14 DIAGNOSIS — R4189 Other symptoms and signs involving cognitive functions and awareness: Secondary | ICD-10-CM | POA: Diagnosis not present

## 2015-04-14 DIAGNOSIS — F411 Generalized anxiety disorder: Secondary | ICD-10-CM | POA: Diagnosis not present

## 2015-05-02 DIAGNOSIS — H539 Unspecified visual disturbance: Secondary | ICD-10-CM | POA: Diagnosis not present

## 2015-05-02 DIAGNOSIS — M545 Low back pain: Secondary | ICD-10-CM | POA: Diagnosis not present

## 2015-05-02 DIAGNOSIS — I10 Essential (primary) hypertension: Secondary | ICD-10-CM | POA: Diagnosis not present

## 2015-05-02 DIAGNOSIS — S3992XA Unspecified injury of lower back, initial encounter: Secondary | ICD-10-CM | POA: Diagnosis not present

## 2015-05-02 DIAGNOSIS — S39012A Strain of muscle, fascia and tendon of lower back, initial encounter: Secondary | ICD-10-CM | POA: Diagnosis not present

## 2015-05-02 DIAGNOSIS — Z79899 Other long term (current) drug therapy: Secondary | ICD-10-CM | POA: Diagnosis not present

## 2015-06-05 DIAGNOSIS — R413 Other amnesia: Secondary | ICD-10-CM | POA: Diagnosis not present

## 2015-06-05 DIAGNOSIS — R4189 Other symptoms and signs involving cognitive functions and awareness: Secondary | ICD-10-CM | POA: Diagnosis not present

## 2015-06-05 DIAGNOSIS — G40209 Localization-related (focal) (partial) symptomatic epilepsy and epileptic syndromes with complex partial seizures, not intractable, without status epilepticus: Secondary | ICD-10-CM | POA: Diagnosis not present

## 2015-06-05 DIAGNOSIS — Z6839 Body mass index (BMI) 39.0-39.9, adult: Secondary | ICD-10-CM | POA: Diagnosis not present

## 2015-06-05 DIAGNOSIS — G40309 Generalized idiopathic epilepsy and epileptic syndromes, not intractable, without status epilepticus: Secondary | ICD-10-CM | POA: Diagnosis not present

## 2015-06-05 DIAGNOSIS — Z9181 History of falling: Secondary | ICD-10-CM | POA: Insufficient documentation

## 2015-06-05 DIAGNOSIS — G253 Myoclonus: Secondary | ICD-10-CM | POA: Diagnosis not present

## 2015-06-18 DIAGNOSIS — M25519 Pain in unspecified shoulder: Secondary | ICD-10-CM | POA: Diagnosis not present

## 2015-06-18 DIAGNOSIS — M189 Osteoarthritis of first carpometacarpal joint, unspecified: Secondary | ICD-10-CM | POA: Diagnosis not present

## 2015-06-18 DIAGNOSIS — M7551 Bursitis of right shoulder: Secondary | ICD-10-CM | POA: Diagnosis not present

## 2015-06-18 DIAGNOSIS — M1990 Unspecified osteoarthritis, unspecified site: Secondary | ICD-10-CM | POA: Diagnosis not present

## 2015-06-18 DIAGNOSIS — M797 Fibromyalgia: Secondary | ICD-10-CM | POA: Diagnosis not present

## 2015-06-19 ENCOUNTER — Encounter: Payer: Self-pay | Admitting: Family Medicine

## 2015-06-19 ENCOUNTER — Ambulatory Visit (INDEPENDENT_AMBULATORY_CARE_PROVIDER_SITE_OTHER): Payer: BLUE CROSS/BLUE SHIELD

## 2015-06-19 ENCOUNTER — Ambulatory Visit (INDEPENDENT_AMBULATORY_CARE_PROVIDER_SITE_OTHER): Payer: BLUE CROSS/BLUE SHIELD | Admitting: Family Medicine

## 2015-06-19 VITALS — BP 126/78 | HR 84 | Ht 63.0 in | Wt 214.0 lb

## 2015-06-19 DIAGNOSIS — R928 Other abnormal and inconclusive findings on diagnostic imaging of breast: Secondary | ICD-10-CM | POA: Diagnosis not present

## 2015-06-19 DIAGNOSIS — R7989 Other specified abnormal findings of blood chemistry: Secondary | ICD-10-CM | POA: Diagnosis not present

## 2015-06-19 DIAGNOSIS — N1832 Chronic kidney disease, stage 3b: Secondary | ICD-10-CM

## 2015-06-19 DIAGNOSIS — R5383 Other fatigue: Secondary | ICD-10-CM | POA: Diagnosis not present

## 2015-06-19 DIAGNOSIS — E049 Nontoxic goiter, unspecified: Secondary | ICD-10-CM

## 2015-06-19 DIAGNOSIS — N183 Chronic kidney disease, stage 3 (moderate): Secondary | ICD-10-CM | POA: Diagnosis not present

## 2015-06-19 DIAGNOSIS — M797 Fibromyalgia: Secondary | ICD-10-CM

## 2015-06-19 DIAGNOSIS — L989 Disorder of the skin and subcutaneous tissue, unspecified: Secondary | ICD-10-CM

## 2015-06-19 DIAGNOSIS — E010 Iodine-deficiency related diffuse (endemic) goiter: Secondary | ICD-10-CM

## 2015-06-19 DIAGNOSIS — R799 Abnormal finding of blood chemistry, unspecified: Secondary | ICD-10-CM | POA: Diagnosis not present

## 2015-06-19 LAB — COMPLETE METABOLIC PANEL WITH GFR
ALT: 14 U/L (ref 6–29)
AST: 20 U/L (ref 10–35)
Albumin: 4.4 g/dL (ref 3.6–5.1)
Alkaline Phosphatase: 62 U/L (ref 33–130)
BUN: 15 mg/dL (ref 7–25)
CALCIUM: 9.5 mg/dL (ref 8.6–10.4)
CO2: 29 mmol/L (ref 20–31)
CREATININE: 0.88 mg/dL (ref 0.50–1.05)
Chloride: 100 mmol/L (ref 98–110)
GFR, Est African American: 83 mL/min (ref >=60)
GFR, Est Non African American: 72 mL/min (ref >=60)
Glucose, Bld: 95 mg/dL (ref 65–99)
Potassium: 4.3 mmol/L (ref 3.5–5.3)
Sodium: 139 mmol/L (ref 135–146)
Total Bilirubin: 0.5 mg/dL (ref 0.2–1.2)
Total Protein: 7.2 g/dL (ref 6.1–8.1)

## 2015-06-19 LAB — CBC
HEMATOCRIT: 44.2 % (ref 35.0–45.0)
Hemoglobin: 14.7 g/dL (ref 11.7–15.5)
MCH: 31.1 pg (ref 27.0–33.0)
MCHC: 33.3 g/dL (ref 32.0–36.0)
MCV: 93.4 fL (ref 80.0–100.0)
MPV: 10.8 fL (ref 7.5–12.5)
PLATELETS: 246 10*3/uL (ref 140–400)
RBC: 4.73 MIL/uL (ref 3.80–5.10)
RDW: 13.5 % (ref 11.0–15.0)
WBC: 5.7 10*3/uL (ref 3.8–10.8)

## 2015-06-19 LAB — LIPID PANEL
Cholesterol: 190 mg/dL (ref 125–200)
HDL: 63 mg/dL (ref >=46)
LDL Cholesterol: 99 mg/dL (ref <130)
Total CHOL/HDL Ratio: 3 Ratio (ref <=5.0)
Triglycerides: 142 mg/dL (ref <150)
VLDL: 28 mg/dL (ref <30)

## 2015-06-19 LAB — TSH: TSH: 2.54 mIU/L

## 2015-06-19 LAB — RHEUMATOID FACTOR: Rhuematoid fact SerPl-aCnc: <10 IU/mL (ref <=14)

## 2015-06-19 LAB — T4, FREE: Free T4: 1 ng/dL (ref 0.8–1.8)

## 2015-06-19 LAB — FOLATE: Folate: 20.4 ng/mL (ref >5.4)

## 2015-06-19 MED ORDER — DULOXETINE HCL 30 MG PO CPEP: 90.0000 mg | ORAL_CAPSULE | Freq: Every day | ORAL | Status: DC
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
## 2015-06-20 LAB — VITAMIN D 25 HYDROXY (VIT D DEFICIENCY, FRACTURES): VIT D 25 HYDROXY: 31 ng/mL (ref 30–100)

## 2015-06-20 LAB — ANA: Anti Nuclear Antibody(ANA): NEGATIVE

## 2015-06-20 LAB — RPR

## 2015-06-20 LAB — SEDIMENTATION RATE: SED RATE: 11 mm/h (ref 0–30)

## 2015-06-24 ENCOUNTER — Telehealth: Payer: Self-pay | Admitting: *Deleted

## 2015-06-24 NOTE — Telephone Encounter (Signed)
Form completed, faxed, confirmation received,and scanned.Margaret Beasley [place]

## 2015-07-16 ENCOUNTER — Telehealth: Payer: Self-pay | Admitting: *Deleted

## 2015-07-16 NOTE — Telephone Encounter (Signed)
Called pt and informed her that she will need to schedule an appt to get abx.Margaret Beasley \

## 2015-07-17 ENCOUNTER — Ambulatory Visit: Payer: Medicare Other | Admitting: Family Medicine

## 2015-08-05 DIAGNOSIS — M1990 Unspecified osteoarthritis, unspecified site: Secondary | ICD-10-CM | POA: Diagnosis not present

## 2015-08-14 ENCOUNTER — Other Ambulatory Visit: Payer: Self-pay

## 2015-08-14 DIAGNOSIS — R928 Other abnormal and inconclusive findings on diagnostic imaging of breast: Secondary | ICD-10-CM

## 2015-08-14 DIAGNOSIS — R9389 Abnormal findings on diagnostic imaging of other specified body structures: Secondary | ICD-10-CM

## 2015-09-03 ENCOUNTER — Ambulatory Visit (INDEPENDENT_AMBULATORY_CARE_PROVIDER_SITE_OTHER): Payer: BLUE CROSS/BLUE SHIELD | Admitting: Physician Assistant

## 2015-09-03 ENCOUNTER — Encounter: Payer: Self-pay | Admitting: Physician Assistant

## 2015-09-03 ENCOUNTER — Other Ambulatory Visit: Payer: Self-pay | Admitting: *Deleted

## 2015-09-03 VITALS — BP 129/74 | HR 80 | Ht 63.0 in | Wt 225.0 lb

## 2015-09-03 DIAGNOSIS — R110 Nausea: Secondary | ICD-10-CM | POA: Insufficient documentation

## 2015-09-03 DIAGNOSIS — E049 Nontoxic goiter, unspecified: Secondary | ICD-10-CM | POA: Insufficient documentation

## 2015-09-03 DIAGNOSIS — N898 Other specified noninflammatory disorders of vagina: Secondary | ICD-10-CM

## 2015-09-03 DIAGNOSIS — L298 Other pruritus: Secondary | ICD-10-CM | POA: Diagnosis not present

## 2015-09-03 DIAGNOSIS — E038 Other specified hypothyroidism: Secondary | ICD-10-CM

## 2015-09-03 DIAGNOSIS — R300 Dysuria: Secondary | ICD-10-CM | POA: Diagnosis not present

## 2015-09-03 DIAGNOSIS — E039 Hypothyroidism, unspecified: Secondary | ICD-10-CM

## 2015-09-03 LAB — POCT URINALYSIS DIPSTICK
Blood, UA: NEGATIVE
GLUCOSE UA: NEGATIVE
Ketones, UA: NEGATIVE
Leukocytes, UA: NEGATIVE
Nitrite, UA: NEGATIVE
Protein, UA: NEGATIVE
Spec Grav, UA: 1.025
UROBILINOGEN UA: 0.2
pH, UA: 5.5

## 2015-09-03 LAB — WET PREP FOR TRICH, YEAST, CLUE
Clue Cells Wet Prep HPF POC: NONE SEEN
Trich, Wet Prep: NONE SEEN
Yeast Wet Prep HPF POC: NONE SEEN

## 2015-09-03 MED ORDER — METRONIDAZOLE 500 MG PO TABS: 500.0000 mg | ORAL_TABLET | Freq: Two times a day (BID) | ORAL | Status: DC

## 2015-09-03 NOTE — Progress Notes (Addendum)
   Subjective:    Patient ID: Margaret Beasley, female    DOB: Apr 30, 1955, 60 y.o.   MRN: [id number]  HPI Pt presents to clinic with a CC of malodorous, yellow/green discharge that started one week ago. She states that in the last 2-3 days she's experiencing burning with urination and increase in urinary frequency. She reports not being sexual active. She does report spending 4-5 hours working in the yard each week. She denies itchiness, fever, chills, back pain, urgency, blood in urine, new soaps/detergents, and recent antibiotic use.   She also complains of on and off nausea for the last month. She states it comes and goes and only last for a minute or so.  She expresses concern over her mildly enlarged thyroid.   Review of Systems  Constitutional: Negative for fever.  Eyes: Negative.   Respiratory: Negative.   Cardiovascular: Negative.   Gastrointestinal: Negative.   Endocrine: Negative.   Genitourinary: Positive for dysuria and vaginal discharge.  Musculoskeletal: Negative.        Objective:   Physical Exam  Constitutional: She is oriented to person, place, and time. She appears well-developed and well-nourished.  Neck: Normal range of motion. Neck supple. Thyromegaly present.  Mildly enlarged.   Abdominal: Soft. Bowel sounds are normal. There is no tenderness.  Lymphadenopathy:    She has no cervical adenopathy.  Neurological: She is alert and oriented to person, place, and time.  Skin: Skin is warm and dry.       Assessment & Plan:   Dysuria/vaginal discharge/- UA and wet mount done today. Urine showed no infection etiology. Will culture. Wet mount was negative for yeast, trich, clue cells. Due to symptoms and self collection will go ahead and treat. If symptoms not improving follow up.   Thyromegaly- TSH in May was within normal limits. [place] of thyroid done in May and showed a mildly enlarged thyroid with no nodules. Repeat TSH and free T4 in 6 months. Follow up with  PCP.  Nausea- unclear etiology. Could be linked to post nasal drip causing some nausea. Consider zyrtec for a few days.

## 2015-09-05 DIAGNOSIS — G40309 Generalized idiopathic epilepsy and epileptic syndromes, not intractable, without status epilepticus: Secondary | ICD-10-CM | POA: Diagnosis not present

## 2015-09-05 DIAGNOSIS — Z6839 Body mass index (BMI) 39.0-39.9, adult: Secondary | ICD-10-CM | POA: Diagnosis not present

## 2015-09-05 DIAGNOSIS — G253 Myoclonus: Secondary | ICD-10-CM | POA: Diagnosis not present

## 2015-09-05 DIAGNOSIS — R4189 Other symptoms and signs involving cognitive functions and awareness: Secondary | ICD-10-CM | POA: Diagnosis not present

## 2015-09-05 DIAGNOSIS — G40209 Localization-related (focal) (partial) symptomatic epilepsy and epileptic syndromes with complex partial seizures, not intractable, without status epilepticus: Secondary | ICD-10-CM | POA: Diagnosis not present

## 2015-09-05 LAB — URINE CULTURE: Organism ID, Bacteria: 10000

## 2015-10-08 ENCOUNTER — Ambulatory Visit
Admission: RE | Admit: 2015-10-08 | Discharge: 2015-10-08 | Disposition: A | Discharge status: Home / Self Care | Payer: BLUE CROSS/BLUE SHIELD | Source: Ambulatory Visit | Attending: Family Medicine | Admitting: Family Medicine

## 2015-10-08 DIAGNOSIS — R928 Other abnormal and inconclusive findings on diagnostic imaging of breast: Secondary | ICD-10-CM | POA: Diagnosis not present

## 2015-10-08 DIAGNOSIS — R9389 Abnormal findings on diagnostic imaging of other specified body structures: Secondary | ICD-10-CM

## 2015-10-16 ENCOUNTER — Encounter: Payer: Self-pay | Admitting: Family Medicine

## 2015-10-16 ENCOUNTER — Ambulatory Visit (INDEPENDENT_AMBULATORY_CARE_PROVIDER_SITE_OTHER): Payer: BLUE CROSS/BLUE SHIELD | Admitting: Family Medicine

## 2015-10-16 VITALS — BP 123/75 | HR 89 | Ht 63.0 in | Wt 227.0 lb

## 2015-10-16 DIAGNOSIS — E049 Nontoxic goiter, unspecified: Secondary | ICD-10-CM | POA: Diagnosis not present

## 2015-10-16 DIAGNOSIS — E039 Hypothyroidism, unspecified: Secondary | ICD-10-CM

## 2015-10-16 DIAGNOSIS — E038 Other specified hypothyroidism: Secondary | ICD-10-CM | POA: Diagnosis not present

## 2015-10-16 DIAGNOSIS — R1310 Dysphagia, unspecified: Secondary | ICD-10-CM | POA: Diagnosis not present

## 2015-10-16 DIAGNOSIS — E010 Iodine-deficiency related diffuse (endemic) goiter: Secondary | ICD-10-CM

## 2015-10-16 DIAGNOSIS — M2550 Pain in unspecified joint: Secondary | ICD-10-CM

## 2015-10-16 LAB — CBC WITH DIFFERENTIAL/PLATELET
BASOS ABS: 0 {cells}/uL (ref 0–200)
Basophils Relative: 0 %
Eosinophils Absolute: 94 cells/uL (ref 15–500)
Eosinophils Relative: 2 %
HEMATOCRIT: 42.9 % (ref 35.0–45.0)
HEMOGLOBIN: 14.1 g/dL (ref 11.7–15.5)
Lymphocytes Relative: 34 %
Lymphs Abs: 1598 cells/uL (ref 850–3900)
MCH: 30.9 pg (ref 27.0–33.0)
MCHC: 32.9 g/dL (ref 32.0–36.0)
MCV: 94.1 fL (ref 80.0–100.0)
MONO ABS: 517 {cells}/uL (ref 200–950)
MPV: 10.6 fL (ref 7.5–12.5)
Monocytes Relative: 11 %
NEUTROS PCT: 53 %
Neutro Abs: 2491 cells/uL (ref 1500–7800)
Platelets: 237 10*3/uL (ref 140–400)
RBC: 4.56 MIL/uL (ref 3.80–5.10)
RDW: 13.9 % (ref 11.0–15.0)
WBC: 4.7 10*3/uL (ref 3.8–10.8)

## 2015-10-16 MED ORDER — OMEPRAZOLE 40 MG PO CPDR: 40.0000 mg | DELAYED_RELEASE_CAPSULE | Freq: Every day | ORAL | 1 refills | Status: DC
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
## 2015-10-17 DIAGNOSIS — R1310 Dysphagia, unspecified: Secondary | ICD-10-CM

## 2015-10-17 HISTORY — DX: Dysphagia, unspecified: R13.10

## 2015-10-17 LAB — RHEUMATOID FACTOR: Rhuematoid fact SerPl-aCnc: <10 IU/mL (ref <=14)

## 2015-10-17 LAB — TSH: TSH: 4.5 m[IU]/L

## 2015-10-17 LAB — THYROID ANTIBODIES
Thyroglobulin Ab: <1 IU/mL (ref <2)
Thyroperoxidase Ab SerPl-aCnc: 24 IU/mL — ABNORMAL HIGH (ref <9)

## 2015-10-17 LAB — T4, FREE: Free T4: 1 ng/dL (ref 0.8–1.8)

## 2015-10-17 MED ORDER — LEVOTHYROXINE SODIUM 25 MCG PO TABS: 25.0000 ug | ORAL_TABLET | Freq: Every day | ORAL | 1 refills | Status: DC

## 2015-10-17 NOTE — Addendum Note (Signed)
Addended by: Teddy Spike on: 10/17/2015 08:36 AM   Modules accepted: Orders

## 2015-10-23 DIAGNOSIS — R1310 Dysphagia, unspecified: Secondary | ICD-10-CM | POA: Diagnosis not present

## 2015-10-23 DIAGNOSIS — Z1211 Encounter for screening for malignant neoplasm of colon: Secondary | ICD-10-CM | POA: Diagnosis not present

## 2015-10-23 DIAGNOSIS — R194 Change in bowel habit: Secondary | ICD-10-CM | POA: Diagnosis not present

## 2015-10-27 DIAGNOSIS — Z6839 Body mass index (BMI) 39.0-39.9, adult: Secondary | ICD-10-CM | POA: Diagnosis not present

## 2015-10-27 DIAGNOSIS — M180 Bilateral primary osteoarthritis of first carpometacarpal joints: Secondary | ICD-10-CM | POA: Diagnosis not present

## 2015-10-27 DIAGNOSIS — M79631 Pain in right forearm: Secondary | ICD-10-CM | POA: Diagnosis not present

## 2015-11-16 ENCOUNTER — Other Ambulatory Visit: Payer: Self-pay | Admitting: Family Medicine

## 2015-11-18 ENCOUNTER — Telehealth: Payer: Self-pay

## 2015-11-18 NOTE — Telephone Encounter (Signed)
Margaret Beasley's husband called and states we need to call the insurance company for a PA. The insurance company switched her to a 30 day supply of her medications. We would have to call and do a PA for patient to receive her prescriptions for 90 days. [phone] [facility]/ Pharmacy insurance

## 2015-11-20 DIAGNOSIS — D1800 Hemangioma unspecified site: Secondary | ICD-10-CM | POA: Diagnosis not present

## 2015-11-20 DIAGNOSIS — C44612 Basal cell carcinoma of skin of right upper limb, including shoulder: Secondary | ICD-10-CM | POA: Diagnosis not present

## 2015-11-20 DIAGNOSIS — Z23 Encounter for immunization: Secondary | ICD-10-CM | POA: Diagnosis not present

## 2015-11-20 DIAGNOSIS — L570 Actinic keratosis: Secondary | ICD-10-CM | POA: Diagnosis not present

## 2015-11-20 DIAGNOSIS — D485 Neoplasm of uncertain behavior of skin: Secondary | ICD-10-CM | POA: Diagnosis not present

## 2015-11-24 NOTE — Telephone Encounter (Signed)
Im not sure which medication needs PA and I haven't received anything from pharm or insurance company. Called the patient but she has short term memory issues and husband handles medications.called and left a message for husband.

## 2015-11-27 NOTE — Telephone Encounter (Signed)
Completed a PA Drug is covered by current benefit plan. No further PA activity needed.  I talked to a rep last month about this and from my memory I think it wasn't necessary to do a PA. I will have to call again to see what exactly is going on. I think its just that she will have to

## 2015-11-28 NOTE — Telephone Encounter (Signed)
[facility] and completed a quantity review and it was approved from 10/29/15 until 11/27/2018. PA # [id number]. Called spouse and let him know

## 2015-12-01 ENCOUNTER — Other Ambulatory Visit: Payer: Self-pay | Admitting: Family Medicine

## 2015-12-02 ENCOUNTER — Other Ambulatory Visit: Payer: Self-pay | Admitting: *Deleted

## 2015-12-02 MED ORDER — LEVOTHYROXINE SODIUM 25 MCG PO TABS: ORAL_TABLET | ORAL | 1 refills | Status: DC

## 2015-12-16 ENCOUNTER — Other Ambulatory Visit: Payer: Self-pay | Admitting: Family Medicine

## 2015-12-17 ENCOUNTER — Encounter: Payer: Self-pay | Admitting: Family Medicine

## 2015-12-17 ENCOUNTER — Ambulatory Visit (INDEPENDENT_AMBULATORY_CARE_PROVIDER_SITE_OTHER): Payer: BLUE CROSS/BLUE SHIELD | Admitting: Family Medicine

## 2015-12-17 VITALS — BP 142/80 | HR 86 | Wt 231.0 lb

## 2015-12-17 DIAGNOSIS — M797 Fibromyalgia: Secondary | ICD-10-CM | POA: Diagnosis not present

## 2015-12-17 DIAGNOSIS — E039 Hypothyroidism, unspecified: Secondary | ICD-10-CM

## 2015-12-17 DIAGNOSIS — Z23 Encounter for immunization: Secondary | ICD-10-CM | POA: Diagnosis not present

## 2015-12-17 DIAGNOSIS — E010 Iodine-deficiency related diffuse (endemic) goiter: Secondary | ICD-10-CM | POA: Diagnosis not present

## 2015-12-17 DIAGNOSIS — E038 Other specified hypothyroidism: Secondary | ICD-10-CM

## 2015-12-17 NOTE — Patient Instructions (Signed)
Okay to hold the simvastatin as well as the levothyroxin for the next couple months and see if you feel like to getting back on track with the fibromyalgia. We can always try restarting these later on.
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
## 2015-12-28 DIAGNOSIS — J029 Acute pharyngitis, unspecified: Secondary | ICD-10-CM | POA: Diagnosis not present

## 2015-12-28 DIAGNOSIS — H6980 Other specified disorders of Eustachian tube, unspecified ear: Secondary | ICD-10-CM | POA: Diagnosis not present

## 2015-12-28 DIAGNOSIS — R05 Cough: Secondary | ICD-10-CM | POA: Diagnosis not present

## 2015-12-28 DIAGNOSIS — J0100 Acute maxillary sinusitis, unspecified: Secondary | ICD-10-CM | POA: Diagnosis not present

## 2016-01-12 DIAGNOSIS — C44612 Basal cell carcinoma of skin of right upper limb, including shoulder: Secondary | ICD-10-CM | POA: Diagnosis not present

## 2016-01-15 ENCOUNTER — Ambulatory Visit (INDEPENDENT_AMBULATORY_CARE_PROVIDER_SITE_OTHER): Payer: BLUE CROSS/BLUE SHIELD

## 2016-01-15 ENCOUNTER — Other Ambulatory Visit: Payer: Self-pay | Admitting: Family Medicine

## 2016-01-15 ENCOUNTER — Encounter: Payer: Self-pay | Admitting: Family Medicine

## 2016-01-15 ENCOUNTER — Other Ambulatory Visit (HOSPITAL_COMMUNITY)
Admission: RE | Admit: 2016-01-15 | Discharge: 2016-01-15 | Disposition: A | Discharge status: Home / Self Care | Payer: BLUE CROSS/BLUE SHIELD | Source: Ambulatory Visit | Attending: Family Medicine | Admitting: Family Medicine

## 2016-01-15 ENCOUNTER — Ambulatory Visit (INDEPENDENT_AMBULATORY_CARE_PROVIDER_SITE_OTHER): Payer: BLUE CROSS/BLUE SHIELD | Admitting: Family Medicine

## 2016-01-15 VITALS — BP 140/81 | HR 100 | Ht 63.0 in | Wt 230.0 lb

## 2016-01-15 DIAGNOSIS — M545 Low back pain, unspecified: Secondary | ICD-10-CM

## 2016-01-15 DIAGNOSIS — E039 Hypothyroidism, unspecified: Secondary | ICD-10-CM

## 2016-01-15 DIAGNOSIS — Z01419 Encounter for gynecological examination (general) (routine) without abnormal findings: Secondary | ICD-10-CM | POA: Diagnosis not present

## 2016-01-15 DIAGNOSIS — B372 Candidiasis of skin and nail: Secondary | ICD-10-CM | POA: Diagnosis not present

## 2016-01-15 DIAGNOSIS — Z1151 Encounter for screening for human papillomavirus (HPV): Secondary | ICD-10-CM | POA: Diagnosis not present

## 2016-01-15 DIAGNOSIS — N950 Postmenopausal bleeding: Secondary | ICD-10-CM | POA: Diagnosis not present

## 2016-01-15 DIAGNOSIS — M419 Scoliosis, unspecified: Secondary | ICD-10-CM

## 2016-01-15 DIAGNOSIS — R110 Nausea: Secondary | ICD-10-CM

## 2016-01-15 DIAGNOSIS — E038 Other specified hypothyroidism: Secondary | ICD-10-CM

## 2016-01-15 MED ORDER — FLUCONAZOLE 150 MG PO TABS: 150.0000 mg | ORAL_TABLET | ORAL | 0 refills | Status: DC

## 2016-01-15 NOTE — Progress Notes (Signed)
Subjective:    CC: Spotting  HPI:  Patient came in today with complaints of vaginal spotting that occurred about a week ago. It seems to have resolved on its own. She is postmenopausal and 60 years old. She's not currently on any type of hormone replacement therapy.  He also complains about a rash in the groin area and underneath the abdomen that started when she was recently on prednisone.  About 2 weeks ago gets a stinging sensation under her nails on the left hand. Says she feels like it is a sharp stabbing pain and at other times feels like someone holding a match to her nails.  Says it throbs in time with her heart at times.  No trauma or injury. She doesn't get her nails manicured and doesn't wear artificial nails. Only happens on her left hand and when it occurs it's all 5 nails at the same time.  Subclinical hypothyroidism-she actually says she's had much less joint pain since coming off of the thyroid medication.  He is also had some low back pain on and off. She denies any injury or trauma. It's directly over the spine and does not radiate.  None of her medications were changed or adjusted or are new.  She's also been experiencing these brief headaches. They come on very quickly. It can be with walking or with sitting. She'll get instantly nauseated with it and they seemed to ease off fairly quickly. She says this is not at all like her typical headaches. She says in fact her headaches up until recently were very well controlled.  Past medical history, Surgical history, Family history not pertinant except as noted below, Social history, Allergies, and medications have been entered into the medical record, reviewed, and corrections made.   Review of Systems: No fevers, chills, night sweats, weight loss, chest pain, or shortness of breath.   Objective:    General: Well Developed, well nourished, and in no acute distress.  Neuro: Alert and oriented x3, extra-ocular muscles intact,  sensation grossly intact.  HEENT: Normocephalic, atraumatic, Op is clear, TMs and canals are clear.   Skin: Warm and dry, no rashes. Slight erythematous rash along crease of panus and groin creases.  No maceration.   Cardiac: Regular rate and rhythm, no murmurs rubs or gallops, no lower extremity edema.  Respiratory: Clear to auscultation bilaterally. Not using accessory muscles, speaking in full sentences. Abd: soft, NT.  Back: Normal flexion of the lumbar spine with rotation. Tender directly over the lumbar spine. Nontender over the SI joints.   Impression and Recommendations:   Vaginal spotting - Pelvic exam performed today. Exam was essentially normal. Pap smear performed. Will schedule her for pelvic ultrasound. She does have a prior history of breast cancer in situ, status post lumpectomy in 2012, left side. Last mammogram August 2017.  Skin candidiasis- groin area.  Will tx with oral diflucan.    Subclinical hypothyroidism- recheck TSH   Low back pain - Will check UA. She is tender directly over the spine.  Headache/nausea-unclear etiology. Consider further workup if blood work is unrevealing.

## 2016-01-16 LAB — VITAMIN B12: VITAMIN B 12: 456 pg/mL (ref 200–1100)

## 2016-01-16 LAB — URINALYSIS
Bilirubin Urine: NEGATIVE
Glucose, UA: NEGATIVE
HGB URINE DIPSTICK: NEGATIVE
Ketones, ur: NEGATIVE
LEUKOCYTES UA: NEGATIVE
NITRITE: NEGATIVE
PH: 6 (ref 5.0–8.0)
Protein, ur: NEGATIVE
SPECIFIC GRAVITY, URINE: 1.006 (ref 1.001–1.035)

## 2016-01-16 LAB — FOLATE: Folate: 11.5 ng/mL (ref >5.4)

## 2016-01-16 LAB — MAGNESIUM: Magnesium: 2.1 mg/dL (ref 1.5–2.5)

## 2016-01-16 LAB — COMPLETE METABOLIC PANEL WITH GFR
ALT: 13 U/L (ref 6–29)
AST: 19 U/L (ref 10–35)
Albumin: 4.1 g/dL (ref 3.6–5.1)
Alkaline Phosphatase: 66 U/L (ref 33–130)
BILIRUBIN TOTAL: 0.5 mg/dL (ref 0.2–1.2)
BUN: 9 mg/dL (ref 7–25)
CALCIUM: 9.3 mg/dL (ref 8.6–10.4)
CO2: 27 mmol/L (ref 20–31)
CREATININE: 0.94 mg/dL (ref 0.50–1.05)
Chloride: 101 mmol/L (ref 98–110)
GFR, EST AFRICAN AMERICAN: 77 mL/min (ref >=60)
GFR, Est Non African American: 67 mL/min (ref >=60)
Glucose, Bld: 86 mg/dL (ref 65–99)
Potassium: 4.1 mmol/L (ref 3.5–5.3)
Sodium: 139 mmol/L (ref 135–146)
TOTAL PROTEIN: 6.9 g/dL (ref 6.1–8.1)

## 2016-01-16 LAB — C-REACTIVE PROTEIN: CRP: 1 mg/L (ref <8.0)

## 2016-01-16 LAB — SEDIMENTATION RATE: Sed Rate: 17 mm/hr (ref 0–30)

## 2016-01-16 LAB — T4, FREE: FREE T4: 0.9 ng/dL (ref 0.8–1.8)

## 2016-01-16 LAB — TSH: TSH: 4.95 mIU/L — ABNORMAL HIGH

## 2016-01-19 ENCOUNTER — Ambulatory Visit (INDEPENDENT_AMBULATORY_CARE_PROVIDER_SITE_OTHER): Payer: BLUE CROSS/BLUE SHIELD

## 2016-01-19 DIAGNOSIS — Z8742 Personal history of other diseases of the female genital tract: Secondary | ICD-10-CM

## 2016-01-19 DIAGNOSIS — E038 Other specified hypothyroidism: Secondary | ICD-10-CM

## 2016-01-19 DIAGNOSIS — Z853 Personal history of malignant neoplasm of breast: Secondary | ICD-10-CM | POA: Diagnosis not present

## 2016-01-19 DIAGNOSIS — B372 Candidiasis of skin and nail: Secondary | ICD-10-CM

## 2016-01-19 DIAGNOSIS — N939 Abnormal uterine and vaginal bleeding, unspecified: Secondary | ICD-10-CM | POA: Diagnosis not present

## 2016-01-19 DIAGNOSIS — E039 Hypothyroidism, unspecified: Secondary | ICD-10-CM

## 2016-01-19 DIAGNOSIS — N950 Postmenopausal bleeding: Secondary | ICD-10-CM | POA: Diagnosis not present

## 2016-01-20 LAB — CYTOLOGY - PAP
Diagnosis: NEGATIVE
HPV: NOT DETECTED

## 2016-01-20 LAB — VITAMIN B1: VITAMIN B1 (THIAMINE): 13 nmol/L (ref 8–30)

## 2016-01-22 LAB — VITAMIN B6: VITAMIN B6: 7.6 ng/mL (ref 2.1–21.7)

## 2016-03-12 DIAGNOSIS — G40309 Generalized idiopathic epilepsy and epileptic syndromes, not intractable, without status epilepticus: Secondary | ICD-10-CM | POA: Diagnosis not present

## 2016-03-12 DIAGNOSIS — Z6839 Body mass index (BMI) 39.0-39.9, adult: Secondary | ICD-10-CM | POA: Diagnosis not present

## 2016-03-12 DIAGNOSIS — F411 Generalized anxiety disorder: Secondary | ICD-10-CM | POA: Diagnosis not present

## 2016-03-12 DIAGNOSIS — R4189 Other symptoms and signs involving cognitive functions and awareness: Secondary | ICD-10-CM | POA: Diagnosis not present

## 2016-03-12 DIAGNOSIS — G40209 Localization-related (focal) (partial) symptomatic epilepsy and epileptic syndromes with complex partial seizures, not intractable, without status epilepticus: Secondary | ICD-10-CM | POA: Diagnosis not present

## 2016-03-12 DIAGNOSIS — G250 Essential tremor: Secondary | ICD-10-CM | POA: Diagnosis not present

## 2016-04-29 ENCOUNTER — Ambulatory Visit: Payer: Medicare Other | Admitting: Family Medicine

## 2016-06-11 ENCOUNTER — Ambulatory Visit (INDEPENDENT_AMBULATORY_CARE_PROVIDER_SITE_OTHER): Payer: BLUE CROSS/BLUE SHIELD | Admitting: Family Medicine

## 2016-06-11 ENCOUNTER — Encounter: Payer: Self-pay | Admitting: Family Medicine

## 2016-06-11 ENCOUNTER — Ambulatory Visit (INDEPENDENT_AMBULATORY_CARE_PROVIDER_SITE_OTHER): Payer: BLUE CROSS/BLUE SHIELD

## 2016-06-11 VITALS — BP 124/78 | HR 88 | Ht 63.0 in | Wt 235.0 lb

## 2016-06-11 DIAGNOSIS — R109 Unspecified abdominal pain: Secondary | ICD-10-CM | POA: Diagnosis not present

## 2016-06-11 DIAGNOSIS — R110 Nausea: Secondary | ICD-10-CM

## 2016-06-11 DIAGNOSIS — E049 Nontoxic goiter, unspecified: Secondary | ICD-10-CM | POA: Diagnosis not present

## 2016-06-11 LAB — LIPID PANEL W/REFLEX DIRECT LDL
Cholesterol: 314 mg/dL — ABNORMAL HIGH (ref <200)
HDL: 56 mg/dL (ref >50)
LDL-Cholesterol: 218 mg/dL — ABNORMAL HIGH
Non-HDL Cholesterol (Calc): 258 mg/dL — ABNORMAL HIGH (ref <130)
Total CHOL/HDL Ratio: 5.6 Ratio — ABNORMAL HIGH (ref <5.0)
Triglycerides: 210 mg/dL — ABNORMAL HIGH (ref <150)

## 2016-06-11 LAB — CBC WITH DIFFERENTIAL/PLATELET
BASOS ABS: 0 {cells}/uL (ref 0–200)
BASOS PCT: 0 %
EOS PCT: 2 %
Eosinophils Absolute: 68 cells/uL (ref 15–500)
HCT: 43.2 % (ref 35.0–45.0)
HEMOGLOBIN: 14.2 g/dL (ref 11.7–15.5)
LYMPHS ABS: 1190 {cells}/uL (ref 850–3900)
Lymphocytes Relative: 35 %
MCH: 30.3 pg (ref 27.0–33.0)
MCHC: 32.9 g/dL (ref 32.0–36.0)
MCV: 92.3 fL (ref 80.0–100.0)
MPV: 10.4 fL (ref 7.5–12.5)
Monocytes Absolute: 442 cells/uL (ref 200–950)
Monocytes Relative: 13 %
NEUTROS ABS: 1700 {cells}/uL (ref 1500–7800)
Neutrophils Relative %: 50 %
Platelets: 229 10*3/uL (ref 140–400)
RBC: 4.68 MIL/uL (ref 3.80–5.10)
RDW: 14.3 % (ref 11.0–15.0)
WBC: 3.4 10*3/uL — ABNORMAL LOW (ref 3.8–10.8)

## 2016-06-11 LAB — COMPLETE METABOLIC PANEL WITH GFR
AG RATIO: 1.6 ratio (ref 1.0–2.5)
ALK PHOS: 63 U/L (ref 33–130)
ALT: 12 U/L (ref 6–29)
AST: 17 U/L (ref 10–35)
Albumin: 4.1 g/dL (ref 3.6–5.1)
BILIRUBIN TOTAL: 0.4 mg/dL (ref 0.2–1.2)
BUN/Creatinine Ratio: 10.3 Ratio (ref 6–22)
BUN: 12 mg/dL (ref 7–25)
CALCIUM: 9.1 mg/dL (ref 8.6–10.4)
CO2: 24 mmol/L (ref 20–31)
Chloride: 103 mmol/L (ref 98–110)
Creat: 1.17 mg/dL — ABNORMAL HIGH (ref 0.50–0.99)
GFR, EST AFRICAN AMERICAN: 59 mL/min — AB (ref >=60)
GFR, EST NON AFRICAN AMERICAN: 51 mL/min — AB (ref >=60)
GLUCOSE: 98 mg/dL (ref 65–99)
Globulin: 2.6 g/dL (ref 1.9–3.7)
Potassium: 4.9 mmol/L (ref 3.5–5.3)
Sodium: 141 mmol/L (ref 135–146)
TOTAL PROTEIN: 6.7 g/dL (ref 6.1–8.1)
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
## 2016-06-12 DIAGNOSIS — F329 Major depressive disorder, single episode, unspecified: Secondary | ICD-10-CM | POA: Diagnosis not present

## 2016-06-12 DIAGNOSIS — Z885 Allergy status to narcotic agent status: Secondary | ICD-10-CM | POA: Diagnosis not present

## 2016-06-12 DIAGNOSIS — Z880 Allergy status to penicillin: Secondary | ICD-10-CM | POA: Diagnosis not present

## 2016-06-12 DIAGNOSIS — G40409 Other generalized epilepsy and epileptic syndromes, not intractable, without status epilepticus: Secondary | ICD-10-CM | POA: Diagnosis not present

## 2016-06-12 DIAGNOSIS — Z780 Asymptomatic menopausal state: Secondary | ICD-10-CM | POA: Diagnosis not present

## 2016-06-12 DIAGNOSIS — G43909 Migraine, unspecified, not intractable, without status migrainosus: Secondary | ICD-10-CM | POA: Diagnosis not present

## 2016-06-12 DIAGNOSIS — K5900 Constipation, unspecified: Secondary | ICD-10-CM | POA: Diagnosis not present

## 2016-06-12 DIAGNOSIS — G43809 Other migraine, not intractable, without status migrainosus: Secondary | ICD-10-CM | POA: Diagnosis not present

## 2016-06-12 DIAGNOSIS — K5790 Diverticulosis of intestine, part unspecified, without perforation or abscess without bleeding: Secondary | ICD-10-CM | POA: Diagnosis not present

## 2016-06-12 DIAGNOSIS — R569 Unspecified convulsions: Secondary | ICD-10-CM | POA: Diagnosis not present

## 2016-06-12 DIAGNOSIS — R109 Unspecified abdominal pain: Secondary | ICD-10-CM | POA: Diagnosis not present

## 2016-06-12 DIAGNOSIS — M797 Fibromyalgia: Secondary | ICD-10-CM | POA: Diagnosis not present

## 2016-06-12 LAB — TSH: TSH: 3.14 m[IU]/L

## 2016-06-21 ENCOUNTER — Encounter: Payer: Self-pay | Admitting: Family Medicine

## 2016-06-21 ENCOUNTER — Ambulatory Visit (INDEPENDENT_AMBULATORY_CARE_PROVIDER_SITE_OTHER): Payer: BLUE CROSS/BLUE SHIELD | Admitting: Family Medicine

## 2016-06-21 VITALS — BP 135/78 | HR 87 | Ht 63.0 in | Wt 236.0 lb

## 2016-06-21 DIAGNOSIS — R109 Unspecified abdominal pain: Secondary | ICD-10-CM | POA: Diagnosis not present
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
## 2016-06-23 DIAGNOSIS — F411 Generalized anxiety disorder: Secondary | ICD-10-CM | POA: Diagnosis not present

## 2016-06-23 DIAGNOSIS — G253 Myoclonus: Secondary | ICD-10-CM | POA: Diagnosis not present

## 2016-06-23 DIAGNOSIS — R413 Other amnesia: Secondary | ICD-10-CM | POA: Diagnosis not present

## 2016-06-23 DIAGNOSIS — G40309 Generalized idiopathic epilepsy and epileptic syndromes, not intractable, without status epilepticus: Secondary | ICD-10-CM | POA: Diagnosis not present

## 2016-06-23 DIAGNOSIS — Z6841 Body Mass Index (BMI) 40.0 and over, adult: Secondary | ICD-10-CM | POA: Diagnosis not present

## 2016-06-23 DIAGNOSIS — R4189 Other symptoms and signs involving cognitive functions and awareness: Secondary | ICD-10-CM | POA: Diagnosis not present

## 2016-06-23 DIAGNOSIS — G250 Essential tremor: Secondary | ICD-10-CM | POA: Diagnosis not present

## 2016-06-23 DIAGNOSIS — G40209 Localization-related (focal) (partial) symptomatic epilepsy and epileptic syndromes with complex partial seizures, not intractable, without status epilepticus: Secondary | ICD-10-CM | POA: Diagnosis not present

## 2016-06-24 DIAGNOSIS — R413 Other amnesia: Secondary | ICD-10-CM | POA: Diagnosis not present

## 2016-06-24 DIAGNOSIS — R4189 Other symptoms and signs involving cognitive functions and awareness: Secondary | ICD-10-CM | POA: Diagnosis not present

## 2016-06-28 DIAGNOSIS — R569 Unspecified convulsions: Secondary | ICD-10-CM | POA: Diagnosis not present

## 2016-06-28 DIAGNOSIS — R4189 Other symptoms and signs involving cognitive functions and awareness: Secondary | ICD-10-CM | POA: Diagnosis not present

## 2016-06-28 DIAGNOSIS — R413 Other amnesia: Secondary | ICD-10-CM | POA: Diagnosis not present

## 2016-06-30 DIAGNOSIS — R569 Unspecified convulsions: Secondary | ICD-10-CM | POA: Diagnosis not present

## 2016-07-07 DIAGNOSIS — F411 Generalized anxiety disorder: Secondary | ICD-10-CM | POA: Diagnosis not present

## 2016-07-07 DIAGNOSIS — G250 Essential tremor: Secondary | ICD-10-CM | POA: Diagnosis not present

## 2016-07-07 DIAGNOSIS — G40309 Generalized idiopathic epilepsy and epileptic syndromes, not intractable, without status epilepticus: Secondary | ICD-10-CM | POA: Diagnosis not present

## 2016-07-07 DIAGNOSIS — G253 Myoclonus: Secondary | ICD-10-CM | POA: Diagnosis not present

## 2016-07-07 DIAGNOSIS — Z6841 Body Mass Index (BMI) 40.0 and over, adult: Secondary | ICD-10-CM | POA: Diagnosis not present

## 2016-07-07 DIAGNOSIS — R4189 Other symptoms and signs involving cognitive functions and awareness: Secondary | ICD-10-CM | POA: Diagnosis not present

## 2016-07-07 DIAGNOSIS — G40209 Localization-related (focal) (partial) symptomatic epilepsy and epileptic syndromes with complex partial seizures, not intractable, without status epilepticus: Secondary | ICD-10-CM | POA: Diagnosis not present

## 2016-08-09 ENCOUNTER — Other Ambulatory Visit: Payer: Self-pay

## 2016-08-09 DIAGNOSIS — R413 Other amnesia: Secondary | ICD-10-CM

## 2016-08-09 DIAGNOSIS — G40909 Epilepsy, unspecified, not intractable, without status epilepticus: Secondary | ICD-10-CM

## 2016-08-09 NOTE — Progress Notes (Signed)
Patient needs a referral to Neurology because the practice she was with has now closed. Referral sent.

## 2016-09-07 ENCOUNTER — Encounter: Payer: Self-pay | Admitting: Family Medicine

## 2016-09-07 ENCOUNTER — Ambulatory Visit (INDEPENDENT_AMBULATORY_CARE_PROVIDER_SITE_OTHER): Payer: BLUE CROSS/BLUE SHIELD

## 2016-09-07 ENCOUNTER — Ambulatory Visit (INDEPENDENT_AMBULATORY_CARE_PROVIDER_SITE_OTHER): Payer: BLUE CROSS/BLUE SHIELD | Admitting: Family Medicine

## 2016-09-07 VITALS — BP 127/73 | HR 83 | Ht 63.0 in | Wt 236.0 lb

## 2016-09-07 DIAGNOSIS — R202 Paresthesia of skin: Secondary | ICD-10-CM | POA: Diagnosis not present

## 2016-09-07 DIAGNOSIS — G2581 Restless legs syndrome: Secondary | ICD-10-CM | POA: Diagnosis not present

## 2016-09-07 DIAGNOSIS — M545 Low back pain, unspecified: Secondary | ICD-10-CM

## 2016-09-07 DIAGNOSIS — E049 Nontoxic goiter, unspecified: Secondary | ICD-10-CM

## 2016-09-07 DIAGNOSIS — M48061 Spinal stenosis, lumbar region without neurogenic claudication: Secondary | ICD-10-CM

## 2016-09-07 DIAGNOSIS — R252 Cramp and spasm: Secondary | ICD-10-CM | POA: Diagnosis not present

## 2016-09-07 LAB — COMPLETE METABOLIC PANEL WITH GFR
ALBUMIN: 4.2 g/dL (ref 3.6–5.1)
ALK PHOS: 71 U/L (ref 33–130)
ALT: 13 U/L (ref 6–29)
AST: 17 U/L (ref 10–35)
BILIRUBIN TOTAL: 0.4 mg/dL (ref 0.2–1.2)
BUN: 12 mg/dL (ref 7–25)
CALCIUM: 8.9 mg/dL (ref 8.6–10.4)
CO2: 25 mmol/L (ref 20–31)
CREATININE: 1 mg/dL — AB (ref 0.50–0.99)
Chloride: 103 mmol/L (ref 98–110)
GFR, EST AFRICAN AMERICAN: 71 mL/min (ref >=60)
GFR, Est Non African American: 61 mL/min (ref >=60)
Glucose, Bld: 88 mg/dL (ref 65–99)
Potassium: 4.4 mmol/L (ref 3.5–5.3)
Sodium: 139 mmol/L (ref 135–146)
Total Protein: 6.7 g/dL (ref 6.1–8.1)

## 2016-09-07 LAB — IRON: Iron: 66 ug/dL (ref 45–160)

## 2016-09-07 LAB — TSH: TSH: 3.86 m[IU]/L

## 2016-09-07 MED ORDER — CYCLOBENZAPRINE HCL 5 MG PO TABS: 5.0000 mg | ORAL_TABLET | Freq: Every day | ORAL | 0 refills | Status: DC

## 2016-09-07 MED ORDER — DULOXETINE HCL 30 MG PO CPEP: 90.0000 mg | ORAL_CAPSULE | Freq: Every day | ORAL | 2 refills | Status: DC

## 2016-09-07 NOTE — Progress Notes (Signed)
Subjective:    CC: RLS and leg cramps  HPI:  She says her restless leg is bother her more.  Last hemoglobin was 14.2 back in April 2018. She's had it for a long time but says over the last 2 months it's been gradually getting worse. The point where it actually waking her up at night which is very unusual. She's also been getting some tingling in her low back that almost feels like it's in a large circle over the lumbar spine and lower thoracic area. She's also been getting some cramping in her anterior thighs bilaterally. She says this is happened twice in the last month which again she's never had that happen before. She did have a fibromyalgia flare for about 3 weeks several weeks ago. She's been using the Flexeril for the restless leg. It does seem to help. But now she is on a statin medication.  Fibromyalgia - She's also requesting a refill on her Cymbalta and her Flexeril.He was actually very rarely using her Flexeril until more recently.  Past medical history, Surgical history, Family history not pertinant except as noted below, Social history, Allergies, and medications have been entered into the medical record, reviewed, and corrections made.   Review of Systems: No fevers, chills, night sweats, weight loss, chest pain, or shortness of breath.   Objective:    General: Well Developed, well nourished, and in no acute distress.  Neuro: Alert and oriented x3, extra-ocular muscles intact, sensation grossly intact.  HEENT: Normocephalic, atraumatic  Skin: Warm and dry, no rashes. Cardiac: Regular rate and rhythm, no murmurs rubs or gallops, no lower extremity edema.  Respiratory: Clear to auscultation bilaterally. Not using accessory muscles, speaking in full sentences.   Impression and Recommendations:    RLS - Explained that typically we certainly medications such as levodopa/carbidopa and/or were quit. I am concerned that her symptoms have progressed so quickly and the fact that  actually waking her up at night I think that is a little unusual. Recent hemoglobin was normal but will check iron levels.check her thyroid as well.  I want to check her labs before we prescribe anything at this point.  Fibromyalgia - did refill her cymbalta and flexeril.    He also wanted her rheumatoid factor etc. rechecked.  Paresthesias over her low back - I think this is unusual to. She thought maybe it was just a progression of the restless leg but I like to get lumbar films just to rule out any type of growth tumor etc.  Leg cramps-CK and magnesium and thyroid. She is getting them in her upper thighs which is a little bit unusual.

## 2016-09-08 ENCOUNTER — Other Ambulatory Visit: Payer: Self-pay

## 2016-09-08 ENCOUNTER — Other Ambulatory Visit: Payer: Self-pay | Admitting: Family Medicine

## 2016-09-08 DIAGNOSIS — M549 Dorsalgia, unspecified: Secondary | ICD-10-CM

## 2016-09-08 LAB — MAGNESIUM: MAGNESIUM: 2.1 mg/dL (ref 1.5–2.5)

## 2016-09-08 LAB — FERRITIN: Ferritin: 31 ng/mL (ref 20–288)

## 2016-09-08 LAB — C-REACTIVE PROTEIN: CRP: 1.7 mg/L (ref <8.0)

## 2016-09-08 LAB — RHEUMATOID FACTOR

## 2016-09-08 LAB — SEDIMENTATION RATE: SED RATE: 10 mm/h (ref 0–30)

## 2016-09-08 LAB — CYCLIC CITRUL PEPTIDE ANTIBODY, IGG

## 2016-09-08 LAB — CK: Total CK: 102 U/L (ref 29–143)

## 2016-09-08 MED ORDER — CYCLOBENZAPRINE HCL 5 MG PO TABS: 5.0000 mg | ORAL_TABLET | Freq: Every day | ORAL | 0 refills | Status: DC

## 2016-09-08 MED ORDER — DULOXETINE HCL 30 MG PO CPEP: 90.0000 mg | ORAL_CAPSULE | Freq: Every day | ORAL | 2 refills | Status: DC

## 2016-09-13 ENCOUNTER — Other Ambulatory Visit: Payer: Self-pay | Admitting: *Deleted

## 2016-09-13 MED ORDER — DULOXETINE HCL 30 MG PO CPEP: 90.0000 mg | ORAL_CAPSULE | Freq: Every day | ORAL | 2 refills | Status: DC

## 2016-09-13 NOTE — Progress Notes (Signed)
Husband called to add [facility] to patients pharmacy list. They would like for maintenec  medication to be sent there. Called [facility] per his request and canceled the prescription for cymbalta.

## 2016-09-17 ENCOUNTER — Ambulatory Visit (INDEPENDENT_AMBULATORY_CARE_PROVIDER_SITE_OTHER): Payer: BLUE CROSS/BLUE SHIELD | Admitting: Physical Therapy

## 2016-09-17 DIAGNOSIS — M6281 Muscle weakness (generalized): Secondary | ICD-10-CM

## 2016-09-17 DIAGNOSIS — R29898 Other symptoms and signs involving the musculoskeletal system: Secondary | ICD-10-CM

## 2016-09-17 DIAGNOSIS — G8929 Other chronic pain: Secondary | ICD-10-CM | POA: Diagnosis not present

## 2016-09-17 DIAGNOSIS — M545 Low back pain: Secondary | ICD-10-CM | POA: Diagnosis not present
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
## 2016-09-17 NOTE — Patient Instructions (Addendum)
Knee-to-Chest Stretch: Unilateral    With hand behind right knee, pull knee in to chest until a comfortable stretch is felt in lower back and buttocks. Keep back relaxed. Hold __20-30__ seconds. Repeat __2__ times per set. Do __1__ sets per session. Do __2__ sessions per day.  Lumbar Rotation (Non-Weight Bearing)    Feet on floor, slowly rock knees from side to side in small, pain-free range of motion. Allow lower back to rotate slightly. Hold 3-5 sec each side.  Repeat _5-10___ times per set. Do _1___ sets per session. Do __2__ sessions per day.  Forward Bend    Inhale through nose while extending arms above head. Exhale through pursed lips, bending at waist and keeping arms straight. Hold 20 sec. Repeat _2__ times. Do __2_ times per day. Keep tummy tight.   Scapular Retraction (Standing)    With arms at sides, pinch shoulder blades together. Hold 2-3 min Repeat __5-10__ times per set. Do _1___ sets per session. Do _2___ sessions per day.   Flexibility: Neck Retraction    Pull head straight back, keeping eyes and jaw level. Hold 2-3 sec.  Repeat __5-10__ times per set. Do __1__ sets per session. Do 2____ sessions per day.

## 2016-09-28 ENCOUNTER — Telehealth: Payer: Self-pay | Admitting: Physical Therapy

## 2016-09-28 ENCOUNTER — Encounter: Payer: Medicare Other | Admitting: Physical Therapy

## 2016-09-28 ENCOUNTER — Ambulatory Visit: Payer: Medicare Other | Admitting: Family Medicine

## 2016-09-28 NOTE — Telephone Encounter (Signed)
Called patient, she missed her scheduled appointment today. Patient reports she thought it was at 3:30.  That is Thursdays appointment.  She apologized and said she would see [place] Thursday.  She is having some difficulty with her  HEP, " knee cap feels like it is moving out and having back pain".  These will be modified at her appointment.

## 2016-09-30 ENCOUNTER — Encounter: Payer: Medicare Other | Admitting: Physical Therapy

## 2016-10-04 ENCOUNTER — Encounter: Payer: Medicare Other | Admitting: Physical Therapy

## 2016-10-07 ENCOUNTER — Encounter: Payer: Medicare Other | Admitting: Physical Therapy

## 2016-10-19 ENCOUNTER — Ambulatory Visit (INDEPENDENT_AMBULATORY_CARE_PROVIDER_SITE_OTHER): Payer: BLUE CROSS/BLUE SHIELD | Admitting: Family Medicine

## 2016-10-19 ENCOUNTER — Encounter: Payer: Self-pay | Admitting: Family Medicine

## 2016-10-19 VITALS — BP 120/75 | HR 87 | Wt 233.0 lb

## 2016-10-19 DIAGNOSIS — E7800 Pure hypercholesterolemia, unspecified: Secondary | ICD-10-CM

## 2016-10-19 DIAGNOSIS — G2581 Restless legs syndrome: Secondary | ICD-10-CM | POA: Diagnosis not present

## 2016-10-19 DIAGNOSIS — M25561 Pain in right knee: Secondary | ICD-10-CM | POA: Diagnosis not present

## 2016-10-19 DIAGNOSIS — M545 Low back pain, unspecified: Secondary | ICD-10-CM

## 2016-10-19 DIAGNOSIS — G8929 Other chronic pain: Secondary | ICD-10-CM | POA: Diagnosis not present

## 2016-10-19 HISTORY — DX: Restless legs syndrome: G25.81

## 2016-10-19 HISTORY — DX: Pure hypercholesterolemia, unspecified: E78.00

## 2016-10-19 MED ORDER — SIMVASTATIN 40 MG PO TABS: 40.0000 mg | ORAL_TABLET | Freq: Every day | ORAL | 3 refills | Status: DC

## 2016-10-19 MED ORDER — ROPINIROLE HCL 0.25 MG PO TABS: 0.2500 mg | ORAL_TABLET | Freq: Every day | ORAL | 1 refills | Status: DC

## 2016-10-19 MED ORDER — CYCLOBENZAPRINE HCL 5 MG PO TABS: 5.0000 mg | ORAL_TABLET | Freq: Every day | ORAL | 1 refills | Status: DC

## 2016-10-19 NOTE — Patient Instructions (Addendum)
Please call Physical Therapy [phone] for your back Recommend a 6 week trial.
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
## 2016-10-22 ENCOUNTER — Other Ambulatory Visit: Payer: Self-pay | Admitting: Family Medicine

## 2016-10-22 DIAGNOSIS — Z853 Personal history of malignant neoplasm of breast: Secondary | ICD-10-CM

## 2016-10-29 ENCOUNTER — Ambulatory Visit: Payer: BLUE CROSS/BLUE SHIELD | Admitting: Physical Therapy

## 2016-11-02 ENCOUNTER — Ambulatory Visit: Payer: BLUE CROSS/BLUE SHIELD | Admitting: Family Medicine

## 2016-11-16 DIAGNOSIS — Z87898 Personal history of other specified conditions: Secondary | ICD-10-CM | POA: Diagnosis not present

## 2016-11-16 DIAGNOSIS — G40909 Epilepsy, unspecified, not intractable, without status epilepticus: Secondary | ICD-10-CM | POA: Diagnosis not present

## 2016-11-16 DIAGNOSIS — R413 Other amnesia: Secondary | ICD-10-CM | POA: Diagnosis not present

## 2016-11-16 DIAGNOSIS — R251 Tremor, unspecified: Secondary | ICD-10-CM | POA: Diagnosis not present

## 2016-11-22 ENCOUNTER — Telehealth: Payer: Self-pay | Admitting: *Deleted

## 2016-11-22 NOTE — Telephone Encounter (Signed)
If on 2 tabs then drop to one tab for 3 days and then stop. If only on 1 tab a day then [facility] to stop.  She is only a very low dose.

## 2016-11-22 NOTE — Telephone Encounter (Signed)
Pt called and lvm indicating the Requip has caused her to have headaches, stomach pain, and bad dreams. She would like to know how she should go about weaning off of this medication.  Will fwd to pcp for advice. She started this on 10/19/16.Audelia Hives Max

## 2016-11-23 NOTE — Telephone Encounter (Signed)
Pt is only on 1 tab. Advised of recommendations. Margaret Beasley, Margaret Beasley

## 2016-11-29 ENCOUNTER — Ambulatory Visit
Admission: RE | Admit: 2016-11-29 | Discharge: 2016-11-29 | Disposition: A | Discharge status: Home / Self Care | Payer: BLUE CROSS/BLUE SHIELD | Source: Ambulatory Visit | Attending: Family Medicine | Admitting: Family Medicine

## 2016-11-29 DIAGNOSIS — Z853 Personal history of malignant neoplasm of breast: Secondary | ICD-10-CM

## 2016-11-29 DIAGNOSIS — R928 Other abnormal and inconclusive findings on diagnostic imaging of breast: Secondary | ICD-10-CM | POA: Diagnosis not present

## 2016-12-22 ENCOUNTER — Ambulatory Visit: Payer: BLUE CROSS/BLUE SHIELD | Admitting: Family Medicine

## 2017-01-10 DIAGNOSIS — L820 Inflamed seborrheic keratosis: Secondary | ICD-10-CM | POA: Diagnosis not present

## 2017-01-10 DIAGNOSIS — L570 Actinic keratosis: Secondary | ICD-10-CM | POA: Diagnosis not present

## 2017-01-10 DIAGNOSIS — D485 Neoplasm of uncertain behavior of skin: Secondary | ICD-10-CM | POA: Diagnosis not present

## 2017-01-12 ENCOUNTER — Encounter: Payer: Self-pay | Admitting: Family Medicine

## 2017-01-12 DIAGNOSIS — Z85828 Personal history of other malignant neoplasm of skin: Secondary | ICD-10-CM | POA: Insufficient documentation

## 2017-01-12 HISTORY — DX: Personal history of other malignant neoplasm of skin: Z85.828

## 2017-01-21 ENCOUNTER — Ambulatory Visit: Payer: BLUE CROSS/BLUE SHIELD | Admitting: Physician Assistant

## 2017-02-22 DIAGNOSIS — L570 Actinic keratosis: Secondary | ICD-10-CM | POA: Diagnosis not present

## 2017-02-22 DIAGNOSIS — C44619 Basal cell carcinoma of skin of left upper limb, including shoulder: Secondary | ICD-10-CM | POA: Diagnosis not present

## 2017-04-25 DIAGNOSIS — R419 Unspecified symptoms and signs involving cognitive functions and awareness: Secondary | ICD-10-CM | POA: Diagnosis not present

## 2017-04-29 ENCOUNTER — Encounter: Payer: Self-pay | Admitting: Family Medicine

## 2017-04-29 ENCOUNTER — Ambulatory Visit (INDEPENDENT_AMBULATORY_CARE_PROVIDER_SITE_OTHER): Payer: BLUE CROSS/BLUE SHIELD | Admitting: Family Medicine

## 2017-04-29 VITALS — BP 139/71 | HR 90 | Ht 63.0 in | Wt 235.0 lb

## 2017-04-29 DIAGNOSIS — H5462 Unqualified visual loss, left eye, normal vision right eye: Secondary | ICD-10-CM

## 2017-04-29 DIAGNOSIS — R1314 Dysphagia, pharyngoesophageal phase: Secondary | ICD-10-CM

## 2017-04-29 DIAGNOSIS — Z1211 Encounter for screening for malignant neoplasm of colon: Secondary | ICD-10-CM | POA: Diagnosis not present

## 2017-04-29 DIAGNOSIS — R1032 Left lower quadrant pain: Secondary | ICD-10-CM

## 2017-04-29 DIAGNOSIS — R221 Localized swelling, mass and lump, neck: Secondary | ICD-10-CM

## 2017-04-29 DIAGNOSIS — M797 Fibromyalgia: Secondary | ICD-10-CM | POA: Diagnosis not present

## 2017-04-29 DIAGNOSIS — R0789 Other chest pain: Secondary | ICD-10-CM | POA: Diagnosis not present

## 2017-04-29 NOTE — Patient Instructions (Signed)
Decrease Cymbalta down to twice a day for 1 week, then decrease down to once a day for 1 week.  Then decrease down to 1 tab every other day for 1 week and then okay to stop the medication.  If you start to feel a little unusual we can always slow the taper down if we need to.  Please just call.
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
## 2017-04-30 LAB — TSH: TSH: 3.89 m[IU]/L (ref 0.40–4.50)

## 2017-04-30 LAB — URINALYSIS, ROUTINE W REFLEX MICROSCOPIC
BILIRUBIN URINE: NEGATIVE
Glucose, UA: NEGATIVE
HGB URINE DIPSTICK: NEGATIVE
KETONES UR: NEGATIVE
Leukocytes, UA: NEGATIVE
Nitrite: NEGATIVE
PROTEIN: NEGATIVE
Specific Gravity, Urine: 1.013 (ref 1.001–1.03)
pH: <=5 (ref 5.0–8.0)

## 2017-04-30 LAB — CBC WITH DIFFERENTIAL/PLATELET
Basophils Absolute: 21 cells/uL (ref 0–200)
Basophils Relative: 0.4 %
Eosinophils Absolute: 159 cells/uL (ref 15–500)
Eosinophils Relative: 3 %
HCT: 40.3 % (ref 35.0–45.0)
Hemoglobin: 14 g/dL (ref 11.7–15.5)
Lymphs Abs: 1855 cells/uL (ref 850–3900)
MCH: 31.6 pg (ref 27.0–33.0)
MCHC: 34.7 g/dL (ref 32.0–36.0)
MCV: 91 fL (ref 80.0–100.0)
MONOS PCT: 11.5 %
MPV: 10.6 fL (ref 7.5–12.5)
Neutro Abs: 2655 cells/uL (ref 1500–7800)
Neutrophils Relative %: 50.1 %
PLATELETS: 237 10*3/uL (ref 140–400)
RBC: 4.43 10*6/uL (ref 3.80–5.10)
RDW: 12.2 % (ref 11.0–15.0)
TOTAL LYMPHOCYTE: 35 %
WBC: 5.3 10*3/uL (ref 3.8–10.8)
WBCMIX: 610 {cells}/uL (ref 200–950)

## 2017-04-30 LAB — COMPLETE METABOLIC PANEL WITH GFR
AG Ratio: 1.7 (calc) (ref 1.0–2.5)
ALBUMIN MSPROF: 4.3 g/dL (ref 3.6–5.1)
ALKALINE PHOSPHATASE (APISO): 74 U/L (ref 33–130)
ALT: 16 U/L (ref 6–29)
AST: 18 U/L (ref 10–35)
BUN / CREAT RATIO: 12 (calc) (ref 6–22)
BUN: 13 mg/dL (ref 7–25)
CO2: 30 mmol/L (ref 20–32)
Calcium: 9.8 mg/dL (ref 8.6–10.4)
Chloride: 104 mmol/L (ref 98–110)
Creat: 1.06 mg/dL — ABNORMAL HIGH (ref 0.50–0.99)
GFR, Est African American: 66 mL/min/{1.73_m2} (ref >=60)
GFR, Est Non African American: 57 mL/min/{1.73_m2} — ABNORMAL LOW (ref >=60)
GLOBULIN: 2.6 g/dL (ref 1.9–3.7)
GLUCOSE: 95 mg/dL (ref 65–99)
Potassium: 4.7 mmol/L (ref 3.5–5.3)
SODIUM: 142 mmol/L (ref 135–146)
Total Bilirubin: 0.5 mg/dL (ref 0.2–1.2)
Total Protein: 6.9 g/dL (ref 6.1–8.1)

## 2017-04-30 LAB — LIPASE: Lipase: 35 U/L (ref 7–60)

## 2017-04-30 LAB — AMYLASE: AMYLASE: 56 U/L (ref 21–101)

## 2017-05-02 ENCOUNTER — Telehealth: Payer: Self-pay

## 2017-05-02 NOTE — Telephone Encounter (Signed)
Donnielle is calling about lab results.

## 2017-05-02 NOTE — Telephone Encounter (Signed)
Call pt: please tell her I am sorry I didn't see them in my in-basket. Pancreas looks OK. No pancreatitis.  Kidney function is stable. Liver and blood salts rea all normal. Blood count is normal. No anemia or infection. Urins is OK.  Thyroid is normal. Nothing worrisome on the labwork.  Has she heard from GI yet.

## 2017-05-03 NOTE — Telephone Encounter (Signed)
Margaret Beasley,  Margaret Beasley has not heard from GI. Could you reach out to GI.   Patient advised of results.

## 2017-05-04 ENCOUNTER — Ambulatory Visit (INDEPENDENT_AMBULATORY_CARE_PROVIDER_SITE_OTHER): Payer: BLUE CROSS/BLUE SHIELD

## 2017-05-04 DIAGNOSIS — R221 Localized swelling, mass and lump, neck: Secondary | ICD-10-CM

## 2017-05-04 NOTE — Telephone Encounter (Signed)
Pt advised, she has already been scheduled.

## 2017-05-04 NOTE — Telephone Encounter (Signed)
Referral was just sent to [facility] on Monday they will contact patient with appointment - CF

## 2017-05-05 DIAGNOSIS — H02835 Dermatochalasis of left lower eyelid: Secondary | ICD-10-CM | POA: Diagnosis not present

## 2017-05-05 DIAGNOSIS — H02832 Dermatochalasis of right lower eyelid: Secondary | ICD-10-CM | POA: Diagnosis not present

## 2017-05-05 DIAGNOSIS — H02834 Dermatochalasis of left upper eyelid: Secondary | ICD-10-CM | POA: Diagnosis not present

## 2017-05-05 DIAGNOSIS — H02831 Dermatochalasis of right upper eyelid: Secondary | ICD-10-CM | POA: Diagnosis not present

## 2017-05-11 ENCOUNTER — Telehealth: Payer: Self-pay | Admitting: *Deleted

## 2017-05-11 NOTE — Telephone Encounter (Signed)
Screening form completed,faxed,confirmation received, and scanned into pt's chart .Margaret Beasley

## 2017-05-20 ENCOUNTER — Ambulatory Visit: Payer: BLUE CROSS/BLUE SHIELD | Admitting: Family Medicine

## 2017-06-08 DIAGNOSIS — R251 Tremor, unspecified: Secondary | ICD-10-CM | POA: Diagnosis not present

## 2017-06-08 DIAGNOSIS — G40909 Epilepsy, unspecified, not intractable, without status epilepticus: Secondary | ICD-10-CM | POA: Diagnosis not present

## 2017-06-08 DIAGNOSIS — I679 Cerebrovascular disease, unspecified: Secondary | ICD-10-CM | POA: Diagnosis not present

## 2017-06-08 DIAGNOSIS — R413 Other amnesia: Secondary | ICD-10-CM | POA: Diagnosis not present

## 2017-06-13 DIAGNOSIS — I679 Cerebrovascular disease, unspecified: Secondary | ICD-10-CM | POA: Diagnosis not present

## 2017-06-13 DIAGNOSIS — R413 Other amnesia: Secondary | ICD-10-CM | POA: Diagnosis not present

## 2017-06-13 DIAGNOSIS — G40909 Epilepsy, unspecified, not intractable, without status epilepticus: Secondary | ICD-10-CM | POA: Diagnosis not present

## 2017-06-13 DIAGNOSIS — I6782 Cerebral ischemia: Secondary | ICD-10-CM | POA: Diagnosis not present

## 2017-07-06 ENCOUNTER — Encounter: Payer: Self-pay | Admitting: Family Medicine

## 2017-07-06 ENCOUNTER — Ambulatory Visit (INDEPENDENT_AMBULATORY_CARE_PROVIDER_SITE_OTHER): Payer: BLUE CROSS/BLUE SHIELD | Admitting: Family Medicine

## 2017-07-06 VITALS — BP 137/82 | HR 82 | Ht 63.0 in | Wt 235.0 lb

## 2017-07-06 DIAGNOSIS — L821 Other seborrheic keratosis: Secondary | ICD-10-CM

## 2017-07-06 DIAGNOSIS — L989 Disorder of the skin and subcutaneous tissue, unspecified: Secondary | ICD-10-CM | POA: Diagnosis not present

## 2017-07-06 DIAGNOSIS — M545 Low back pain, unspecified: Secondary | ICD-10-CM

## 2017-07-06 DIAGNOSIS — Z85828 Personal history of other malignant neoplasm of skin: Secondary | ICD-10-CM | POA: Diagnosis not present

## 2017-07-06 MED ORDER — PREDNISONE 20 MG PO TABS: 40.0000 mg | ORAL_TABLET | Freq: Every day | ORAL | 0 refills | Status: DC
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
## 2017-07-21 DIAGNOSIS — D485 Neoplasm of uncertain behavior of skin: Secondary | ICD-10-CM | POA: Diagnosis not present

## 2017-07-21 DIAGNOSIS — L905 Scar conditions and fibrosis of skin: Secondary | ICD-10-CM | POA: Diagnosis not present

## 2017-07-27 DIAGNOSIS — R569 Unspecified convulsions: Secondary | ICD-10-CM | POA: Diagnosis not present

## 2017-07-27 DIAGNOSIS — M25561 Pain in right knee: Secondary | ICD-10-CM | POA: Diagnosis not present

## 2017-07-27 DIAGNOSIS — Z888 Allergy status to other drugs, medicaments and biological substances status: Secondary | ICD-10-CM | POA: Diagnosis not present

## 2017-07-27 DIAGNOSIS — Z79899 Other long term (current) drug therapy: Secondary | ICD-10-CM | POA: Diagnosis not present

## 2017-07-27 DIAGNOSIS — M797 Fibromyalgia: Secondary | ICD-10-CM | POA: Diagnosis not present

## 2017-07-27 DIAGNOSIS — G2581 Restless legs syndrome: Secondary | ICD-10-CM | POA: Diagnosis not present

## 2017-07-27 DIAGNOSIS — F419 Anxiety disorder, unspecified: Secondary | ICD-10-CM | POA: Diagnosis not present

## 2017-07-27 DIAGNOSIS — R000 Tachycardia, unspecified: Secondary | ICD-10-CM | POA: Diagnosis not present

## 2017-07-27 DIAGNOSIS — M1990 Unspecified osteoarthritis, unspecified site: Secondary | ICD-10-CM | POA: Diagnosis not present

## 2017-07-27 DIAGNOSIS — F329 Major depressive disorder, single episode, unspecified: Secondary | ICD-10-CM | POA: Diagnosis not present

## 2017-07-27 DIAGNOSIS — E039 Hypothyroidism, unspecified: Secondary | ICD-10-CM | POA: Diagnosis not present

## 2017-07-27 DIAGNOSIS — Z880 Allergy status to penicillin: Secondary | ICD-10-CM | POA: Diagnosis not present

## 2017-07-29 DIAGNOSIS — M1711 Unilateral primary osteoarthritis, right knee: Secondary | ICD-10-CM | POA: Diagnosis not present

## 2017-07-29 DIAGNOSIS — R52 Pain, unspecified: Secondary | ICD-10-CM | POA: Diagnosis not present

## 2017-07-29 DIAGNOSIS — Z6841 Body Mass Index (BMI) 40.0 and over, adult: Secondary | ICD-10-CM | POA: Diagnosis not present

## 2017-08-22 ENCOUNTER — Telehealth: Payer: Self-pay

## 2017-08-22 DIAGNOSIS — E7800 Pure hypercholesterolemia, unspecified: Secondary | ICD-10-CM

## 2017-08-22 DIAGNOSIS — E782 Mixed hyperlipidemia: Secondary | ICD-10-CM

## 2017-08-22 NOTE — Telephone Encounter (Signed)
Ordered labs for cholesterol.

## 2017-08-24 DIAGNOSIS — E782 Mixed hyperlipidemia: Secondary | ICD-10-CM | POA: Diagnosis not present

## 2017-08-24 DIAGNOSIS — E7800 Pure hypercholesterolemia, unspecified: Secondary | ICD-10-CM | POA: Diagnosis not present

## 2017-08-24 LAB — COMPLETE METABOLIC PANEL WITH GFR
AG RATIO: 1.7 (calc) (ref 1.0–2.5)
ALBUMIN MSPROF: 4.3 g/dL (ref 3.6–5.1)
ALKALINE PHOSPHATASE (APISO): 70 U/L (ref 33–130)
ALT: 13 U/L (ref 6–29)
AST: 15 U/L (ref 10–35)
BILIRUBIN TOTAL: 0.5 mg/dL (ref 0.2–1.2)
BUN / CREAT RATIO: 13 (calc) (ref 6–22)
BUN: 13 mg/dL (ref 7–25)
CO2: 30 mmol/L (ref 20–32)
Calcium: 9.3 mg/dL (ref 8.6–10.4)
Chloride: 103 mmol/L (ref 98–110)
Creat: 1.04 mg/dL — ABNORMAL HIGH (ref 0.50–0.99)
GFR, Est African American: 67 mL/min/{1.73_m2} (ref >=60)
GFR, Est Non African American: 58 mL/min/{1.73_m2} — ABNORMAL LOW (ref >=60)
GLUCOSE: 109 mg/dL — AB (ref 65–99)
Globulin: 2.5 g/dL (calc) (ref 1.9–3.7)
Potassium: 4.9 mmol/L (ref 3.5–5.3)
Sodium: 140 mmol/L (ref 135–146)
Total Protein: 6.8 g/dL (ref 6.1–8.1)

## 2017-08-24 LAB — LIPID PANEL W/REFLEX DIRECT LDL
Cholesterol: 195 mg/dL (ref <200)
HDL: 58 mg/dL (ref >50)
LDL CHOLESTEROL (CALC): 108 mg/dL — AB
NON-HDL CHOLESTEROL (CALC): 137 mg/dL — AB (ref <130)
TRIGLYCERIDES: 176 mg/dL — AB (ref <150)
Total CHOL/HDL Ratio: 3.4 (calc) (ref <5.0)

## 2017-08-29 ENCOUNTER — Other Ambulatory Visit: Payer: Self-pay | Admitting: *Deleted

## 2017-08-29 MED ORDER — SIMVASTATIN 40 MG PO TABS: 40.0000 mg | ORAL_TABLET | Freq: Every day | ORAL | 3 refills | Status: DC

## 2017-09-16 ENCOUNTER — Other Ambulatory Visit: Payer: Self-pay | Admitting: Family Medicine

## 2017-09-19 ENCOUNTER — Telehealth: Payer: Self-pay

## 2017-09-19 MED ORDER — DULOXETINE HCL 30 MG PO CPEP: 90.0000 mg | ORAL_CAPSULE | Freq: Every day | ORAL | 2 refills | Status: DC

## 2017-09-19 MED ORDER — DULOXETINE HCL 30 MG PO CPEP: 90.0000 mg | ORAL_CAPSULE | Freq: Every day | ORAL | 0 refills | Status: DC

## 2017-09-19 NOTE — Telephone Encounter (Signed)
Pt advised, no further needs at this time

## 2017-09-19 NOTE — Telephone Encounter (Signed)
OK both Rx sent, one to local and one to mail order.

## 2017-09-19 NOTE — Telephone Encounter (Signed)
Pt called requesting a refill on her Cymbalta. Per pt, she is taking 90 mg (3 of the 30 mg capsules) daily and only has 5 capsules left.  Per office note on 04-29-17 Dr Madilyn Fireman notes "Fibromyalgia -she would like to try weaning the Cymbalta.  She just not convinced however it is helping right now.  She felt like it was helpful initially but feels like it is lost its effectiveness.  We can certainly taper over the next 3 weeks.  As she comes off of it she will have a better idea of how much it is helping her or not helping her.  We could consider other medications such as Savella.  Or an SN RI."  Called pt, she states that she has short-term memory issues and forgot about this. She tried coming off of Cymbalta but "it kills me". Pt wishes to stay on medication. She has been taking the 90 mg daily.   OK for pt to restart medication? Please advise.   Pt states she needs short term supply sent to local pharmacy and 90 day sent to mail order.

## 2017-11-02 DIAGNOSIS — G40909 Epilepsy, unspecified, not intractable, without status epilepticus: Secondary | ICD-10-CM | POA: Diagnosis not present

## 2017-11-15 ENCOUNTER — Other Ambulatory Visit: Payer: Self-pay | Admitting: Family Medicine

## 2017-11-15 DIAGNOSIS — Z1231 Encounter for screening mammogram for malignant neoplasm of breast: Secondary | ICD-10-CM

## 2017-11-17 DIAGNOSIS — L910 Hypertrophic scar: Secondary | ICD-10-CM | POA: Diagnosis not present

## 2017-12-15 ENCOUNTER — Ambulatory Visit: Payer: BLUE CROSS/BLUE SHIELD

## 2018-03-07 ENCOUNTER — Encounter: Payer: Self-pay | Admitting: Family Medicine

## 2018-03-07 ENCOUNTER — Ambulatory Visit (INDEPENDENT_AMBULATORY_CARE_PROVIDER_SITE_OTHER): Payer: BLUE CROSS/BLUE SHIELD | Admitting: Family Medicine

## 2018-03-07 VITALS — BP 135/80 | HR 90 | Ht 63.0 in | Wt 244.0 lb

## 2018-03-07 DIAGNOSIS — R635 Abnormal weight gain: Secondary | ICD-10-CM

## 2018-03-07 DIAGNOSIS — N183 Chronic kidney disease, stage 3 (moderate): Secondary | ICD-10-CM

## 2018-03-07 DIAGNOSIS — M797 Fibromyalgia: Secondary | ICD-10-CM | POA: Diagnosis not present

## 2018-03-07 DIAGNOSIS — R0789 Other chest pain: Secondary | ICD-10-CM | POA: Diagnosis not present

## 2018-03-07 DIAGNOSIS — N1832 Chronic kidney disease, stage 3b: Secondary | ICD-10-CM

## 2018-03-07 DIAGNOSIS — E038 Other specified hypothyroidism: Secondary | ICD-10-CM

## 2018-03-07 DIAGNOSIS — E538 Deficiency of other specified B group vitamins: Secondary | ICD-10-CM

## 2018-03-07 DIAGNOSIS — J0100 Acute maxillary sinusitis, unspecified: Secondary | ICD-10-CM | POA: Diagnosis not present

## 2018-03-07 DIAGNOSIS — G2581 Restless legs syndrome: Secondary | ICD-10-CM

## 2018-03-07 DIAGNOSIS — E039 Hypothyroidism, unspecified: Secondary | ICD-10-CM

## 2018-03-07 MED ORDER — AZITHROMYCIN 250 MG PO TABS: ORAL_TABLET | ORAL | 0 refills | Status: AC

## 2018-03-07 MED ORDER — CYCLOBENZAPRINE HCL 5 MG PO TABS: 5.0000 mg | ORAL_TABLET | Freq: Every day | ORAL | 1 refills | Status: DC
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
## 2018-03-10 LAB — CBC
HEMATOCRIT: 44.4 % (ref 35.0–45.0)
Hemoglobin: 14.9 g/dL (ref 11.7–15.5)
MCH: 31 pg (ref 27.0–33.0)
MCHC: 33.6 g/dL (ref 32.0–36.0)
MCV: 92.3 fL (ref 80.0–100.0)
MPV: 10.9 fL (ref 7.5–12.5)
PLATELETS: 217 10*3/uL (ref 140–400)
RBC: 4.81 10*6/uL (ref 3.80–5.10)
RDW: 12.7 % (ref 11.0–15.0)
WBC: 4.7 10*3/uL (ref 3.8–10.8)

## 2018-03-10 LAB — COMPLETE METABOLIC PANEL WITH GFR
AG Ratio: 1.7 (calc) (ref 1.0–2.5)
ALKALINE PHOSPHATASE (APISO): 65 U/L (ref 33–130)
ALT: 20 U/L (ref 6–29)
AST: 21 U/L (ref 10–35)
Albumin: 4.4 g/dL (ref 3.6–5.1)
BUN/Creatinine Ratio: 8 (calc) (ref 6–22)
BUN: 9 mg/dL (ref 7–25)
CO2: 30 mmol/L (ref 20–32)
CREATININE: 1.14 mg/dL — AB (ref 0.50–0.99)
Calcium: 9.3 mg/dL (ref 8.6–10.4)
Chloride: 102 mmol/L (ref 98–110)
GFR, Est African American: 60 mL/min/{1.73_m2} (ref >=60)
GFR, Est Non African American: 51 mL/min/{1.73_m2} — ABNORMAL LOW (ref >=60)
GLUCOSE: 89 mg/dL (ref 65–99)
Globulin: 2.6 g/dL (calc) (ref 1.9–3.7)
Potassium: 4.5 mmol/L (ref 3.5–5.3)
Sodium: 139 mmol/L (ref 135–146)
Total Bilirubin: 0.5 mg/dL (ref 0.2–1.2)
Total Protein: 7 g/dL (ref 6.1–8.1)

## 2018-03-10 LAB — TSH: TSH: 5.7 mIU/L — ABNORMAL HIGH (ref 0.40–4.50)

## 2018-03-10 LAB — SEDIMENTATION RATE: Sed Rate: 19 mm/h (ref 0–30)

## 2018-03-10 LAB — T3, FREE: T3, Free: 2.7 pg/mL (ref 2.3–4.2)

## 2018-03-10 LAB — T4, FREE: Free T4: 1.1 ng/dL (ref 0.8–1.8)

## 2018-03-10 LAB — VITAMIN B12: Vitamin B-12: 354 pg/mL (ref 200–1100)

## 2018-03-13 ENCOUNTER — Telehealth: Payer: Self-pay

## 2018-03-13 DIAGNOSIS — E039 Hypothyroidism, unspecified: Secondary | ICD-10-CM

## 2018-03-13 DIAGNOSIS — E038 Other specified hypothyroidism: Secondary | ICD-10-CM

## 2018-03-13 MED ORDER — LEVOTHYROXINE SODIUM 25 MCG PO TABS: 25.0000 ug | ORAL_TABLET | Freq: Every day | ORAL | 1 refills | Status: DC

## 2018-03-13 MED ORDER — CEFDINIR 300 MG PO CAPS: 300.0000 mg | ORAL_CAPSULE | Freq: Two times a day (BID) | ORAL | 0 refills | Status: DC

## 2018-03-13 NOTE — Telephone Encounter (Signed)
1- She would like a low dose of Thyroid medication. She states she has night sweats.    2- She still complains of congestion and drainage. Denies fever or chills. She would like a different antibiotic.

## 2018-03-13 NOTE — Telephone Encounter (Signed)
Scription sent to pharmacy for new antibiotic.  Also send over new prescription for thyroid medication.  I want to recheck her thyroid level in about 6 weeks.

## 2018-03-14 ENCOUNTER — Other Ambulatory Visit: Payer: Self-pay | Admitting: Family Medicine

## 2018-03-14 MED ORDER — LEVOTHYROXINE SODIUM 50 MCG PO TABS: 50.0000 ug | ORAL_TABLET | Freq: Every day | ORAL | 1 refills | Status: DC

## 2018-03-14 NOTE — Telephone Encounter (Signed)
Patient advised of recommendations.  

## 2018-03-18 IMAGING — US US SOFT TISSUE HEAD/NECK
1 series · 14 of 25 positions shown · non-contrast
Comparison: None.

CLINICAL DATA: Thyroid enlargement

EXAM:
THYROID ULTRASOUND
TECHNIQUE: Ultrasound examination of the thyroid gland and adjacent soft
tissues was performed.

[Series 1: us soft tissue head/neck · 0.07mm/px · 14 of 49 slices shown]
[im 1/49]
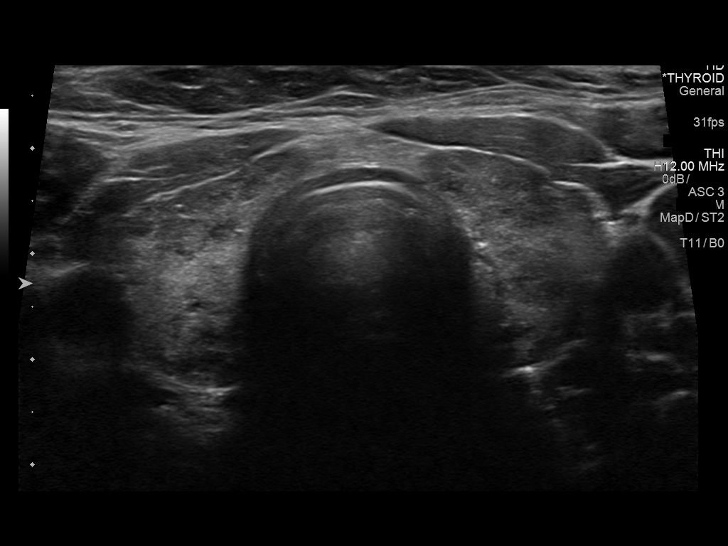
[im 5/49]
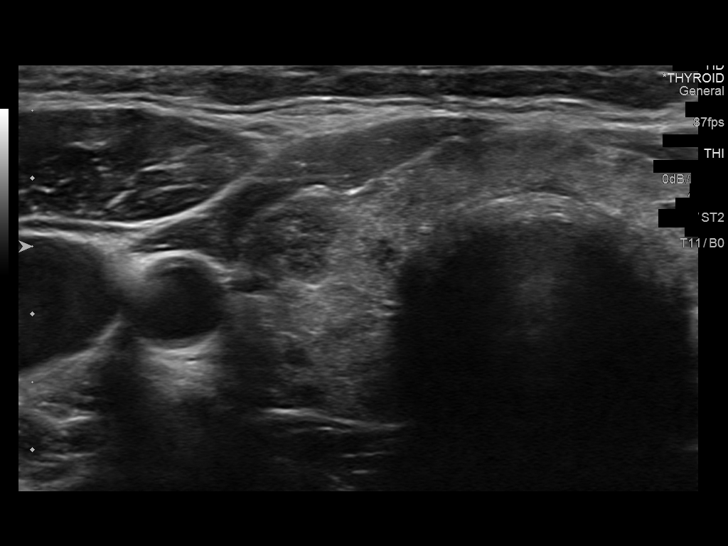
[im 9/49]
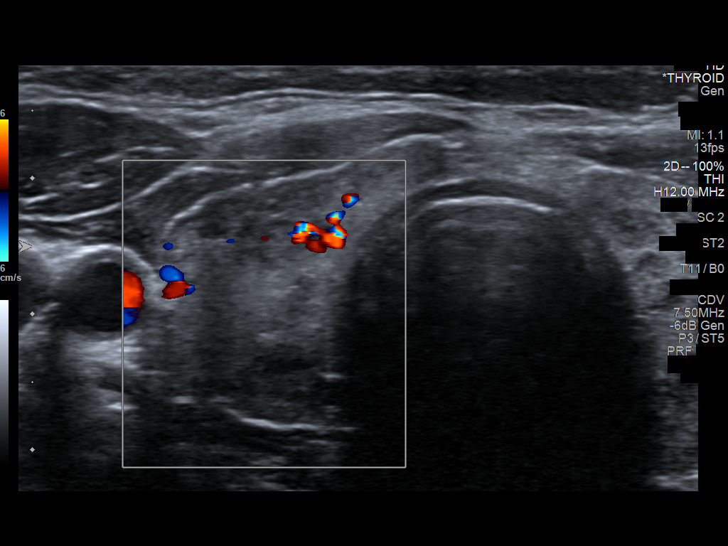
[im 13/49]
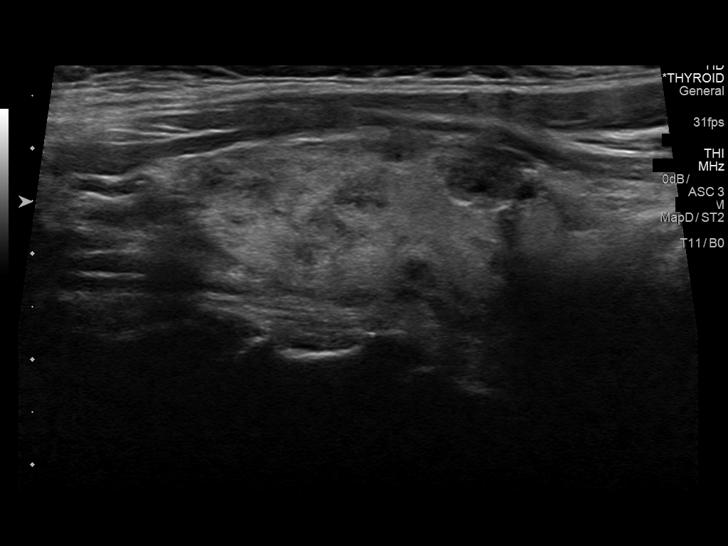
[im 17/49]
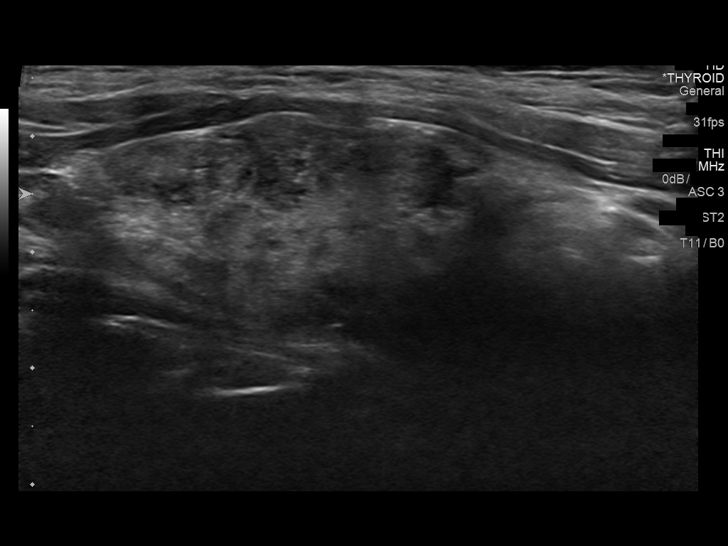
[im 19/49]
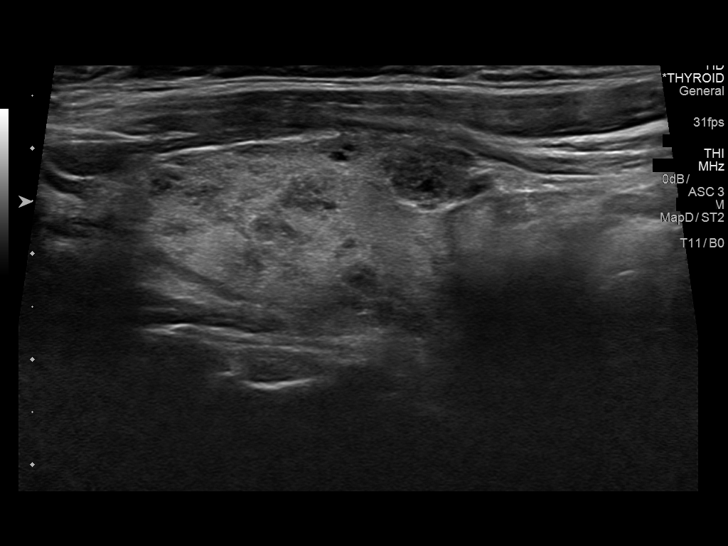
[im 23/49]
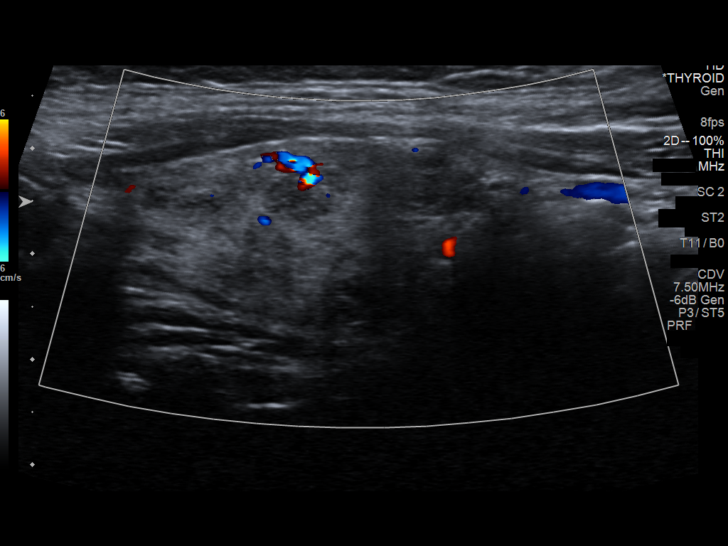
[im 27/49]
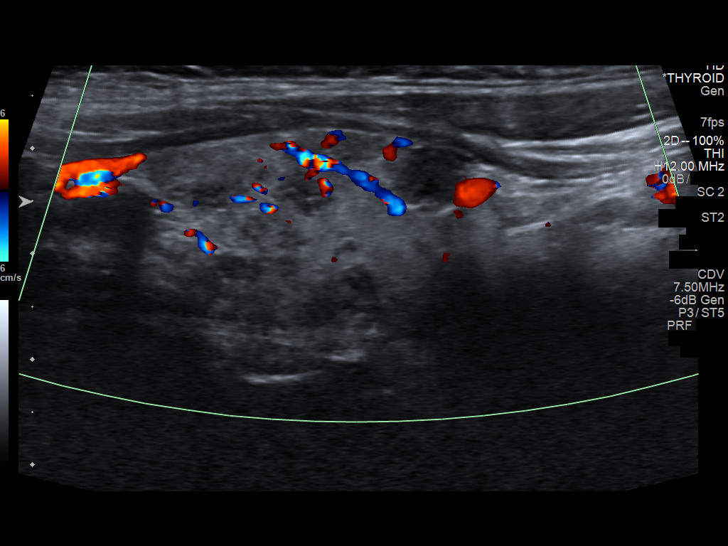
[im 31/49]
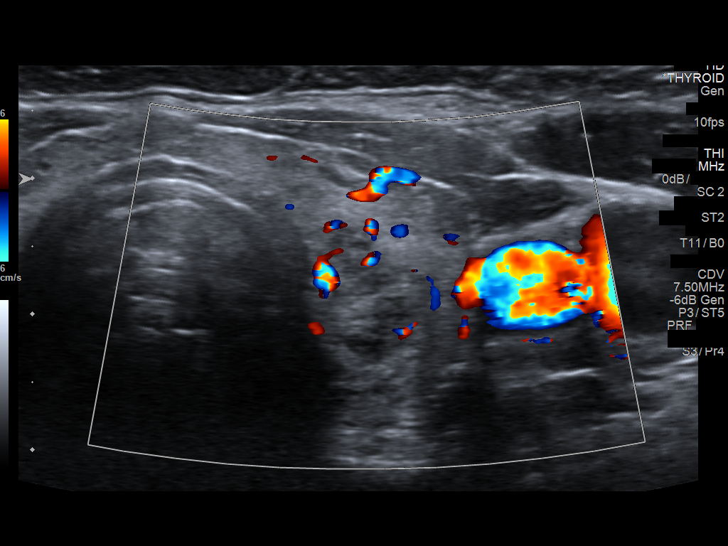
[im 33/49]
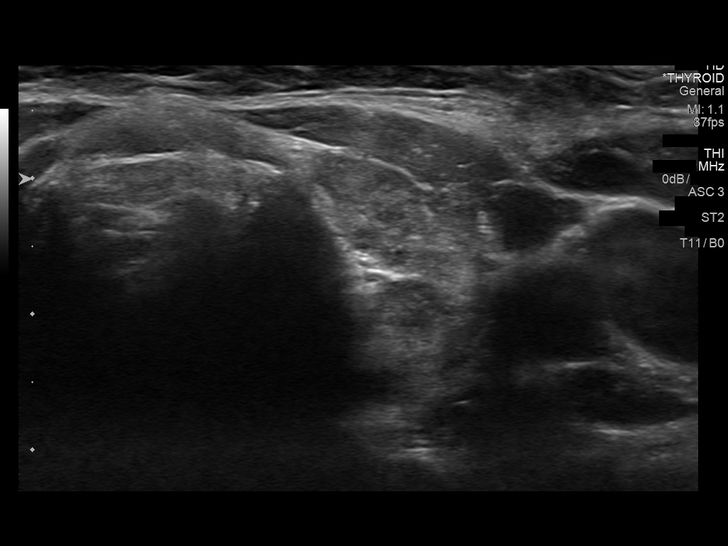
[im 37/49]
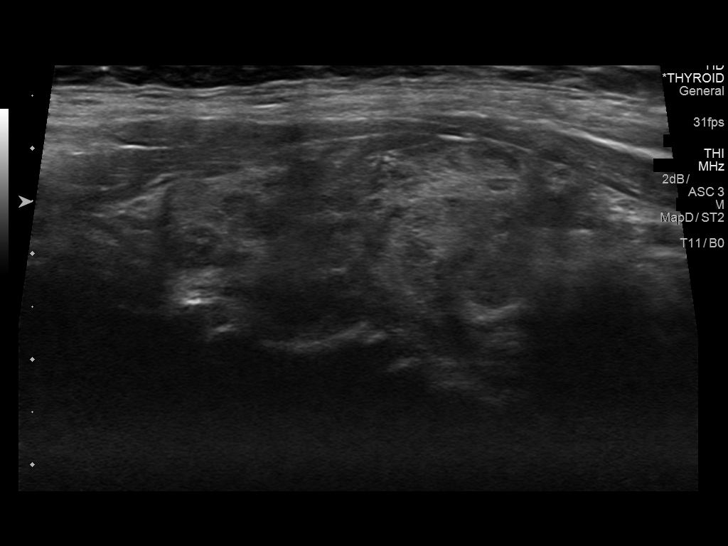
[im 41/49]
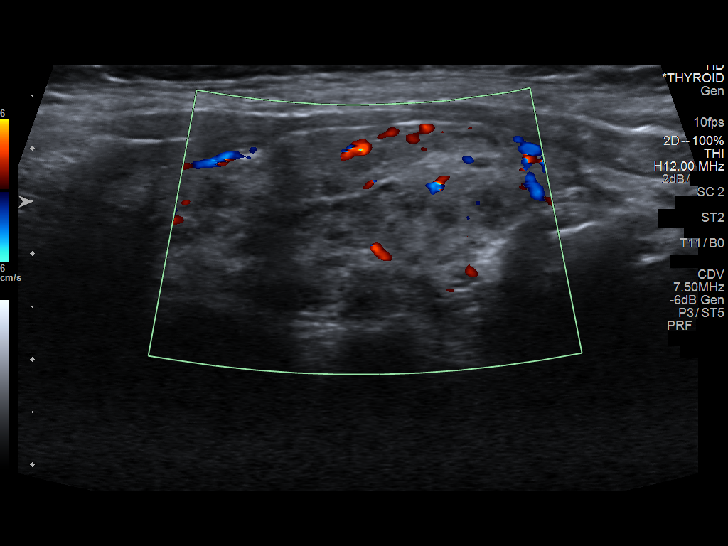
[im 45/49]
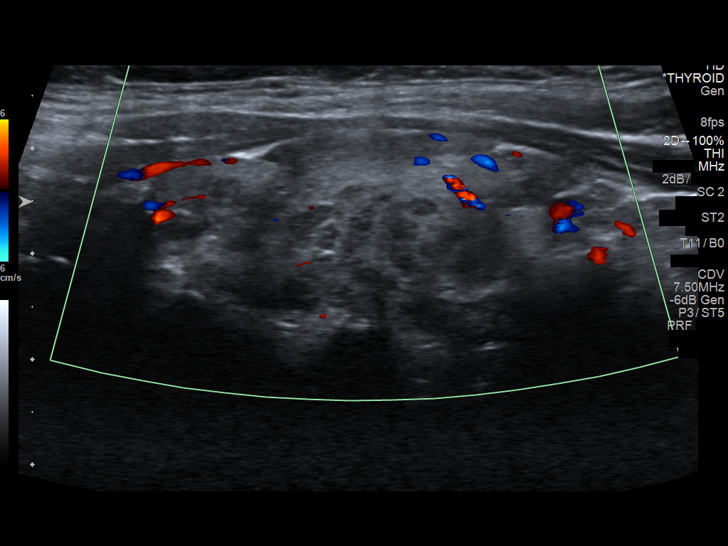
[im 49/49]
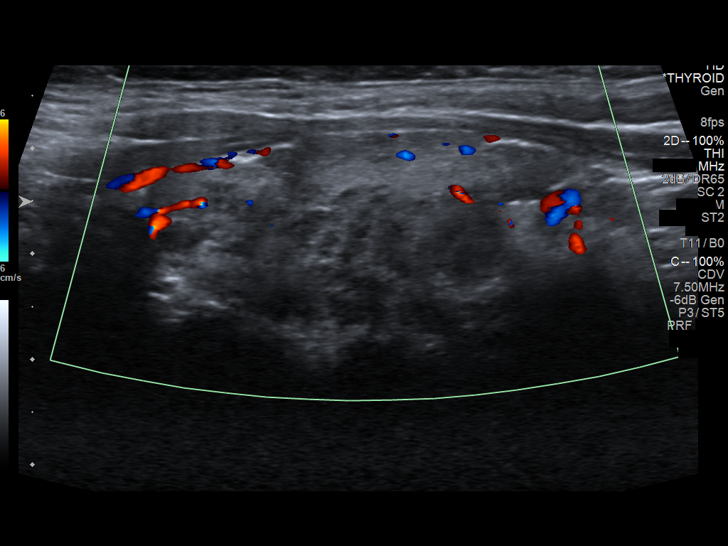

[14 of 25 positions shown; findings below may reference images not displayed]

FINDINGS: Right thyroid lobe

Measurements: 5.5 x 1.9 x 1.9 cm. Heterogeneous tissue without focal
nodule.

Left thyroid lobe

Measurements: 5.3 x 2.1 x 1.4 cm. Heterogeneous tissue without focal
nodule.

Isthmus

Thickness: 4 mm.  No nodules visualized.

Lymphadenopathy

None visualized.
IMPRESSION: Heterogeneous gland without focal nodule. The gland is mildly
enlarged.

## 2018-04-05 ENCOUNTER — Other Ambulatory Visit: Payer: Self-pay | Admitting: Family Medicine

## 2018-04-05 ENCOUNTER — Other Ambulatory Visit: Payer: Self-pay | Admitting: *Deleted

## 2018-04-05 DIAGNOSIS — E038 Other specified hypothyroidism: Secondary | ICD-10-CM

## 2018-04-05 DIAGNOSIS — E039 Hypothyroidism, unspecified: Secondary | ICD-10-CM

## 2018-04-09 ENCOUNTER — Other Ambulatory Visit: Payer: Self-pay | Admitting: Family Medicine

## 2018-04-17 IMAGING — DX DG ABDOMEN 1V
2 series · 2 of 2 positions shown · non-contrast
Comparison: None.

CLINICAL DATA: Right-sided abdominal pain for 3 weeks.

EXAM:
ABDOMEN - 1 VIEW

[abdomen kub (1 of 2)]
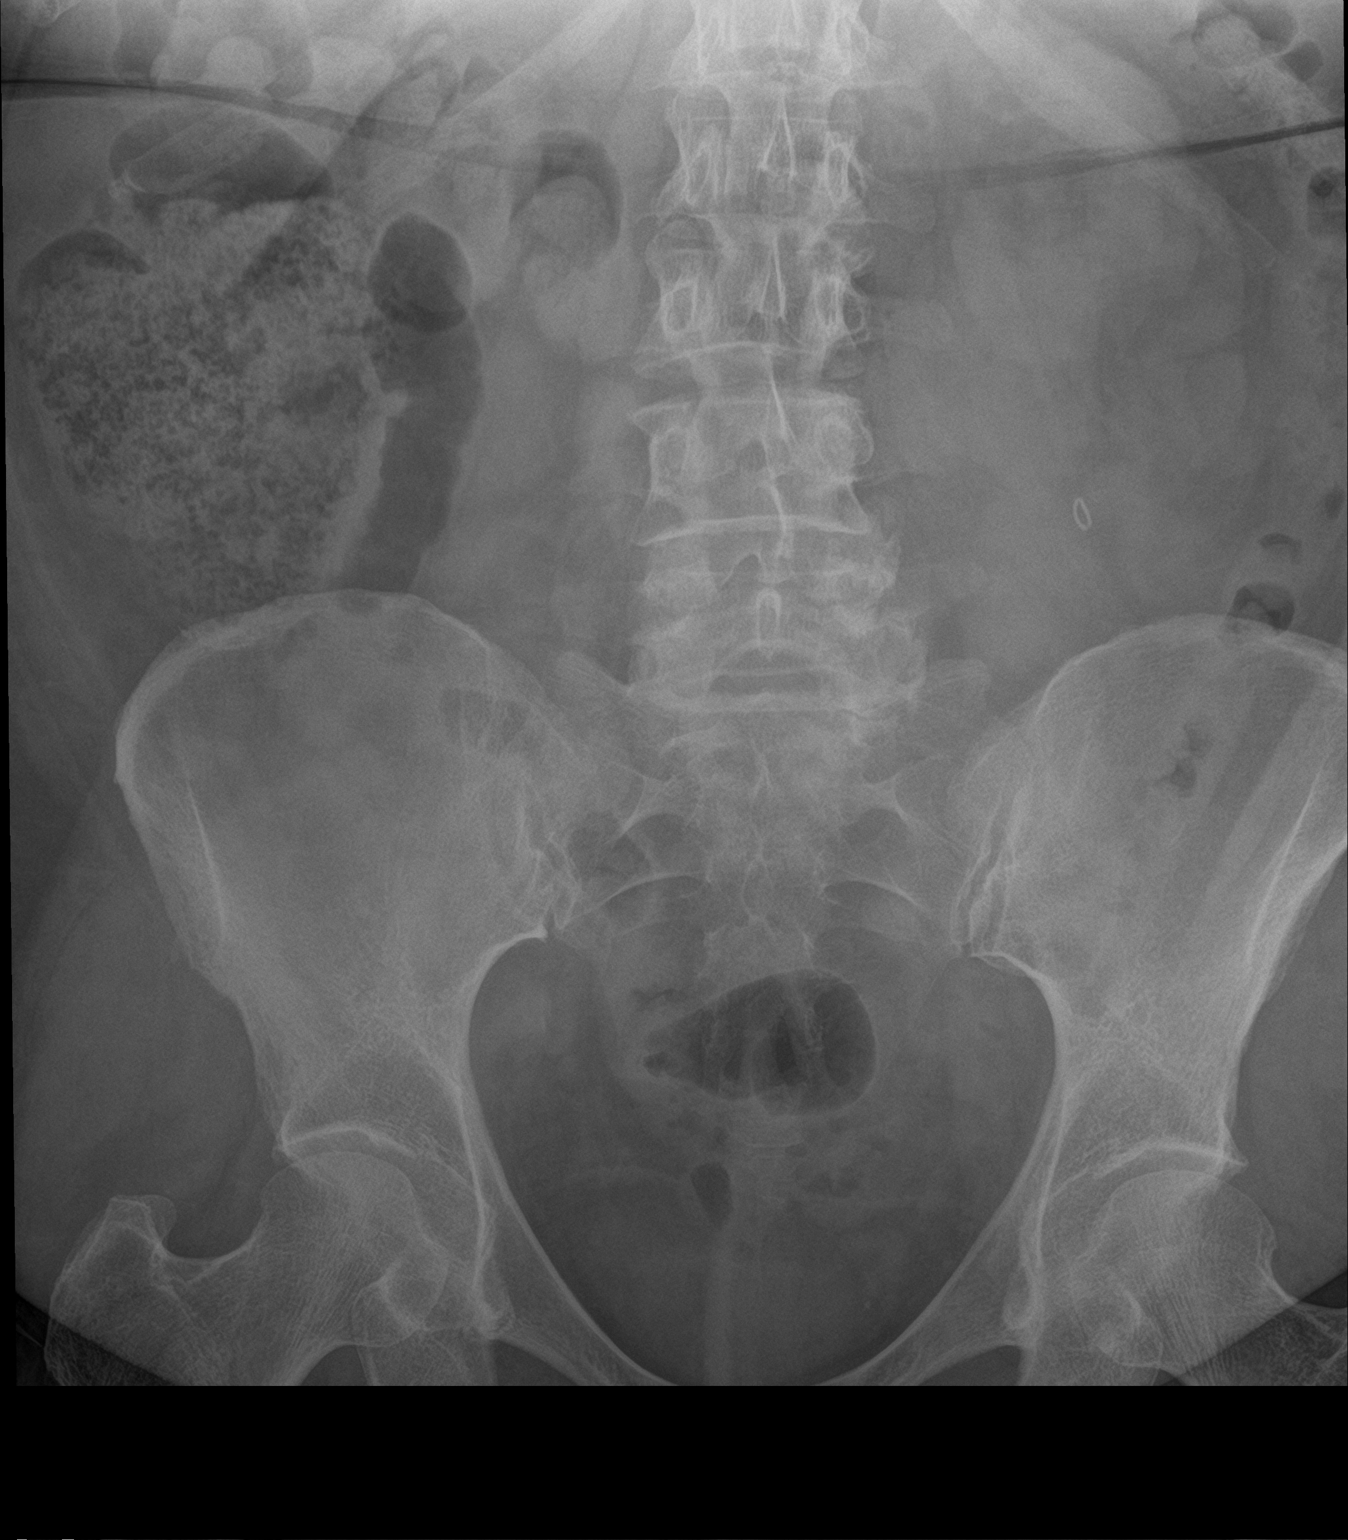

[abdomen kub (2 of 2)]
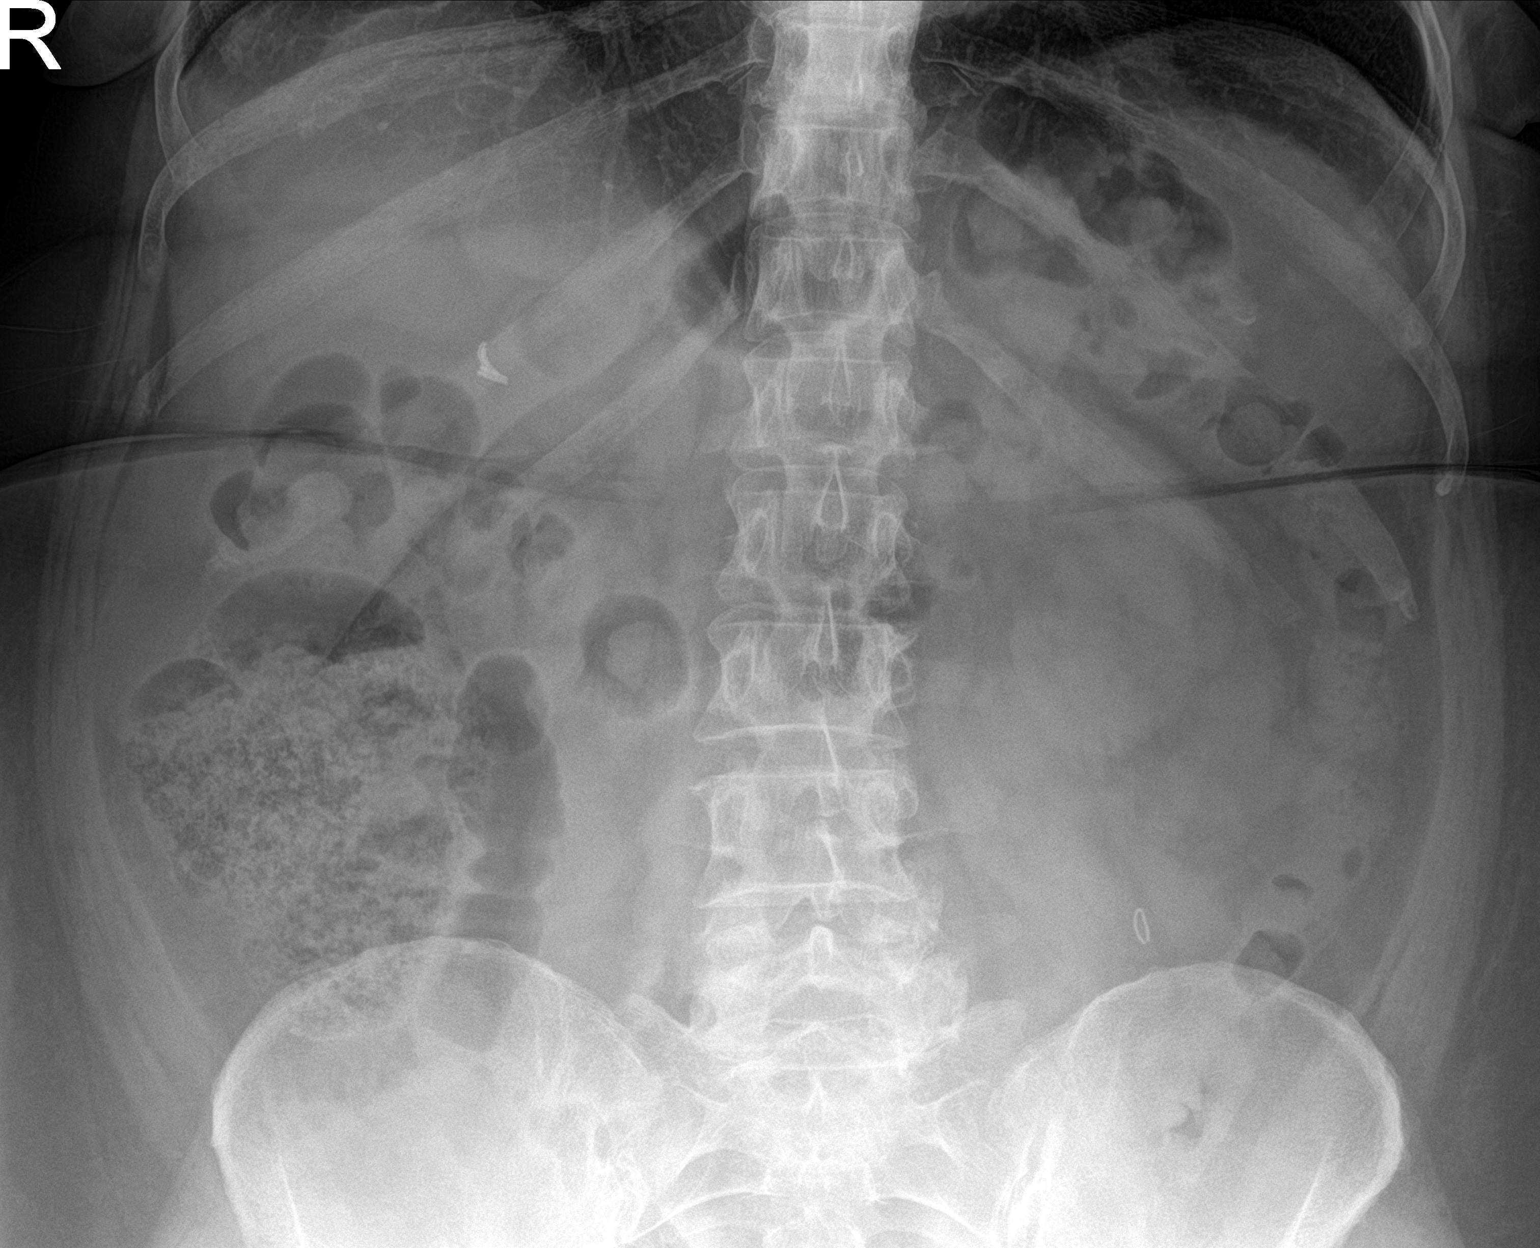

[2 of 2 positions shown; findings below may reference images not displayed]

FINDINGS: The bowel gas pattern is normal. No radio-opaque calculi or other
significant radiographic abnormality are seen. Status post
cholecystectomy. Stool is noted in right colon.
IMPRESSION: No evidence of bowel obstruction or ileus.

## 2018-04-19 DIAGNOSIS — R05 Cough: Secondary | ICD-10-CM | POA: Diagnosis not present

## 2018-04-19 DIAGNOSIS — Z859 Personal history of malignant neoplasm, unspecified: Secondary | ICD-10-CM | POA: Diagnosis not present

## 2018-04-19 DIAGNOSIS — G40909 Epilepsy, unspecified, not intractable, without status epilepticus: Secondary | ICD-10-CM | POA: Diagnosis not present

## 2018-04-19 DIAGNOSIS — R51 Headache: Secondary | ICD-10-CM | POA: Diagnosis not present

## 2018-04-19 DIAGNOSIS — R110 Nausea: Secondary | ICD-10-CM | POA: Diagnosis not present

## 2018-04-19 DIAGNOSIS — Z888 Allergy status to other drugs, medicaments and biological substances status: Secondary | ICD-10-CM | POA: Diagnosis not present

## 2018-04-19 DIAGNOSIS — Z885 Allergy status to narcotic agent status: Secondary | ICD-10-CM | POA: Diagnosis not present

## 2018-04-19 DIAGNOSIS — R002 Palpitations: Secondary | ICD-10-CM | POA: Diagnosis not present

## 2018-04-19 DIAGNOSIS — Z79899 Other long term (current) drug therapy: Secondary | ICD-10-CM | POA: Diagnosis not present

## 2018-04-19 DIAGNOSIS — F419 Anxiety disorder, unspecified: Secondary | ICD-10-CM | POA: Diagnosis not present

## 2018-04-19 DIAGNOSIS — G43809 Other migraine, not intractable, without status migrainosus: Secondary | ICD-10-CM | POA: Diagnosis not present

## 2018-04-19 DIAGNOSIS — Z880 Allergy status to penicillin: Secondary | ICD-10-CM | POA: Diagnosis not present

## 2018-04-27 DIAGNOSIS — E039 Hypothyroidism, unspecified: Secondary | ICD-10-CM | POA: Diagnosis not present

## 2018-04-28 LAB — TSH: TSH: 1.1 mIU/L (ref 0.40–4.50)

## 2018-06-05 ENCOUNTER — Ambulatory Visit: Payer: BLUE CROSS/BLUE SHIELD | Admitting: Family Medicine
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
## 2018-06-28 ENCOUNTER — Other Ambulatory Visit: Payer: Self-pay | Admitting: Family Medicine

## 2018-06-29 ENCOUNTER — Encounter: Payer: Self-pay | Admitting: Family Medicine

## 2018-06-29 ENCOUNTER — Ambulatory Visit (INDEPENDENT_AMBULATORY_CARE_PROVIDER_SITE_OTHER): Payer: BLUE CROSS/BLUE SHIELD | Admitting: Family Medicine

## 2018-06-29 VITALS — Temp 97.6°F | Ht 63.0 in | Wt 232.0 lb

## 2018-06-29 DIAGNOSIS — E038 Other specified hypothyroidism: Secondary | ICD-10-CM

## 2018-06-29 DIAGNOSIS — G40209 Localization-related (focal) (partial) symptomatic epilepsy and epileptic syndromes with complex partial seizures, not intractable, without status epilepticus: Secondary | ICD-10-CM | POA: Diagnosis not present

## 2018-06-29 DIAGNOSIS — E039 Hypothyroidism, unspecified: Secondary | ICD-10-CM

## 2018-06-29 DIAGNOSIS — G2581 Restless legs syndrome: Secondary | ICD-10-CM | POA: Diagnosis not present

## 2018-06-29 DIAGNOSIS — M797 Fibromyalgia: Secondary | ICD-10-CM

## 2018-06-29 NOTE — Progress Notes (Addendum)
Virtual Visit via Telephone Note  I connected with Margaret Beasley on 06/30/18 at  1:00 PM EDT by a telephone enabled telemedicine application and verified that I am speaking with the correct person using two identifiers.she was unable to connect via video component.     I discussed the limitations of evaluation and management by telemedicine and the availability of in person appointments. The patient expressed understanding and agreed to proceed. Pt was at home and I was in my office for the virtual visit.     Subjective:    CC: F/U fibro  HPI:  F/u fibromyalgia - I last saw her in January and we decided to restart hre flexeril at bedtime. She was also struggling with her RLS as well.  She says back in the winter and felt extremely fatigue to the point she would have to lay down.  She then developed a cough that lasted for 3 months.  She had a fever with it as well.  While sick she had a severe migraine and nausea and had to go to the ED for a visit.  In regards to the fibromyalgia he is on cymbalta and doing well.  She says she never actually tried the Flexeril because she was worried about a potential interaction with her other medications  Particular, particularly her thyroid medication  Seizure D/O  - doing well on he lamictal.   Hypothyroidism - Taking medication regularly in the AM away from food and vitamins, etc. No recent change to skin, hair, or energy levels.    Past medical history, Surgical history, Family history not pertinant except as noted below, Social history, Allergies, and medications have been entered into the medical record, reviewed, and corrections made.   Review of Systems: No fevers, chills, night sweats, weight loss, chest pain, or shortness of breath.   Objective:    General: Speaking clearly in complete sentences without any shortness of breath.  Alert and oriented x3.  Normal judgment. No apparent acute distress.    Impression and Recommendations:     Fibromyalgia - doing well on Cymbalta. She held off on the flexeril bc worried about the interaction with her thyroid medication.  Discussed that it is okay for her to try using it especially if it is at bedtime and it should not interfere with her thyroid medication which she takes first thing in the morning.  RLS - she is doing better but feels like her knee cap "crawls".  We could always consider Mirapex for treatment.  She can certainly let me know right now she wants to hold off.  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  She follows with Dr. Cammy Brochure.  Subclinical hypothyroid - She had felt much better on her thyroid medication.will recheck Thyroid later this month.  Order placed.    Time spent in non-face-to-face encounter 22 minutes.  I discussed the assessment and treatment plan with the patient. The patient was provided an opportunity to ask questions and all were answered. The patient agreed with the plan and demonstrated an understanding of the instructions.   The patient was advised to call back or seek an in-person evaluation if the symptoms worsen or if the condition fails to improve as anticipated.   Beatrice Lecher, MD

## 2018-06-30 ENCOUNTER — Encounter: Payer: Self-pay | Admitting: Family Medicine

## 2018-07-14 IMAGING — DX DG LUMBAR SPINE COMPLETE 4+V
5 series · 5 of 5 positions shown · non-contrast
Comparison: Coronal and sagittal CT images through the lumbar spine
from a scan of June 12, 2016

CLINICAL DATA: Low back pain radiating into both legs for the past
6 months with increasing symptoms on the left over the past 2 weeks
after a tripping injury.

EXAM:
LUMBAR SPINE - COMPLETE 4+ VIEW

[l-spine ap]
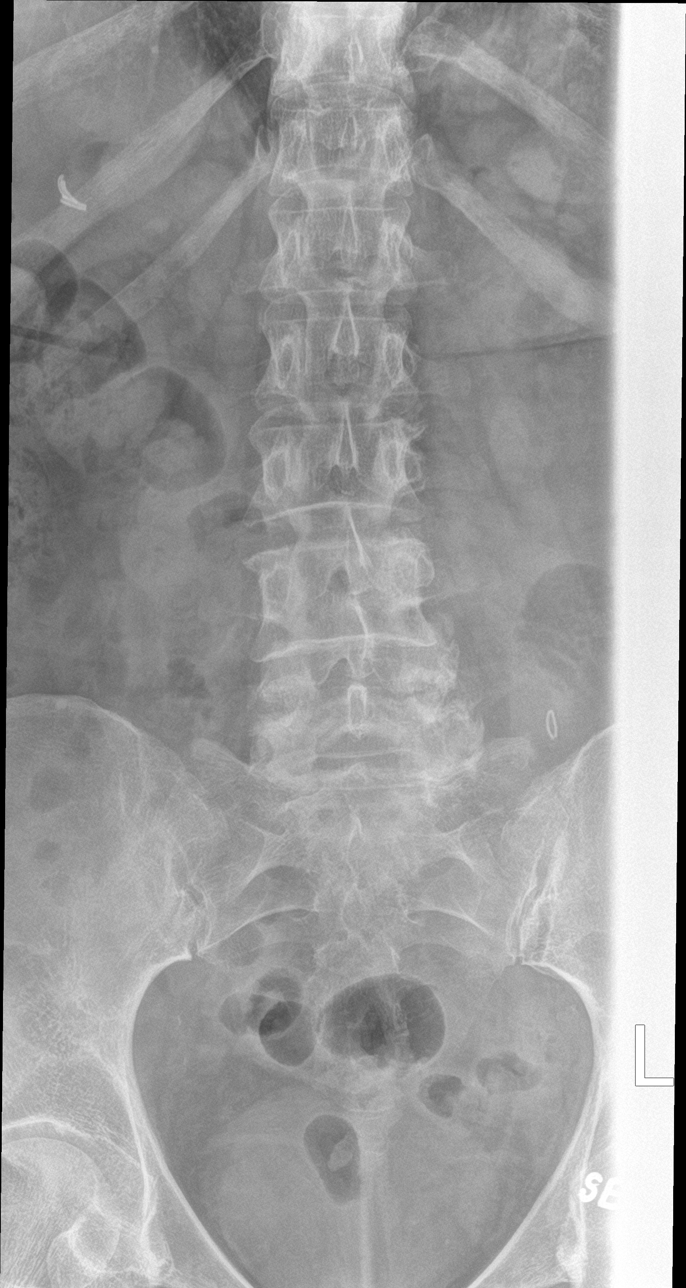

[l-spine obl (1 of 2)]
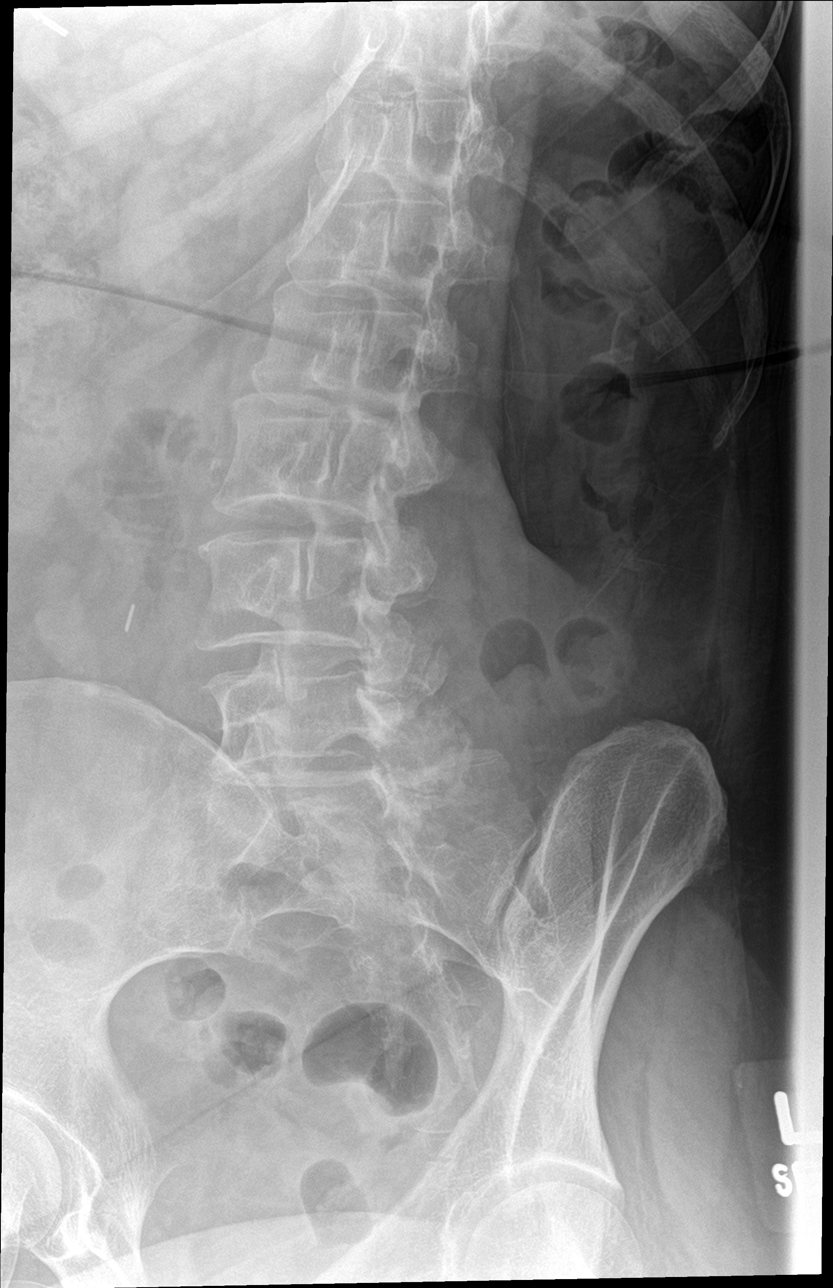

[l-spine obl (2 of 2)]
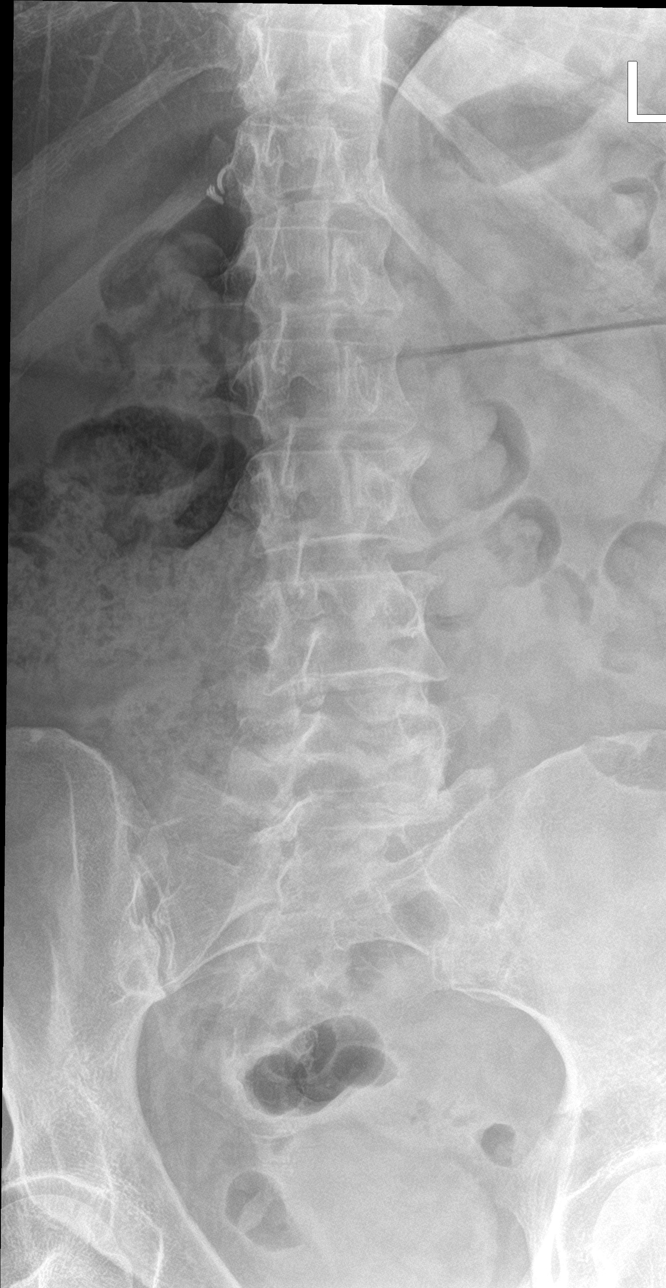

[l-spine lat]
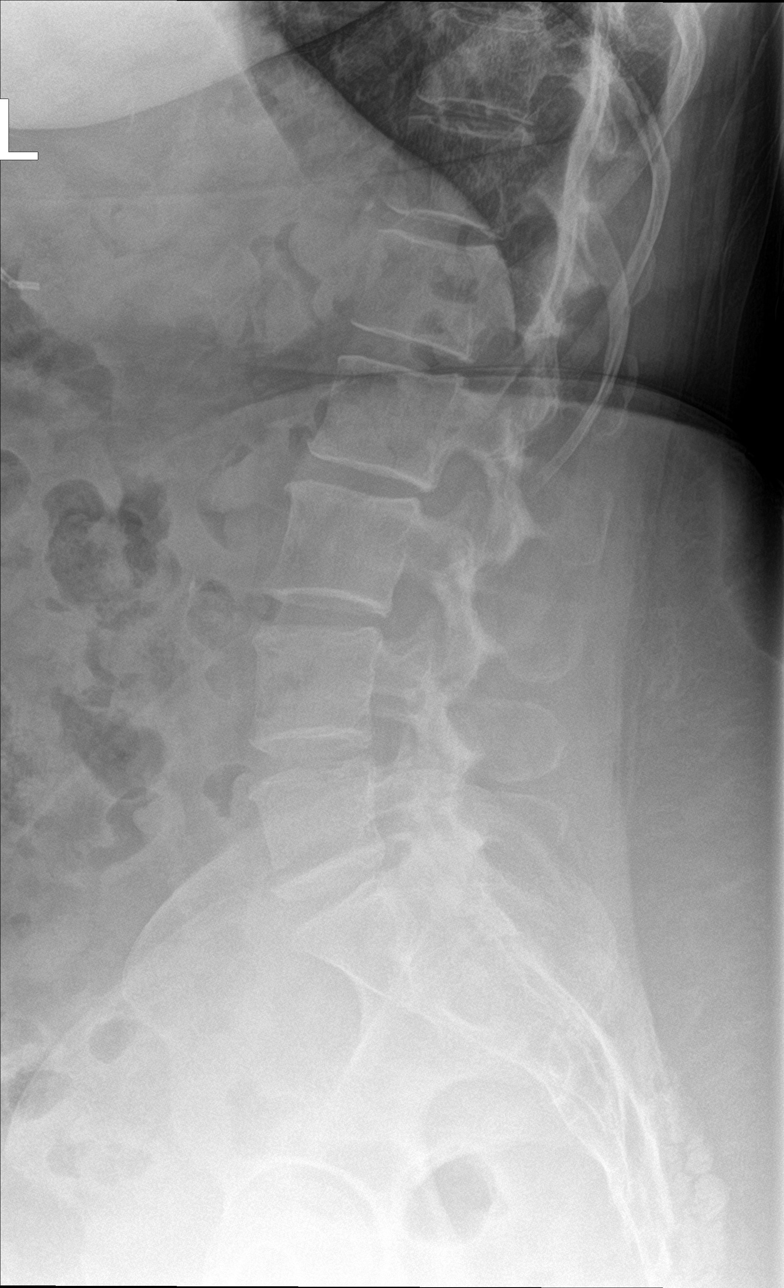

[l-spine spot]
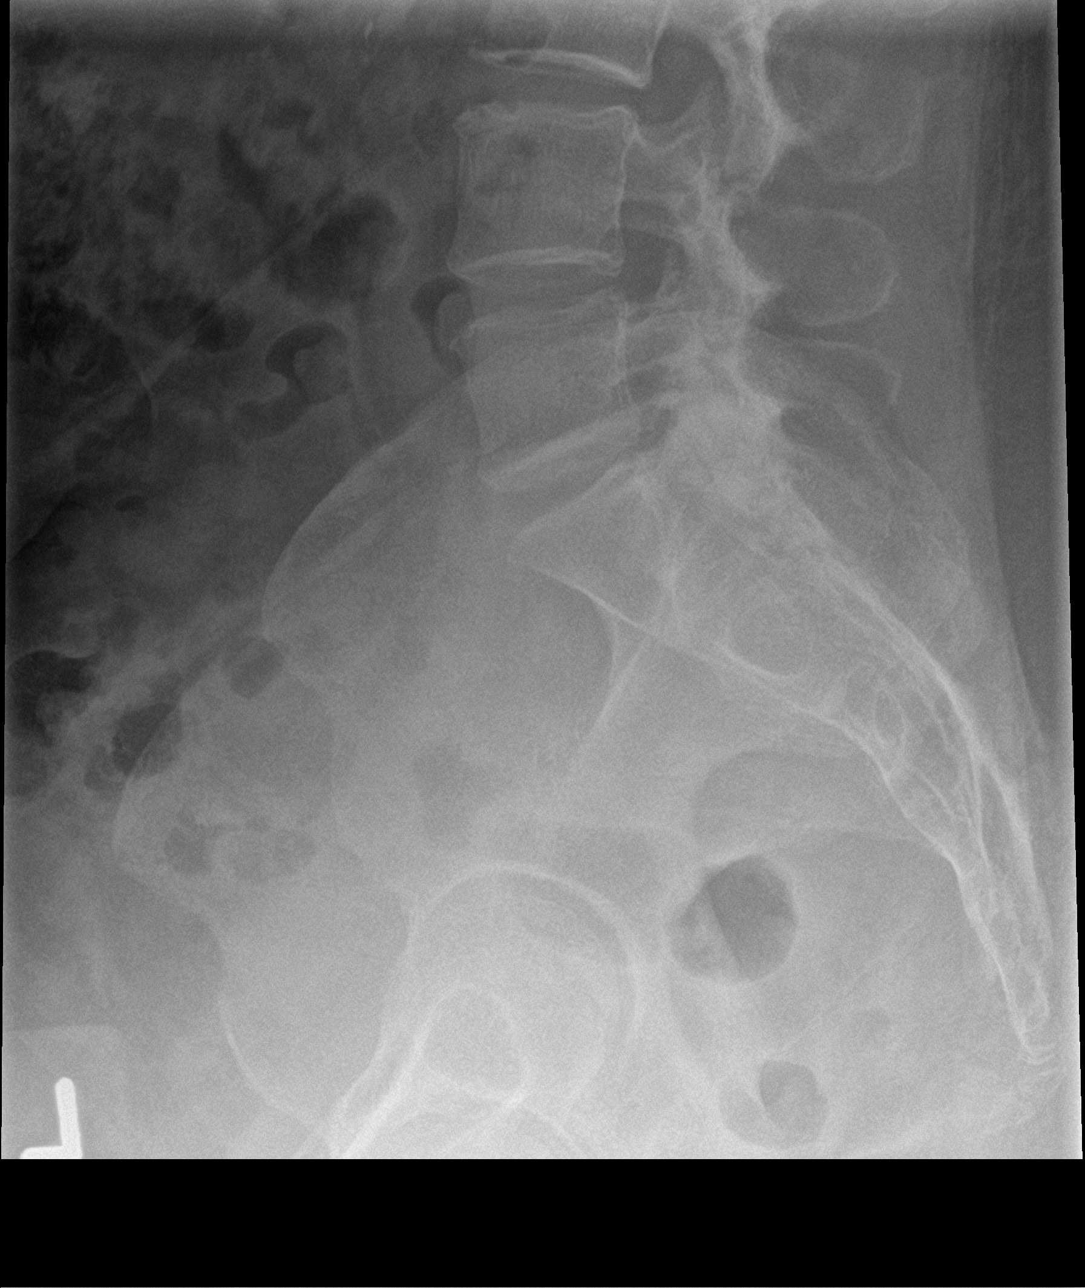

[5 of 5 positions shown; findings below may reference images not displayed]

FINDINGS: The lumbar vertebral bodies are preserved in height. The disc space
heights are reasonably well maintained though there is mild loss of
the L2-3 disc space. There is no spondylolisthesis. There is mild
facet joint hypertrophy at L5-S1. The pedicles and transverse
processes are intact. The observed portions of the sacrum are
normal.
IMPRESSION: There is no acute bony abnormality of the lumbar spine. There is
mild disc space narrowing at L2-3 and mild facet joint hypertrophy
at L5-S1.

## 2018-08-11 DIAGNOSIS — G2581 Restless legs syndrome: Secondary | ICD-10-CM | POA: Diagnosis not present

## 2018-08-11 DIAGNOSIS — E039 Hypothyroidism, unspecified: Secondary | ICD-10-CM | POA: Diagnosis not present

## 2018-08-11 DIAGNOSIS — M797 Fibromyalgia: Secondary | ICD-10-CM | POA: Diagnosis not present

## 2018-08-12 LAB — COMPLETE METABOLIC PANEL WITH GFR
AG Ratio: 1.6 (calc) (ref 1.0–2.5)
ALT: 17 U/L (ref 6–29)
AST: 21 U/L (ref 10–35)
Albumin: 4.2 g/dL (ref 3.6–5.1)
Alkaline phosphatase (APISO): 66 U/L (ref 37–153)
BUN/Creatinine Ratio: 9 (calc) (ref 6–22)
BUN: 10 mg/dL (ref 7–25)
CO2: 29 mmol/L (ref 20–32)
Calcium: 9.6 mg/dL (ref 8.6–10.4)
Chloride: 102 mmol/L (ref 98–110)
Creat: 1.07 mg/dL — ABNORMAL HIGH (ref 0.50–0.99)
GFR, Est African American: 64 mL/min/{1.73_m2} (ref >=60)
GFR, Est Non African American: 56 mL/min/{1.73_m2} — ABNORMAL LOW (ref >=60)
Globulin: 2.6 g/dL (calc) (ref 1.9–3.7)
Glucose, Bld: 93 mg/dL (ref 65–99)
Potassium: 4.4 mmol/L (ref 3.5–5.3)
Sodium: 139 mmol/L (ref 135–146)
Total Bilirubin: 0.5 mg/dL (ref 0.2–1.2)
Total Protein: 6.8 g/dL (ref 6.1–8.1)

## 2018-08-12 LAB — TSH+FREE T4: TSH W/REFLEX TO FT4: 2.16 mIU/L (ref 0.40–4.50)

## 2018-08-28 ENCOUNTER — Other Ambulatory Visit: Payer: Self-pay | Admitting: Family Medicine

## 2018-10-16 ENCOUNTER — Telehealth: Payer: Self-pay | Admitting: Family Medicine

## 2018-10-16 ENCOUNTER — Ambulatory Visit: Payer: BLUE CROSS/BLUE SHIELD | Admitting: Family Medicine

## 2018-10-16 DIAGNOSIS — G8929 Other chronic pain: Secondary | ICD-10-CM

## 2018-10-16 DIAGNOSIS — M25561 Pain in right knee: Secondary | ICD-10-CM

## 2018-10-16 NOTE — Telephone Encounter (Signed)
Referral sent - CF °

## 2018-10-16 NOTE — Telephone Encounter (Signed)
Patient called in, wanting a referral to Dr. Michaelle Birks for knee and back pain, states she has a herniated disk. Patient declined to see any of the sports meds/ortho Doctors here. Please advise.

## 2018-10-16 NOTE — Telephone Encounter (Signed)
oK TO PLACE REFERRAL.

## 2018-10-16 NOTE — Telephone Encounter (Signed)
Referral placed.

## 2018-10-16 NOTE — Telephone Encounter (Signed)
Patient advised.

## 2018-10-24 DIAGNOSIS — L910 Hypertrophic scar: Secondary | ICD-10-CM | POA: Diagnosis not present

## 2018-10-30 ENCOUNTER — Encounter: Payer: Self-pay | Admitting: Family Medicine

## 2018-10-30 ENCOUNTER — Other Ambulatory Visit: Payer: Self-pay

## 2018-10-30 ENCOUNTER — Ambulatory Visit (INDEPENDENT_AMBULATORY_CARE_PROVIDER_SITE_OTHER): Payer: BC Managed Care – PPO

## 2018-10-30 ENCOUNTER — Ambulatory Visit (INDEPENDENT_AMBULATORY_CARE_PROVIDER_SITE_OTHER): Payer: BC Managed Care – PPO | Admitting: Family Medicine

## 2018-10-30 VITALS — BP 134/91 | HR 85 | Temp 98.6°F | Ht 63.0 in | Wt 239.7 lb

## 2018-10-30 DIAGNOSIS — N644 Mastodynia: Secondary | ICD-10-CM | POA: Diagnosis not present

## 2018-10-30 DIAGNOSIS — R51 Headache: Secondary | ICD-10-CM | POA: Diagnosis not present

## 2018-10-30 DIAGNOSIS — M542 Cervicalgia: Secondary | ICD-10-CM | POA: Diagnosis not present

## 2018-10-30 DIAGNOSIS — H5500 Unspecified nystagmus: Secondary | ICD-10-CM | POA: Diagnosis not present

## 2018-10-30 DIAGNOSIS — R519 Headache, unspecified: Secondary | ICD-10-CM

## 2018-10-30 DIAGNOSIS — D0512 Intraductal carcinoma in situ of left breast: Secondary | ICD-10-CM | POA: Diagnosis not present

## 2018-10-30 DIAGNOSIS — M50323 Other cervical disc degeneration at C6-C7 level: Secondary | ICD-10-CM | POA: Diagnosis not present

## 2018-10-30 DIAGNOSIS — M50322 Other cervical disc degeneration at C5-C6 level: Secondary | ICD-10-CM | POA: Diagnosis not present

## 2018-10-30 DIAGNOSIS — M50321 Other cervical disc degeneration at C4-C5 level: Secondary | ICD-10-CM | POA: Diagnosis not present

## 2018-10-30 NOTE — Patient Instructions (Signed)
We will get x-rays of your neck done today.  Pending on what we see we will consider physical therapy for your neck.   Try to get an MRI of your brain done but this will have to be approved by your insurance first and then will call and get you scheduled.  Going to get you scheduled for a diagnostic mammogram.
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
## 2018-10-31 ENCOUNTER — Telehealth: Payer: Self-pay | Admitting: Family Medicine

## 2018-10-31 DIAGNOSIS — M542 Cervicalgia: Secondary | ICD-10-CM

## 2018-10-31 LAB — COMPLETE METABOLIC PANEL WITH GFR
AG Ratio: 1.6 (calc) (ref 1.0–2.5)
ALT: 14 U/L (ref 6–29)
AST: 19 U/L (ref 10–35)
Albumin: 4.4 g/dL (ref 3.6–5.1)
Alkaline phosphatase (APISO): 65 U/L (ref 37–153)
BUN/Creatinine Ratio: 10 (calc) (ref 6–22)
BUN: 11 mg/dL (ref 7–25)
CO2: 30 mmol/L (ref 20–32)
Calcium: 9.7 mg/dL (ref 8.6–10.4)
Chloride: 102 mmol/L (ref 98–110)
Creat: 1.06 mg/dL — ABNORMAL HIGH (ref 0.50–0.99)
GFR, Est African American: 65 mL/min/{1.73_m2} (ref >=60)
GFR, Est Non African American: 56 mL/min/{1.73_m2} — ABNORMAL LOW (ref >=60)
Globulin: 2.7 g/dL (calc) (ref 1.9–3.7)
Glucose, Bld: 91 mg/dL (ref 65–139)
Potassium: 4.9 mmol/L (ref 3.5–5.3)
Sodium: 140 mmol/L (ref 135–146)
Total Bilirubin: 0.4 mg/dL (ref 0.2–1.2)
Total Protein: 7.1 g/dL (ref 6.1–8.1)

## 2018-10-31 LAB — CBC WITH DIFFERENTIAL/PLATELET
Absolute Monocytes: 530 cells/uL (ref 200–950)
Basophils Absolute: 31 cells/uL (ref 0–200)
Basophils Relative: 0.6 %
Eosinophils Absolute: 130 cells/uL (ref 15–500)
Eosinophils Relative: 2.5 %
HCT: 45.8 % — ABNORMAL HIGH (ref 35.0–45.0)
Hemoglobin: 15 g/dL (ref 11.7–15.5)
Lymphs Abs: 1602 cells/uL (ref 850–3900)
MCH: 30.7 pg (ref 27.0–33.0)
MCHC: 32.8 g/dL (ref 32.0–36.0)
MCV: 93.9 fL (ref 80.0–100.0)
MPV: 11.1 fL (ref 7.5–12.5)
Monocytes Relative: 10.2 %
Neutro Abs: 2907 cells/uL (ref 1500–7800)
Neutrophils Relative %: 55.9 %
Platelets: 233 10*3/uL (ref 140–400)
RBC: 4.88 10*6/uL (ref 3.80–5.10)
RDW: 12.6 % (ref 11.0–15.0)
Total Lymphocyte: 30.8 %
WBC: 5.2 10*3/uL (ref 3.8–10.8)

## 2018-10-31 NOTE — Telephone Encounter (Signed)
Patient called and was wanting to be set up for Physical Therapy. Please Advise.

## 2018-10-31 NOTE — Telephone Encounter (Signed)
done

## 2018-10-31 NOTE — Telephone Encounter (Signed)
Order placed.Effa Yarrow Lynetta, CMA  

## 2018-10-31 NOTE — Telephone Encounter (Signed)
Okay to place referral for PT for neck pain

## 2018-11-06 ENCOUNTER — Other Ambulatory Visit: Payer: Self-pay

## 2018-11-06 ENCOUNTER — Ambulatory Visit (INDEPENDENT_AMBULATORY_CARE_PROVIDER_SITE_OTHER): Payer: BC Managed Care – PPO

## 2018-11-06 DIAGNOSIS — R51 Headache: Secondary | ICD-10-CM

## 2018-11-06 DIAGNOSIS — H5500 Unspecified nystagmus: Secondary | ICD-10-CM | POA: Diagnosis not present

## 2018-11-06 DIAGNOSIS — R519 Headache, unspecified: Secondary | ICD-10-CM

## 2018-11-06 MED ORDER — GADOBUTROL 1 MMOL/ML IV SOLN
10.0000 mL | Freq: Once | INTRAVENOUS | Status: AC | PRN
  Administered 2018-11-06: 10 mL via INTRAVENOUS

## 2018-11-07 ENCOUNTER — Telehealth: Payer: Self-pay

## 2018-11-07 ENCOUNTER — Ambulatory Visit
Admission: RE | Admit: 2018-11-07 | Discharge: 2018-11-07 | Disposition: A | Discharge status: Home / Self Care | Payer: Medicare Other | Source: Ambulatory Visit | Attending: Family Medicine | Admitting: Family Medicine

## 2018-11-07 DIAGNOSIS — N6489 Other specified disorders of breast: Secondary | ICD-10-CM | POA: Diagnosis not present

## 2018-11-07 DIAGNOSIS — R928 Other abnormal and inconclusive findings on diagnostic imaging of breast: Secondary | ICD-10-CM | POA: Diagnosis not present

## 2018-11-07 DIAGNOSIS — N644 Mastodynia: Secondary | ICD-10-CM

## 2018-11-07 NOTE — Telephone Encounter (Signed)
Margaret Beasley called about MRI results.

## 2018-11-07 NOTE — Telephone Encounter (Signed)
Contacted pt - she is aware that provider is still in the process of reviewing MRI results. Aware that she may get results call tomorrow. No other inquiries during call.

## 2018-11-07 NOTE — Telephone Encounter (Signed)
I haven' gotten to this one in my list. I should have time to look at it this afternoon.

## 2018-11-09 ENCOUNTER — Ambulatory Visit: Payer: BC Managed Care – PPO | Admitting: Rehabilitative and Restorative Service Providers"

## 2018-11-13 ENCOUNTER — Encounter: Payer: Self-pay | Admitting: Family Medicine

## 2018-11-13 ENCOUNTER — Ambulatory Visit (INDEPENDENT_AMBULATORY_CARE_PROVIDER_SITE_OTHER): Payer: BC Managed Care – PPO | Admitting: Family Medicine

## 2018-11-13 ENCOUNTER — Telehealth: Payer: Self-pay | Admitting: Family Medicine

## 2018-11-13 ENCOUNTER — Other Ambulatory Visit: Payer: Self-pay

## 2018-11-13 VITALS — BP 133/88 | HR 84 | Ht 63.0 in | Wt 242.0 lb

## 2018-11-13 DIAGNOSIS — G3184 Mild cognitive impairment, so stated: Secondary | ICD-10-CM

## 2018-11-13 DIAGNOSIS — M542 Cervicalgia: Secondary | ICD-10-CM | POA: Diagnosis not present

## 2018-11-13 DIAGNOSIS — G40209 Localization-related (focal) (partial) symptomatic epilepsy and epileptic syndromes with complex partial seizures, not intractable, without status epilepticus: Secondary | ICD-10-CM

## 2018-11-13 DIAGNOSIS — G40909 Epilepsy, unspecified, not intractable, without status epilepticus: Secondary | ICD-10-CM

## 2018-11-13 DIAGNOSIS — R413 Other amnesia: Secondary | ICD-10-CM | POA: Diagnosis not present

## 2018-11-13 DIAGNOSIS — M5412 Radiculopathy, cervical region: Secondary | ICD-10-CM | POA: Diagnosis not present

## 2018-11-13 DIAGNOSIS — N644 Mastodynia: Secondary | ICD-10-CM | POA: Diagnosis not present

## 2018-11-13 NOTE — Telephone Encounter (Signed)
Dr. Lynnette Caffey is her regular neurologist.  Dr. Earley Brooke is the neuropsychologist and that is who she would need to contact.  If we need to schedule a new referral we can place 1 if needed.

## 2018-11-13 NOTE — Telephone Encounter (Signed)
This call patient: I was able to find her neuropsychiatric evaluation it was done in March 2019 so it is been right at a year and a half.  In the notes it made it sound like he wanted to see her back to go over the results in detail but I cannot tell if there was actually a follow-up visit done or not.  It did show some mild to moderate impairment in her short-term and long-term memory from what I could read in the notes.  I do think it might be worth getting back in for repeat evaluation to see if they feel like things are progressing.  I did not see any type of definitive diagnosis in the notes.

## 2018-11-13 NOTE — Assessment & Plan Note (Signed)
Get her referred back to neurology to make sure that her medications are being appropriately monitored and prescribed.

## 2018-11-13 NOTE — Assessment & Plan Note (Signed)
Do feel like she has symptoms consistent with mild cognitive impairment.  I was able to find her neuropsychiatric evaluation from March 2019 so spent about a year and a half.  It looks like in the notes she was originally scheduled to follow-up and go over the results but I cannot tell that that actually happened and patient says she really only remembers the evaluation itself.  Sounds like it was fairly unrevealing at least at that point though her memory was definitely showing some mild to moderate impairment at that time.  Think it may be worth trying to get her back in for reevaluation to see if her symptoms may actually be worsening as she feels that it is.
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
## 2018-11-13 NOTE — Telephone Encounter (Signed)
Upon review from Care Everywhere:  Pt was seen at [facility] (Dr Lynnette Caffey) for memory eval on 04/25/17. Follow up on 06/08/17. Imaging on 06/13/17. She then went back to this office in Sept 2019 for seizure disorder.   Would you like her to contact that office again?

## 2018-11-14 NOTE — Telephone Encounter (Signed)
Pt advised. She is going to think on if she needs to make a follow up with Dr Dionne Milo and will let us know.

## 2018-11-15 NOTE — Telephone Encounter (Signed)
New referral signed.

## 2018-11-15 NOTE — Addendum Note (Signed)
Addended by: Towana Badger on: 11/15/2018 03:04 PM   Modules accepted: Orders

## 2018-11-15 NOTE — Telephone Encounter (Signed)
Patient called stating she called Dr Acree's office to get scheduled and they advised her that they do not have any openings until April 2021.  Called [facility] and spoke with [place]. She states that Dr Melvyn Novas is a new provider and has openings second week of December.   I have pended the referral, please review and sign if ok.

## 2018-11-15 NOTE — Addendum Note (Signed)
Addended by: Beatrice Lecher D on: 11/15/2018 03:18 PM   Modules accepted: Orders

## 2018-11-16 DIAGNOSIS — M5412 Radiculopathy, cervical region: Secondary | ICD-10-CM | POA: Diagnosis not present

## 2018-11-16 DIAGNOSIS — M542 Cervicalgia: Secondary | ICD-10-CM | POA: Diagnosis not present

## 2018-11-16 DIAGNOSIS — G40909 Epilepsy, unspecified, not intractable, without status epilepticus: Secondary | ICD-10-CM | POA: Diagnosis not present

## 2018-11-16 DIAGNOSIS — R413 Other amnesia: Secondary | ICD-10-CM | POA: Diagnosis not present

## 2018-11-22 DIAGNOSIS — M542 Cervicalgia: Secondary | ICD-10-CM | POA: Diagnosis not present

## 2018-11-22 DIAGNOSIS — M545 Low back pain: Secondary | ICD-10-CM | POA: Diagnosis not present

## 2018-11-22 DIAGNOSIS — M5412 Radiculopathy, cervical region: Secondary | ICD-10-CM | POA: Diagnosis not present

## 2018-11-23 DIAGNOSIS — M545 Low back pain: Secondary | ICD-10-CM | POA: Diagnosis not present

## 2018-11-23 DIAGNOSIS — M5412 Radiculopathy, cervical region: Secondary | ICD-10-CM | POA: Diagnosis not present

## 2018-11-23 DIAGNOSIS — M542 Cervicalgia: Secondary | ICD-10-CM | POA: Diagnosis not present

## 2018-11-27 DIAGNOSIS — M545 Low back pain: Secondary | ICD-10-CM | POA: Diagnosis not present

## 2018-11-27 DIAGNOSIS — M542 Cervicalgia: Secondary | ICD-10-CM | POA: Diagnosis not present

## 2018-11-27 DIAGNOSIS — M5412 Radiculopathy, cervical region: Secondary | ICD-10-CM | POA: Diagnosis not present

## 2018-12-01 DIAGNOSIS — M1711 Unilateral primary osteoarthritis, right knee: Secondary | ICD-10-CM | POA: Diagnosis not present

## 2018-12-01 DIAGNOSIS — Z6841 Body Mass Index (BMI) 40.0 and over, adult: Secondary | ICD-10-CM | POA: Diagnosis not present

## 2018-12-05 ENCOUNTER — Institutional Professional Consult (permissible substitution): Payer: BC Managed Care – PPO | Admitting: Plastic Surgery

## 2018-12-20 DIAGNOSIS — M1711 Unilateral primary osteoarthritis, right knee: Secondary | ICD-10-CM | POA: Diagnosis not present

## 2018-12-25 ENCOUNTER — Other Ambulatory Visit: Payer: Self-pay | Admitting: Family Medicine

## 2018-12-25 DIAGNOSIS — E038 Other specified hypothyroidism: Secondary | ICD-10-CM

## 2018-12-25 DIAGNOSIS — E039 Hypothyroidism, unspecified: Secondary | ICD-10-CM

## 2018-12-27 DIAGNOSIS — M1711 Unilateral primary osteoarthritis, right knee: Secondary | ICD-10-CM | POA: Diagnosis not present

## 2019-01-03 DIAGNOSIS — M1711 Unilateral primary osteoarthritis, right knee: Secondary | ICD-10-CM | POA: Diagnosis not present

## 2019-01-16 ENCOUNTER — Telehealth: Payer: Self-pay

## 2019-01-16 DIAGNOSIS — M2550 Pain in unspecified joint: Secondary | ICD-10-CM

## 2019-01-16 NOTE — Telephone Encounter (Signed)
Okay to place referral per patient preference.  Okay to use joint pain.

## 2019-01-16 NOTE — Telephone Encounter (Signed)
Referral placed. Patient aware.  

## 2019-01-16 NOTE — Telephone Encounter (Signed)
Margaret Beasley called wanting a referral to Rheumatology. She states she has hand and feet pain, joint pain and thigh burning. Please advise.

## 2019-01-22 ENCOUNTER — Encounter: Payer: Self-pay | Admitting: Psychology

## 2019-01-23 ENCOUNTER — Encounter: Payer: BC Managed Care – PPO | Admitting: Psychology

## 2019-01-30 ENCOUNTER — Encounter: Payer: BC Managed Care – PPO | Admitting: Psychology

## 2019-02-13 ENCOUNTER — Ambulatory Visit (INDEPENDENT_AMBULATORY_CARE_PROVIDER_SITE_OTHER): Payer: BC Managed Care – PPO | Admitting: Family Medicine

## 2019-02-13 ENCOUNTER — Encounter: Payer: Self-pay | Admitting: Family Medicine

## 2019-02-13 DIAGNOSIS — R0789 Other chest pain: Secondary | ICD-10-CM

## 2019-02-13 MED ORDER — CYCLOBENZAPRINE HCL 5 MG PO TABS: ORAL_TABLET | ORAL | 0 refills | Status: DC

## 2019-02-13 NOTE — Progress Notes (Addendum)
Virtual Visit via Telephone Note  I connected with Margaret Beasley on 02/13/19 at  3:00 PM EST by telephone and verified that I am speaking with the correct person using two identifiers.   I discussed the limitations, risks, security and privacy concerns of performing an evaluation and management service by telephone and the availability of in person appointments. I also discussed with the patient that there may be a patient responsible charge related to this service. The patient expressed understanding and agreed to proceed.   Subjective:    CC: Left sided chest pain.    HPI: 63 year old female virtual telephone visit today for left-sided chest pain.  She has had some intermittent left-sided chest pain on and off for probably over a year in fact she had mentioned that at a previous office visit in January 2020.  At the time she was actually having some problems with her hiatal hernia and was following up with GI.  Her initial evaluation which just included EKG and labs was actually normal.  She called today because more recently a few days ago she had some left-sided chest pain that was a little bit more medial than what she was previously experiencing was little closer to the breastbone.  She says it lasted about 2-1/2 hours.  And it was in a very discrete quarter to $0.50 piece size location.  She described it as a sharp recurring sensation almost a burning or stinging.  She has noticed a vein in that area on her breast and was concerned.  She does have a prior history of breast cancer.  Last mammogram was in September.  She said she noticed recurrence of the pain yesterday and even last night but it did not last quite as long.  She notices if she pushes more firmly on the area it is sore and she notices if she takes a deep breath in through her nose she will feel just a little bit of discomfort there as well.  He has not tried any heat or ice.  She does feel like is a little bit worse if she is  laying back in her recliner.  Rates her pain as a 4 out of 10.  He was concerned about the possibility of it being her heart.  No shortness of breath or diaphoresis.  Past medical history, Surgical history, Family history not pertinant except as noted below, Social history, Allergies, and medications have been entered into the medical record, reviewed, and corrections made.   Review of Systems: No fevers, chills, night sweats, weight loss, chest pain, or shortness of breath.   Objective:    General: Speaking clearly in complete sentences without any shortness of breath.  Alert and oriented x3.  Normal judgment. No apparent acute distress.    Impression and Recommendations:    Atypical chest pain- discussed possible diagnoses. Being her pain is worse with position and is localized to a quarter sized area along the edge of th breast bone, it is most consistent with costochondritis. Discussed dx and recommend IBU/Aleve, heat or ice and avoid heaving lifting. Will likely take a couple of weeks to improve. Doesn't sound cardiac in nature. Call if any new sxs or not resolving. Will try muscle relaxer as well.     I discussed the assessment and treatment plan with the patient. The patient was provided an opportunity to ask questions and all were answered. The patient agreed with the plan and demonstrated an understanding of the instructions.   The  patient was advised to call back or seek an in-person evaluation if the symptoms worsen or if the condition fails to improve as anticipated.  I provided 20 minutes of non-face-to-face time during this encounter.   Beatrice Lecher, MD

## 2019-02-19 ENCOUNTER — Other Ambulatory Visit: Payer: Self-pay | Admitting: Family Medicine

## 2019-02-19 DIAGNOSIS — M797 Fibromyalgia: Secondary | ICD-10-CM | POA: Diagnosis not present

## 2019-02-19 DIAGNOSIS — E559 Vitamin D deficiency, unspecified: Secondary | ICD-10-CM | POA: Diagnosis not present

## 2019-02-19 DIAGNOSIS — M8949 Other hypertrophic osteoarthropathy, multiple sites: Secondary | ICD-10-CM | POA: Diagnosis not present

## 2019-02-19 DIAGNOSIS — M3500 Sicca syndrome, unspecified: Secondary | ICD-10-CM | POA: Diagnosis not present

## 2019-02-19 DIAGNOSIS — M5030 Other cervical disc degeneration, unspecified cervical region: Secondary | ICD-10-CM | POA: Diagnosis not present

## 2019-02-19 MED ORDER — SUMATRIPTAN SUCCINATE 100 MG PO TABS: ORAL_TABLET | ORAL | 1 refills | Status: DC

## 2019-02-19 NOTE — Telephone Encounter (Signed)
Patient needed a refill on her Imitrix. No other questions.

## 2019-03-06 ENCOUNTER — Other Ambulatory Visit: Payer: Self-pay | Admitting: Family Medicine

## 2019-03-12 ENCOUNTER — Other Ambulatory Visit: Payer: Self-pay | Admitting: Family Medicine

## 2019-03-22 DIAGNOSIS — Z79899 Other long term (current) drug therapy: Secondary | ICD-10-CM | POA: Diagnosis not present

## 2019-03-22 DIAGNOSIS — M797 Fibromyalgia: Secondary | ICD-10-CM | POA: Diagnosis not present

## 2019-03-22 DIAGNOSIS — M47816 Spondylosis without myelopathy or radiculopathy, lumbar region: Secondary | ICD-10-CM | POA: Diagnosis not present

## 2019-03-22 DIAGNOSIS — Z5181 Encounter for therapeutic drug level monitoring: Secondary | ICD-10-CM | POA: Diagnosis not present

## 2019-04-19 ENCOUNTER — Other Ambulatory Visit: Payer: Self-pay

## 2019-04-19 ENCOUNTER — Encounter: Payer: Self-pay | Admitting: Family Medicine

## 2019-04-19 ENCOUNTER — Ambulatory Visit (INDEPENDENT_AMBULATORY_CARE_PROVIDER_SITE_OTHER): Payer: 59 | Admitting: Family Medicine

## 2019-04-19 VITALS — BP 140/83 | HR 90 | Ht 63.0 in | Wt 238.0 lb

## 2019-04-19 DIAGNOSIS — G894 Chronic pain syndrome: Secondary | ICD-10-CM | POA: Diagnosis not present

## 2019-04-19 DIAGNOSIS — R5383 Other fatigue: Secondary | ICD-10-CM

## 2019-04-19 DIAGNOSIS — M545 Low back pain, unspecified: Secondary | ICD-10-CM

## 2019-04-19 DIAGNOSIS — R531 Weakness: Secondary | ICD-10-CM | POA: Diagnosis not present

## 2019-04-19 DIAGNOSIS — M797 Fibromyalgia: Secondary | ICD-10-CM | POA: Diagnosis not present

## 2019-04-19 DIAGNOSIS — M79672 Pain in left foot: Secondary | ICD-10-CM

## 2019-04-19 DIAGNOSIS — M79605 Pain in left leg: Secondary | ICD-10-CM | POA: Diagnosis not present

## 2019-04-19 DIAGNOSIS — M79604 Pain in right leg: Secondary | ICD-10-CM

## 2019-04-19 DIAGNOSIS — R202 Paresthesia of skin: Secondary | ICD-10-CM | POA: Diagnosis not present

## 2019-04-19 DIAGNOSIS — M79671 Pain in right foot: Secondary | ICD-10-CM | POA: Diagnosis not present

## 2019-04-19 DIAGNOSIS — M47816 Spondylosis without myelopathy or radiculopathy, lumbar region: Secondary | ICD-10-CM | POA: Diagnosis not present

## 2019-04-19 LAB — FERRITIN: Ferritin: 30 ng/mL (ref 16–288)

## 2019-04-19 LAB — TSH+FREE T4: TSH W/REFLEX TO FT4: 2.22 mIU/L (ref 0.40–4.50)

## 2019-04-19 LAB — B12 AND FOLATE PANEL
Folate: 14.3 ng/mL
Vitamin B-12: 325 pg/mL (ref 200–1100)

## 2019-04-19 LAB — MAGNESIUM: Magnesium: 2.2 mg/dL (ref 1.5–2.5)
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
## 2019-05-15 DIAGNOSIS — G894 Chronic pain syndrome: Secondary | ICD-10-CM | POA: Diagnosis not present

## 2019-05-15 DIAGNOSIS — M2550 Pain in unspecified joint: Secondary | ICD-10-CM | POA: Diagnosis not present

## 2019-05-15 DIAGNOSIS — M797 Fibromyalgia: Secondary | ICD-10-CM | POA: Diagnosis not present

## 2019-05-15 DIAGNOSIS — M5030 Other cervical disc degeneration, unspecified cervical region: Secondary | ICD-10-CM | POA: Diagnosis not present

## 2019-05-15 DIAGNOSIS — M47816 Spondylosis without myelopathy or radiculopathy, lumbar region: Secondary | ICD-10-CM | POA: Diagnosis not present

## 2019-05-15 DIAGNOSIS — M5136 Other intervertebral disc degeneration, lumbar region: Secondary | ICD-10-CM | POA: Diagnosis not present

## 2019-05-15 DIAGNOSIS — M545 Low back pain: Secondary | ICD-10-CM | POA: Diagnosis not present

## 2019-05-17 ENCOUNTER — Other Ambulatory Visit: Payer: Self-pay

## 2019-05-17 ENCOUNTER — Encounter: Payer: Self-pay | Admitting: Family Medicine

## 2019-05-17 ENCOUNTER — Ambulatory Visit (INDEPENDENT_AMBULATORY_CARE_PROVIDER_SITE_OTHER): Payer: 59 | Admitting: Family Medicine

## 2019-05-17 VITALS — BP 131/83 | HR 84 | Ht 63.0 in | Wt 242.0 lb

## 2019-05-17 DIAGNOSIS — M47816 Spondylosis without myelopathy or radiculopathy, lumbar region: Secondary | ICD-10-CM | POA: Diagnosis not present

## 2019-05-17 DIAGNOSIS — M5136 Other intervertebral disc degeneration, lumbar region: Secondary | ICD-10-CM | POA: Diagnosis not present

## 2019-05-17 DIAGNOSIS — M79672 Pain in left foot: Secondary | ICD-10-CM

## 2019-05-17 DIAGNOSIS — M79671 Pain in right foot: Secondary | ICD-10-CM

## 2019-05-17 DIAGNOSIS — M797 Fibromyalgia: Secondary | ICD-10-CM | POA: Diagnosis not present

## 2019-05-17 DIAGNOSIS — G894 Chronic pain syndrome: Secondary | ICD-10-CM | POA: Diagnosis not present

## 2019-05-17 MED ORDER — DULOXETINE HCL 30 MG PO CPEP: ORAL_CAPSULE | ORAL | 2 refills | Status: DC
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
## 2019-05-21 DIAGNOSIS — M8949 Other hypertrophic osteoarthropathy, multiple sites: Secondary | ICD-10-CM | POA: Diagnosis not present

## 2019-05-21 DIAGNOSIS — M797 Fibromyalgia: Secondary | ICD-10-CM | POA: Diagnosis not present

## 2019-05-21 DIAGNOSIS — M3500 Sicca syndrome, unspecified: Secondary | ICD-10-CM | POA: Diagnosis not present

## 2019-05-21 DIAGNOSIS — M5030 Other cervical disc degeneration, unspecified cervical region: Secondary | ICD-10-CM | POA: Diagnosis not present

## 2019-05-22 ENCOUNTER — Telehealth: Payer: Self-pay

## 2019-05-22 NOTE — Telephone Encounter (Signed)
Called patient and advised her that we needed additional information for reconsideration. I asked patient about recent treatments. Patient reports:  Steroid injections done by rheumatologist 5-6 years ago   Celebrex written by rheumatologist, currently taking  Metaxelone been on it for past 12 months, currently taking  Flexeril for past 12 months, currently taking  Physical Therapy started 10/31/19, had to stop recently due to worsening back pain     Patient unable to walk due to feet pain from radiating pain. Patient has now had 2 falls.   Faxing this additional information to insurance for reconsideration. Have requested this be reviewed urgently since patient is having falls and quality of life is at risk.

## 2019-05-22 NOTE — Telephone Encounter (Signed)
Pt called stating that insurance needs additional information to approve MRI. As per pt, please contact - physician service line at [phone], ref [id number]. Pt would like a call with any update. Thanks.

## 2019-06-09 ENCOUNTER — Other Ambulatory Visit: Payer: Self-pay

## 2019-06-09 ENCOUNTER — Ambulatory Visit (INDEPENDENT_AMBULATORY_CARE_PROVIDER_SITE_OTHER): Payer: 59

## 2019-06-09 DIAGNOSIS — M5136 Other intervertebral disc degeneration, lumbar region: Secondary | ICD-10-CM

## 2019-06-09 DIAGNOSIS — M545 Low back pain: Secondary | ICD-10-CM | POA: Diagnosis not present

## 2019-06-09 DIAGNOSIS — M79672 Pain in left foot: Secondary | ICD-10-CM

## 2019-06-09 DIAGNOSIS — M79671 Pain in right foot: Secondary | ICD-10-CM | POA: Diagnosis not present

## 2019-06-11 ENCOUNTER — Other Ambulatory Visit: Payer: Self-pay | Admitting: Family Medicine

## 2019-06-11 DIAGNOSIS — E039 Hypothyroidism, unspecified: Secondary | ICD-10-CM

## 2019-06-11 DIAGNOSIS — E038 Other specified hypothyroidism: Secondary | ICD-10-CM

## 2019-06-12 ENCOUNTER — Telehealth: Payer: Self-pay

## 2019-06-12 NOTE — Telephone Encounter (Signed)
Margaret Beasley called and agreed to the referral to Dr Dorothey Baseman for back pain. She states she will go to the imaging department to pick up MRI disk.

## 2019-06-18 DIAGNOSIS — M2550 Pain in unspecified joint: Secondary | ICD-10-CM | POA: Diagnosis not present

## 2019-06-18 DIAGNOSIS — M797 Fibromyalgia: Secondary | ICD-10-CM | POA: Diagnosis not present

## 2019-06-18 DIAGNOSIS — M5136 Other intervertebral disc degeneration, lumbar region: Secondary | ICD-10-CM | POA: Diagnosis not present

## 2019-06-18 DIAGNOSIS — M545 Low back pain: Secondary | ICD-10-CM | POA: Diagnosis not present

## 2019-06-18 DIAGNOSIS — M47816 Spondylosis without myelopathy or radiculopathy, lumbar region: Secondary | ICD-10-CM | POA: Diagnosis not present

## 2019-07-05 ENCOUNTER — Encounter: Payer: BC Managed Care – PPO | Admitting: Psychology

## 2019-07-12 ENCOUNTER — Telehealth: Payer: Medicare Other | Admitting: Psychology

## 2019-07-17 DIAGNOSIS — G40909 Epilepsy, unspecified, not intractable, without status epilepticus: Secondary | ICD-10-CM | POA: Diagnosis not present

## 2019-07-17 DIAGNOSIS — R4189 Other symptoms and signs involving cognitive functions and awareness: Secondary | ICD-10-CM | POA: Diagnosis not present

## 2019-07-17 DIAGNOSIS — Z87898 Personal history of other specified conditions: Secondary | ICD-10-CM | POA: Diagnosis not present

## 2019-07-17 DIAGNOSIS — Z6841 Body Mass Index (BMI) 40.0 and over, adult: Secondary | ICD-10-CM | POA: Diagnosis not present

## 2019-08-24 ENCOUNTER — Other Ambulatory Visit: Payer: Self-pay | Admitting: Family Medicine

## 2019-09-19 ENCOUNTER — Ambulatory Visit: Payer: 59 | Admitting: Family Medicine

## 2019-10-04 ENCOUNTER — Encounter: Payer: Self-pay | Admitting: Genetic Counselor

## 2019-10-23 ENCOUNTER — Ambulatory Visit: Payer: 59 | Admitting: Family Medicine

## 2019-10-23 DIAGNOSIS — M5136 Other intervertebral disc degeneration, lumbar region: Secondary | ICD-10-CM | POA: Insufficient documentation

## 2019-10-23 DIAGNOSIS — M47816 Spondylosis without myelopathy or radiculopathy, lumbar region: Secondary | ICD-10-CM | POA: Insufficient documentation
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
## 2019-11-06 DIAGNOSIS — R768 Other specified abnormal immunological findings in serum: Secondary | ICD-10-CM | POA: Diagnosis not present

## 2019-11-06 DIAGNOSIS — M8949 Other hypertrophic osteoarthropathy, multiple sites: Secondary | ICD-10-CM | POA: Diagnosis not present

## 2019-11-06 DIAGNOSIS — M3500 Sicca syndrome, unspecified: Secondary | ICD-10-CM | POA: Diagnosis not present

## 2019-11-06 DIAGNOSIS — M79672 Pain in left foot: Secondary | ICD-10-CM | POA: Diagnosis not present

## 2019-11-06 DIAGNOSIS — M5030 Other cervical disc degeneration, unspecified cervical region: Secondary | ICD-10-CM | POA: Diagnosis not present

## 2019-11-06 DIAGNOSIS — M79671 Pain in right foot: Secondary | ICD-10-CM | POA: Diagnosis not present

## 2019-11-06 DIAGNOSIS — M797 Fibromyalgia: Secondary | ICD-10-CM | POA: Diagnosis not present

## 2019-11-22 DIAGNOSIS — M659 Synovitis and tenosynovitis, unspecified: Secondary | ICD-10-CM | POA: Diagnosis not present

## 2019-11-22 DIAGNOSIS — M25472 Effusion, left ankle: Secondary | ICD-10-CM | POA: Diagnosis not present

## 2019-11-22 DIAGNOSIS — M25471 Effusion, right ankle: Secondary | ICD-10-CM | POA: Diagnosis not present

## 2019-11-23 ENCOUNTER — Encounter: Payer: Self-pay | Admitting: Medical-Surgical

## 2019-11-23 ENCOUNTER — Telehealth: Payer: Self-pay

## 2019-11-23 ENCOUNTER — Ambulatory Visit (INDEPENDENT_AMBULATORY_CARE_PROVIDER_SITE_OTHER): Payer: 59 | Admitting: Medical-Surgical

## 2019-11-23 ENCOUNTER — Other Ambulatory Visit: Payer: Self-pay

## 2019-11-23 VITALS — BP 123/87 | HR 84 | Temp 98.1°F | Ht 63.0 in | Wt 230.4 lb

## 2019-11-23 DIAGNOSIS — G43109 Migraine with aura, not intractable, without status migrainosus: Secondary | ICD-10-CM

## 2019-11-23 MED ORDER — PROMETHAZINE HCL 25 MG RE SUPP: 25.0000 mg | Freq: Four times a day (QID) | RECTAL | 0 refills | Status: DC | PRN

## 2019-11-23 MED ORDER — SUMATRIPTAN SUCCINATE 100 MG PO TABS: ORAL_TABLET | ORAL | 1 refills | Status: DC

## 2019-11-23 NOTE — Telephone Encounter (Signed)
Margaret Beasley left an after hours message about migraines. I placed her on Joy's schedule for 2:50 arrival time. I called and left a message advising patient.
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
## 2019-11-30 ENCOUNTER — Other Ambulatory Visit: Payer: Self-pay | Admitting: Family Medicine

## 2019-12-03 MED ORDER — DULOXETINE HCL 30 MG PO CPEP: ORAL_CAPSULE | ORAL | 2 refills | Status: DC

## 2019-12-03 NOTE — Addendum Note (Signed)
Addended by: Narda Rutherford on: 12/03/2019 06:56 AM   Modules accepted: Orders

## 2019-12-11 DIAGNOSIS — M0579 Rheumatoid arthritis with rheumatoid factor of multiple sites without organ or systems involvement: Secondary | ICD-10-CM | POA: Diagnosis not present

## 2019-12-11 DIAGNOSIS — M8949 Other hypertrophic osteoarthropathy, multiple sites: Secondary | ICD-10-CM | POA: Diagnosis not present

## 2019-12-11 DIAGNOSIS — E559 Vitamin D deficiency, unspecified: Secondary | ICD-10-CM | POA: Diagnosis not present

## 2019-12-11 DIAGNOSIS — R768 Other specified abnormal immunological findings in serum: Secondary | ICD-10-CM | POA: Diagnosis not present

## 2019-12-11 DIAGNOSIS — M797 Fibromyalgia: Secondary | ICD-10-CM | POA: Diagnosis not present

## 2019-12-14 ENCOUNTER — Telehealth: Payer: Self-pay

## 2019-12-14 NOTE — Telephone Encounter (Signed)
Margaret Beasley called and left a message stating she is now taking Methotrexate. She states there is an interaction with Methotrexate and Simvastatin. She wanted to know if she should stop the Simvastatin.

## 2019-12-15 NOTE — Telephone Encounter (Signed)
I checked on my drug software and there are not significant interactions with methotrexate and simavastatin.  So ok to continue her statin. Thank you for calling and checking.

## 2019-12-17 NOTE — Telephone Encounter (Signed)
Patient advised.

## 2019-12-21 ENCOUNTER — Other Ambulatory Visit: Payer: Self-pay | Admitting: Family Medicine

## 2019-12-21 DIAGNOSIS — E038 Other specified hypothyroidism: Secondary | ICD-10-CM

## 2019-12-21 NOTE — Telephone Encounter (Signed)
Called and informed pt that she is overdue to have her thyroid checked. Told her that this would be faxed to lab and she can go anytime.

## 2019-12-25 LAB — TSH: TSH: 4.03 mIU/L (ref 0.40–4.50)

## 2019-12-26 ENCOUNTER — Other Ambulatory Visit: Payer: Self-pay | Admitting: Family Medicine

## 2019-12-26 ENCOUNTER — Telehealth (INDEPENDENT_AMBULATORY_CARE_PROVIDER_SITE_OTHER): Payer: 59 | Admitting: Physician Assistant

## 2019-12-26 ENCOUNTER — Encounter: Payer: Self-pay | Admitting: Physician Assistant

## 2019-12-26 VITALS — Temp 97.4°F | Ht 63.0 in | Wt 230.0 lb

## 2019-12-26 DIAGNOSIS — J069 Acute upper respiratory infection, unspecified: Secondary | ICD-10-CM | POA: Diagnosis not present

## 2019-12-26 DIAGNOSIS — Z1231 Encounter for screening mammogram for malignant neoplasm of breast: Secondary | ICD-10-CM
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
## 2019-12-26 NOTE — Progress Notes (Signed)
Started 3 days ago: Cough (productive) Congestion Sore throat (she thinks from drainage)   Has not taken anything OTC, worried about medications because she just started Methotrexate and not sure what she can take with that medication

## 2019-12-27 ENCOUNTER — Telehealth: Payer: Self-pay

## 2019-12-27 ENCOUNTER — Encounter: Payer: Self-pay | Admitting: Physician Assistant

## 2019-12-27 NOTE — Telephone Encounter (Signed)
Margaret Beasley called and left a message stating she was to call back today if not better. She states her cough is worse and needs medication. Please advise.

## 2019-12-27 NOTE — Telephone Encounter (Signed)
I suggested covid testing. She was "certain" that she did not have covid because she was "not that bad". Her husband was symptomatic before and symptoms had already resolved. I gave her supportive care instructions. I do agree she should get covid tested. She has been Audiological scientist.

## 2019-12-27 NOTE — Telephone Encounter (Signed)
Is call patient and see if we can get her swabbed for Covid today.  Margaret Beasley has not finished her note so I cannot tell if maybe she is already had this done or not.  But I definitely think she should get tested because she is immunosuppressed.

## 2019-12-27 NOTE — Telephone Encounter (Signed)
Patient advised of recommendations. She states she is going to wait and see.

## 2019-12-31 ENCOUNTER — Encounter: Payer: Self-pay | Admitting: Nurse Practitioner

## 2019-12-31 ENCOUNTER — Telehealth (INDEPENDENT_AMBULATORY_CARE_PROVIDER_SITE_OTHER): Payer: 59 | Admitting: Nurse Practitioner

## 2019-12-31 VITALS — Temp 97.7°F | Ht 63.0 in | Wt 230.0 lb

## 2019-12-31 DIAGNOSIS — J0100 Acute maxillary sinusitis, unspecified: Secondary | ICD-10-CM | POA: Diagnosis not present

## 2019-12-31 DIAGNOSIS — E038 Other specified hypothyroidism: Secondary | ICD-10-CM

## 2019-12-31 MED ORDER — CEFDINIR 300 MG PO CAPS: 300.0000 mg | ORAL_CAPSULE | Freq: Two times a day (BID) | ORAL | 0 refills | Status: DC

## 2019-12-31 NOTE — Progress Notes (Addendum)
Virtual Video Visit via MyChart Note  I connected with  Jasmine Pang on 12/31/19 at  8:50 AM EST by the video enabled telemedicine application for , MyChart, and verified that I am speaking with the correct person using two identifiers.   I introduced myself as a Designer, jewellery with the practice. We discussed the limitations of evaluation and management by telemedicine and the availability of in person appointments. The patient expressed understanding and agreed to proceed.  The patient is: at home I am: in the office  Subjective:    CC:  Chief Complaint  Patient presents with  . Sinusitis    onset 6 days ago, intermittent cough, sinus congestion, clear mucus, has not been tested for COVID, has a decreased immune system, has tried Mucinex with relief    HPI: [name] is a 64 y.o. y/o female presenting via [facility] today for sinus-like symptoms that have been ongoing for about 6 days. Her husband was also sick for one day, but has recovered. She is immunocompromised and on methotrexate for her RA.   She endorses cough (improving), chest congestion, sinus congestion, sinus pain and pressure, rhinorrhea with clear mucous production, and general ill feeling.   She denies fever, chills, nausea, vomiting, diarrhea, green mucous, loss of taste, or loss of smell.    She has been taking Mucinex D, which has been helpful for her symptoms. She is COVID vaccinated and has had a booster. She has not been COVID tested.  She also tells me that she was seen little over a week ago for labs.  At that time she had her thyroid checked.  She tells me that her PCP did reach out to her and ask if she had missed any doses of her levothyroxine.  She misunderstood the message and reported that she had missed some doses however it was not levothyroxine that she had missed doses of but rather atorvastatin.  She would like to know if there is anything additional that needs to be done about her thyroid at  this time.  Past medical history, Surgical history, Family history not pertinant except as noted below, Social history, Allergies, and medications have been entered into the medical record, reviewed, and corrections made.   Review of Systems:  See HPI for pertinent positive and negatives  Objective:    General: Speaking clearly in complete sentences without any shortness of breath.   Alert and oriented x3.   Normal judgment.  No apparent acute distress. She is audibly congested.    Impression and Recommendations:   1. Acute non-recurrent maxillary sinusitis Symptoms and presentation consistent with acute nonrecurrent maxillary sinusitis. Given that her husband had similar symptoms and has since recovered in a very short period of time I feel that the likelihood that this is Covid related is minimal.  Given her immunocompromise state with active RA on methotrexate we will begin antibiotic therapy today. She is allergic to penicillins but has safely taken cefdinir in the past. Discussed with patient symptom management that may be beneficial including continuation of Mucinex D, lozenges for sore throat, hydration, rest, and recommended over-the-counter probiotic while taking the antibiotic to help restore GI flora. Follow-up if symptoms worsen or fail to improve. - cefdinir (OMNICEF) 300 MG capsule; Take 1 capsule (300 mg total) by mouth 2 (two) times daily.  Dispense: 10 capsule; Refill: 0 .  Dispense: 10 capsule; Refill: 0  2. Subclinical hypothyroidism Given that the patient did not miss any doses of her  levothyroxine and her lab results were abnormal I will plan to share this information with the patient's PCP.  We will make changes to plan of care as necessary and notify patient of the recommendations.    I discussed the assessment and treatment plan with the patient. The patient was provided an opportunity to ask questions and all were answered. The patient agreed with the plan and  demonstrated an understanding of the instructions.   The patient was advised to call back or seek an in-person evaluation if the symptoms worsen or if the condition fails to improve as anticipated.  I provided 20 minutes of non-face-to-face interaction with this [place] visit including intake, same-day documentation, and chart review.   Orma Render, NP

## 2019-12-31 NOTE — Patient Instructions (Signed)
I will talk with Dr. Charise Carwin about the thyroid labs.  If she would like to make changes to medication 1 of [place] will reach out to you to let you know when changes will be made.  For your sinusitis you may continue to utilize Mucinex D and throat lozenges.   Please be sure to rest and drink plenty of fluids especially while taking Mucinex-D.   Over-the-counter Flonase may be helpful for your sinus symptoms as well.  You may also want to take a probiotic which can be found in the pharmacy over-the-counter to help prevent stomach upset with the antibiotic use.  Please be sure that you eat when taking the antibiotic, even if it is a small amount, to help prevent stomach upset.  If your symptoms continue, worsen, or do not improve with antibiotic treatment please let us know.

## 2020-01-09 ENCOUNTER — Other Ambulatory Visit: Payer: Self-pay

## 2020-01-09 ENCOUNTER — Ambulatory Visit (INDEPENDENT_AMBULATORY_CARE_PROVIDER_SITE_OTHER): Payer: 59 | Admitting: Family Medicine

## 2020-01-09 ENCOUNTER — Ambulatory Visit (INDEPENDENT_AMBULATORY_CARE_PROVIDER_SITE_OTHER): Payer: 59

## 2020-01-09 ENCOUNTER — Other Ambulatory Visit: Payer: Self-pay | Admitting: *Deleted

## 2020-01-09 ENCOUNTER — Encounter: Payer: Self-pay | Admitting: Family Medicine

## 2020-01-09 VITALS — BP 135/61 | HR 81 | Ht 63.0 in | Wt 225.0 lb

## 2020-01-09 DIAGNOSIS — R059 Cough, unspecified: Secondary | ICD-10-CM

## 2020-01-09 DIAGNOSIS — E039 Hypothyroidism, unspecified: Secondary | ICD-10-CM | POA: Diagnosis not present

## 2020-01-09 DIAGNOSIS — R899 Unspecified abnormal finding in specimens from other organs, systems and tissues: Secondary | ICD-10-CM

## 2020-01-09 DIAGNOSIS — R6889 Other general symptoms and signs: Secondary | ICD-10-CM | POA: Diagnosis not present

## 2020-01-09 DIAGNOSIS — R5383 Other fatigue: Secondary | ICD-10-CM

## 2020-01-09 MED ORDER — HYDROCODONE-HOMATROPINE 5-1.5 MG/5ML PO SYRP: 5.0000 mL | ORAL_SOLUTION | Freq: Every evening | ORAL | 0 refills | Status: DC | PRN

## 2020-01-09 MED ORDER — BENZONATATE 200 MG PO CAPS: 200.0000 mg | ORAL_CAPSULE | Freq: Two times a day (BID) | ORAL | 0 refills | Status: DC | PRN
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
## 2020-01-09 NOTE — Patient Instructions (Signed)
Please increase your thyroid medication by taking 1 extra pill, 1 day a week for example on Saturdays.  The other 6 days a week you will still just take 1 whole pill daily.  We will recheck your TSH in 6 to 8 weeks to see if that adjustment is working well.

## 2020-01-09 NOTE — Assessment & Plan Note (Signed)
Discussed options.  Recent TSH was borderline elevated at 4.0 she currently takes 50 mcg daily.  I am to have her increase to 1 extra tab 1 day a week for example on Saturdays.  Repeat each week and then recheck her TSH in 6 to 8 weeks.

## 2020-01-10 LAB — CBC WITH DIFFERENTIAL/PLATELET
Absolute Monocytes: 561 cells/uL (ref 200–950)
Basophils Absolute: 32 cells/uL (ref 0–200)
Basophils Relative: 0.5 %
Eosinophils Absolute: 107 cells/uL (ref 15–500)
Eosinophils Relative: 1.7 %
HCT: 46.3 % — ABNORMAL HIGH (ref 35.0–45.0)
Hemoglobin: 15.6 g/dL — ABNORMAL HIGH (ref 11.7–15.5)
Lymphs Abs: 1229 cells/uL (ref 850–3900)
MCH: 31.9 pg (ref 27.0–33.0)
MCHC: 33.7 g/dL (ref 32.0–36.0)
MCV: 94.7 fL (ref 80.0–100.0)
MPV: 10.6 fL (ref 7.5–12.5)
Monocytes Relative: 8.9 %
Neutro Abs: 4372 cells/uL (ref 1500–7800)
Neutrophils Relative %: 69.4 %
Platelets: 239 10*3/uL (ref 140–400)
RBC: 4.89 10*6/uL (ref 3.80–5.10)
RDW: 13.4 % (ref 11.0–15.0)
Total Lymphocyte: 19.5 %
WBC: 6.3 10*3/uL (ref 3.8–10.8)

## 2020-01-10 LAB — LIPID PANEL W/REFLEX DIRECT LDL
Cholesterol: 207 mg/dL — ABNORMAL HIGH (ref <200)
HDL: 51 mg/dL (ref >=50)
LDL Cholesterol (Calc): 119 mg/dL (calc) — ABNORMAL HIGH
Non-HDL Cholesterol (Calc): 156 mg/dL (calc) — ABNORMAL HIGH (ref <130)
Total CHOL/HDL Ratio: 4.1 (calc) (ref <5.0)
Triglycerides: 260 mg/dL — ABNORMAL HIGH (ref <150)

## 2020-01-10 LAB — IRON,TIBC AND FERRITIN PANEL
%SAT: 23 % (calc) (ref 16–45)
Ferritin: 63 ng/mL (ref 16–288)
Iron: 75 ug/dL (ref 45–160)
TIBC: 332 mcg/dL (calc) (ref 250–450)

## 2020-01-10 LAB — COMPLETE METABOLIC PANEL WITH GFR
AG Ratio: 1.6 (calc) (ref 1.0–2.5)
ALT: 21 U/L (ref 6–29)
AST: 25 U/L (ref 10–35)
Albumin: 4.2 g/dL (ref 3.6–5.1)
Alkaline phosphatase (APISO): 70 U/L (ref 37–153)
BUN/Creatinine Ratio: 12 (calc) (ref 6–22)
BUN: 13 mg/dL (ref 7–25)
CO2: 32 mmol/L (ref 20–32)
Calcium: 10 mg/dL (ref 8.6–10.4)
Chloride: 103 mmol/L (ref 98–110)
Creat: 1.11 mg/dL — ABNORMAL HIGH (ref 0.50–0.99)
GFR, Est African American: 61 mL/min/{1.73_m2} (ref >=60)
GFR, Est Non African American: 53 mL/min/{1.73_m2} — ABNORMAL LOW (ref >=60)
Globulin: 2.6 g/dL (calc) (ref 1.9–3.7)
Glucose, Bld: 97 mg/dL (ref 65–99)
Potassium: 5.2 mmol/L (ref 3.5–5.3)
Sodium: 141 mmol/L (ref 135–146)
Total Bilirubin: 0.4 mg/dL (ref 0.2–1.2)
Total Protein: 6.8 g/dL (ref 6.1–8.1)

## 2020-01-10 LAB — TSH: TSH: 1.79 mIU/L (ref 0.40–4.50)

## 2020-01-15 ENCOUNTER — Telehealth: Payer: Self-pay | Admitting: *Deleted

## 2020-01-15 NOTE — Telephone Encounter (Signed)
LVM advising pt that the cough medication is on back order and asked that she either rtn call or send my chart to let us know if she would like this sent to another pharmacy or if she doesn't need this medication anymore.

## 2020-01-16 DIAGNOSIS — Z87898 Personal history of other specified conditions: Secondary | ICD-10-CM | POA: Diagnosis not present

## 2020-01-16 DIAGNOSIS — Z79899 Other long term (current) drug therapy: Secondary | ICD-10-CM | POA: Diagnosis not present

## 2020-01-16 DIAGNOSIS — D84821 Immunodeficiency due to drugs: Secondary | ICD-10-CM | POA: Diagnosis not present

## 2020-01-16 NOTE — Telephone Encounter (Signed)
Patient called and left msg stating she does not need medication.

## 2020-01-17 LAB — HEMOGLOBIN: Hemoglobin: 14.7 g/dL (ref 11.7–15.5)

## 2020-01-23 DIAGNOSIS — M797 Fibromyalgia: Secondary | ICD-10-CM | POA: Diagnosis not present

## 2020-01-23 DIAGNOSIS — M0579 Rheumatoid arthritis with rheumatoid factor of multiple sites without organ or systems involvement: Secondary | ICD-10-CM | POA: Diagnosis not present

## 2020-01-23 DIAGNOSIS — Z79899 Other long term (current) drug therapy: Secondary | ICD-10-CM | POA: Diagnosis not present

## 2020-01-23 DIAGNOSIS — M8949 Other hypertrophic osteoarthropathy, multiple sites: Secondary | ICD-10-CM | POA: Diagnosis not present

## 2020-02-05 ENCOUNTER — Ambulatory Visit: Payer: 59

## 2020-02-25 DIAGNOSIS — H35372 Puckering of macula, left eye: Secondary | ICD-10-CM | POA: Diagnosis not present

## 2020-02-25 DIAGNOSIS — H43393 Other vitreous opacities, bilateral: Secondary | ICD-10-CM | POA: Diagnosis not present

## 2020-02-25 DIAGNOSIS — H43391 Other vitreous opacities, right eye: Secondary | ICD-10-CM | POA: Diagnosis not present

## 2020-02-25 DIAGNOSIS — H43813 Vitreous degeneration, bilateral: Secondary | ICD-10-CM | POA: Diagnosis not present

## 2020-02-25 DIAGNOSIS — H33312 Horseshoe tear of retina without detachment, left eye: Secondary | ICD-10-CM | POA: Diagnosis not present

## 2020-02-25 DIAGNOSIS — H3561 Retinal hemorrhage, right eye: Secondary | ICD-10-CM | POA: Diagnosis not present

## 2020-02-29 DIAGNOSIS — M06871 Other specified rheumatoid arthritis, right ankle and foot: Secondary | ICD-10-CM | POA: Diagnosis not present

## 2020-02-29 DIAGNOSIS — M9272 Juvenile osteochondrosis of metatarsus, left foot: Secondary | ICD-10-CM | POA: Diagnosis not present

## 2020-02-29 DIAGNOSIS — M216X2 Other acquired deformities of left foot: Secondary | ICD-10-CM | POA: Diagnosis not present

## 2020-02-29 DIAGNOSIS — M9271 Juvenile osteochondrosis of metatarsus, right foot: Secondary | ICD-10-CM | POA: Diagnosis not present

## 2020-02-29 DIAGNOSIS — M06872 Other specified rheumatoid arthritis, left ankle and foot: Secondary | ICD-10-CM | POA: Diagnosis not present

## 2020-02-29 DIAGNOSIS — M216X1 Other acquired deformities of right foot: Secondary | ICD-10-CM | POA: Diagnosis not present

## 2020-02-29 DIAGNOSIS — R768 Other specified abnormal immunological findings in serum: Secondary | ICD-10-CM | POA: Diagnosis not present

## 2020-03-10 ENCOUNTER — Ambulatory Visit (INDEPENDENT_AMBULATORY_CARE_PROVIDER_SITE_OTHER): Payer: 59 | Admitting: Family Medicine

## 2020-03-10 ENCOUNTER — Encounter: Payer: Self-pay | Admitting: Family Medicine

## 2020-03-10 ENCOUNTER — Other Ambulatory Visit: Payer: Self-pay

## 2020-03-10 VITALS — BP 135/85 | HR 84 | Ht 63.0 in | Wt 225.0 lb

## 2020-03-10 DIAGNOSIS — E039 Hypothyroidism, unspecified: Secondary | ICD-10-CM

## 2020-03-10 DIAGNOSIS — Z1211 Encounter for screening for malignant neoplasm of colon: Secondary | ICD-10-CM

## 2020-03-10 DIAGNOSIS — R634 Abnormal weight loss: Secondary | ICD-10-CM | POA: Diagnosis not present

## 2020-03-10 DIAGNOSIS — G43109 Migraine with aura, not intractable, without status migrainosus: Secondary | ICD-10-CM | POA: Diagnosis not present

## 2020-03-10 DIAGNOSIS — R110 Nausea: Secondary | ICD-10-CM

## 2020-03-10 DIAGNOSIS — Z1231 Encounter for screening mammogram for malignant neoplasm of breast: Secondary | ICD-10-CM

## 2020-03-10 MED ORDER — LEVOTHYROXINE SODIUM 50 MCG PO TABS: ORAL_TABLET | ORAL | 1 refills | Status: DC

## 2020-03-10 MED ORDER — SUMATRIPTAN SUCCINATE 100 MG PO TABS: ORAL_TABLET | ORAL | 3 refills | Status: DC

## 2020-03-10 NOTE — Assessment & Plan Note (Signed)
Feeling good and doing well on her current regimen though she is having some abnormal weight loss on a make sure that she is not overmedicated with her levothyroxine.  Plan to recheck TSH.

## 2020-03-10 NOTE — Assessment & Plan Note (Signed)
Stable on current regimen we will refill her rescue medication.
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
## 2020-03-13 ENCOUNTER — Other Ambulatory Visit: Payer: Self-pay | Admitting: Neurology

## 2020-03-13 DIAGNOSIS — R748 Abnormal levels of other serum enzymes: Secondary | ICD-10-CM

## 2020-03-13 LAB — COMPLETE METABOLIC PANEL WITH GFR
AG Ratio: 1.6 (calc) (ref 1.0–2.5)
ALT: 34 U/L — ABNORMAL HIGH (ref 6–29)
AST: 25 U/L (ref 10–35)
Albumin: 4.1 g/dL (ref 3.6–5.1)
Alkaline phosphatase (APISO): 64 U/L (ref 37–153)
BUN: 14 mg/dL (ref 7–25)
CO2: 27 mmol/L (ref 20–32)
Calcium: 9.3 mg/dL (ref 8.6–10.4)
Chloride: 104 mmol/L (ref 98–110)
Creat: 0.92 mg/dL (ref 0.50–0.99)
GFR, Est African American: 76 mL/min/{1.73_m2} (ref >=60)
GFR, Est Non African American: 66 mL/min/{1.73_m2} (ref >=60)
Globulin: 2.5 g/dL (calc) (ref 1.9–3.7)
Glucose, Bld: 83 mg/dL (ref 65–139)
Potassium: 4.7 mmol/L (ref 3.5–5.3)
Sodium: 138 mmol/L (ref 135–146)
Total Bilirubin: 0.6 mg/dL (ref 0.2–1.2)
Total Protein: 6.6 g/dL (ref 6.1–8.1)

## 2020-03-13 LAB — TSH: TSH: 1.73 mIU/L (ref 0.40–4.50)

## 2020-03-13 LAB — LIPASE: Lipase: 31 U/L (ref 7–60)

## 2020-03-24 DIAGNOSIS — H33312 Horseshoe tear of retina without detachment, left eye: Secondary | ICD-10-CM | POA: Diagnosis not present

## 2020-03-24 DIAGNOSIS — H4311 Vitreous hemorrhage, right eye: Secondary | ICD-10-CM | POA: Diagnosis not present

## 2020-03-24 DIAGNOSIS — H35372 Puckering of macula, left eye: Secondary | ICD-10-CM | POA: Diagnosis not present

## 2020-03-24 DIAGNOSIS — H43813 Vitreous degeneration, bilateral: Secondary | ICD-10-CM | POA: Diagnosis not present

## 2020-04-02 LAB — COMPLETE METABOLIC PANEL WITH GFR
AG Ratio: 1.9 (calc) (ref 1.0–2.5)
ALT: 15 U/L (ref 6–29)
AST: 16 U/L (ref 10–35)
Albumin: 4 g/dL (ref 3.6–5.1)
Alkaline phosphatase (APISO): 56 U/L (ref 37–153)
BUN/Creatinine Ratio: 10 (calc) (ref 6–22)
BUN: 11 mg/dL (ref 7–25)
CO2: 30 mmol/L (ref 20–32)
Calcium: 9.2 mg/dL (ref 8.6–10.4)
Chloride: 103 mmol/L (ref 98–110)
Creat: 1.05 mg/dL — ABNORMAL HIGH (ref 0.50–0.99)
GFR, Est African American: 65 mL/min/{1.73_m2} (ref >=60)
GFR, Est Non African American: 56 mL/min/{1.73_m2} — ABNORMAL LOW (ref >=60)
Globulin: 2.1 g/dL (calc) (ref 1.9–3.7)
Glucose, Bld: 94 mg/dL (ref 65–99)
Potassium: 4.4 mmol/L (ref 3.5–5.3)
Sodium: 140 mmol/L (ref 135–146)
Total Bilirubin: 0.5 mg/dL (ref 0.2–1.2)
Total Protein: 6.1 g/dL (ref 6.1–8.1)

## 2020-04-02 NOTE — Progress Notes (Signed)
All labs are normal. 

## 2020-04-10 ENCOUNTER — Ambulatory Visit: Payer: 59

## 2020-04-10 ENCOUNTER — Ambulatory Visit: Payer: 59 | Admitting: Family Medicine

## 2020-04-14 DIAGNOSIS — H35372 Puckering of macula, left eye: Secondary | ICD-10-CM | POA: Diagnosis not present

## 2020-04-14 DIAGNOSIS — H4311 Vitreous hemorrhage, right eye: Secondary | ICD-10-CM | POA: Diagnosis not present

## 2020-04-14 DIAGNOSIS — H43813 Vitreous degeneration, bilateral: Secondary | ICD-10-CM | POA: Diagnosis not present

## 2020-04-14 DIAGNOSIS — H43393 Other vitreous opacities, bilateral: Secondary | ICD-10-CM | POA: Diagnosis not present

## 2020-04-30 ENCOUNTER — Ambulatory Visit: Payer: 59

## 2020-05-01 ENCOUNTER — Ambulatory Visit: Payer: 59 | Admitting: Family Medicine

## 2020-05-02 ENCOUNTER — Encounter: Payer: Self-pay | Admitting: Family Medicine

## 2020-05-02 ENCOUNTER — Other Ambulatory Visit: Payer: Self-pay

## 2020-05-02 ENCOUNTER — Other Ambulatory Visit (HOSPITAL_COMMUNITY)
Admission: RE | Admit: 2020-05-02 | Discharge: 2020-05-02 | Disposition: A | Discharge status: Home / Self Care | Payer: 59 | Source: Ambulatory Visit | Attending: Family Medicine | Admitting: Family Medicine

## 2020-05-02 ENCOUNTER — Ambulatory Visit (INDEPENDENT_AMBULATORY_CARE_PROVIDER_SITE_OTHER): Payer: 59 | Admitting: Family Medicine

## 2020-05-02 ENCOUNTER — Ambulatory Visit (INDEPENDENT_AMBULATORY_CARE_PROVIDER_SITE_OTHER): Payer: 59

## 2020-05-02 VITALS — BP 129/75 | HR 80 | Ht 63.0 in | Wt 219.0 lb

## 2020-05-02 DIAGNOSIS — R61 Generalized hyperhidrosis: Secondary | ICD-10-CM | POA: Diagnosis not present

## 2020-05-02 DIAGNOSIS — Z124 Encounter for screening for malignant neoplasm of cervix: Secondary | ICD-10-CM | POA: Insufficient documentation

## 2020-05-02 DIAGNOSIS — E039 Hypothyroidism, unspecified: Secondary | ICD-10-CM | POA: Diagnosis not present

## 2020-05-02 DIAGNOSIS — R634 Abnormal weight loss: Secondary | ICD-10-CM | POA: Diagnosis not present

## 2020-05-02 DIAGNOSIS — Z1151 Encounter for screening for human papillomavirus (HPV): Secondary | ICD-10-CM | POA: Insufficient documentation

## 2020-05-02 DIAGNOSIS — Z1231 Encounter for screening mammogram for malignant neoplasm of breast: Secondary | ICD-10-CM | POA: Diagnosis not present
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
## 2020-05-06 ENCOUNTER — Other Ambulatory Visit: Payer: Self-pay | Admitting: Family Medicine

## 2020-05-06 DIAGNOSIS — R928 Other abnormal and inconclusive findings on diagnostic imaging of breast: Secondary | ICD-10-CM

## 2020-05-06 LAB — CYTOLOGY - PAP
Comment: NEGATIVE
Diagnosis: NEGATIVE
High risk HPV: NEGATIVE

## 2020-05-06 NOTE — Progress Notes (Signed)
Your Pap smear is normal.

## 2020-05-09 ENCOUNTER — Telehealth: Payer: Self-pay

## 2020-05-09 NOTE — Telephone Encounter (Signed)
Richie is concerned about the possible mass. She was wanting to speak with Dr Madilyn Fireman. I advised Dr Madilyn Fireman was out next week. No openings today.

## 2020-05-13 NOTE — Telephone Encounter (Signed)
Called and spoke with Margaret Beasley regarding findings. She has been scheduled for her diagnostic on April 1.

## 2020-05-16 ENCOUNTER — Ambulatory Visit
Admission: RE | Admit: 2020-05-16 | Discharge: 2020-05-16 | Disposition: A | Discharge status: Home / Self Care | Payer: Medicare Other | Source: Ambulatory Visit | Attending: Family Medicine | Admitting: Family Medicine

## 2020-05-16 ENCOUNTER — Ambulatory Visit
Admission: RE | Admit: 2020-05-16 | Discharge: 2020-05-16 | Disposition: A | Discharge status: Home / Self Care | Payer: 59 | Source: Ambulatory Visit | Attending: Family Medicine | Admitting: Family Medicine

## 2020-05-16 ENCOUNTER — Other Ambulatory Visit: Payer: Self-pay | Admitting: Family Medicine

## 2020-05-16 ENCOUNTER — Other Ambulatory Visit: Payer: Self-pay

## 2020-05-16 DIAGNOSIS — R922 Inconclusive mammogram: Secondary | ICD-10-CM | POA: Diagnosis not present

## 2020-05-16 DIAGNOSIS — R928 Other abnormal and inconclusive findings on diagnostic imaging of breast: Secondary | ICD-10-CM

## 2020-05-16 DIAGNOSIS — N6311 Unspecified lump in the right breast, upper outer quadrant: Secondary | ICD-10-CM | POA: Diagnosis not present

## 2020-05-16 DIAGNOSIS — N6312 Unspecified lump in the right breast, upper inner quadrant: Secondary | ICD-10-CM | POA: Diagnosis not present

## 2020-05-19 ENCOUNTER — Ambulatory Visit
Admission: RE | Admit: 2020-05-19 | Discharge: 2020-05-19 | Disposition: A | Discharge status: Home / Self Care | Payer: Medicare Other | Source: Ambulatory Visit | Attending: Family Medicine | Admitting: Family Medicine

## 2020-05-19 ENCOUNTER — Other Ambulatory Visit: Payer: Self-pay

## 2020-05-19 DIAGNOSIS — R928 Other abnormal and inconclusive findings on diagnostic imaging of breast: Secondary | ICD-10-CM

## 2020-05-19 DIAGNOSIS — C50811 Malignant neoplasm of overlapping sites of right female breast: Secondary | ICD-10-CM | POA: Diagnosis not present

## 2020-05-19 DIAGNOSIS — N6311 Unspecified lump in the right breast, upper outer quadrant: Secondary | ICD-10-CM | POA: Diagnosis not present

## 2020-05-21 ENCOUNTER — Telehealth: Payer: Self-pay | Admitting: Hematology and Oncology

## 2020-05-21 ENCOUNTER — Encounter: Payer: Self-pay | Admitting: *Deleted

## 2020-05-21 DIAGNOSIS — Z170 Estrogen receptor positive status [ER+]: Secondary | ICD-10-CM | POA: Insufficient documentation

## 2020-05-21 DIAGNOSIS — C50411 Malignant neoplasm of upper-outer quadrant of right female breast: Secondary | ICD-10-CM | POA: Insufficient documentation

## 2020-05-21 NOTE — Telephone Encounter (Signed)
Spoke to patient to confirm morning [facility] appointment for 4/13, packet mailed to patient
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
## 2020-05-28 ENCOUNTER — Inpatient Hospital Stay: Payer: 59 | Attending: Hematology and Oncology | Admitting: Hematology and Oncology

## 2020-05-28 ENCOUNTER — Ambulatory Visit (HOSPITAL_BASED_OUTPATIENT_CLINIC_OR_DEPARTMENT_OTHER): Payer: Medicare Other | Admitting: Genetic Counselor

## 2020-05-28 ENCOUNTER — Ambulatory Visit: Payer: 59 | Attending: Surgery | Admitting: Physical Therapy

## 2020-05-28 ENCOUNTER — Encounter: Payer: Self-pay | Admitting: Hematology and Oncology

## 2020-05-28 ENCOUNTER — Ambulatory Visit: Payer: Self-pay | Admitting: Surgery

## 2020-05-28 ENCOUNTER — Ambulatory Visit
Admission: RE | Admit: 2020-05-28 | Discharge: 2020-05-28 | Disposition: A | Discharge status: Home / Self Care | Payer: Medicare Other | Source: Ambulatory Visit | Attending: Radiation Oncology | Admitting: Radiation Oncology

## 2020-05-28 ENCOUNTER — Encounter: Payer: Self-pay | Admitting: Physical Therapy

## 2020-05-28 ENCOUNTER — Inpatient Hospital Stay: Payer: 59

## 2020-05-28 ENCOUNTER — Encounter: Payer: Self-pay | Admitting: Licensed Clinical Social Worker

## 2020-05-28 ENCOUNTER — Other Ambulatory Visit: Payer: Self-pay

## 2020-05-28 DIAGNOSIS — Z85828 Personal history of other malignant neoplasm of skin: Secondary | ICD-10-CM | POA: Diagnosis not present

## 2020-05-28 DIAGNOSIS — Z170 Estrogen receptor positive status [ER+]: Secondary | ICD-10-CM

## 2020-05-28 DIAGNOSIS — Z806 Family history of leukemia: Secondary | ICD-10-CM

## 2020-05-28 DIAGNOSIS — Z79899 Other long term (current) drug therapy: Secondary | ICD-10-CM | POA: Insufficient documentation

## 2020-05-28 DIAGNOSIS — G4733 Obstructive sleep apnea (adult) (pediatric): Secondary | ICD-10-CM | POA: Diagnosis not present

## 2020-05-28 DIAGNOSIS — C50911 Malignant neoplasm of unspecified site of right female breast: Secondary | ICD-10-CM

## 2020-05-28 DIAGNOSIS — C50411 Malignant neoplasm of upper-outer quadrant of right female breast: Secondary | ICD-10-CM

## 2020-05-28 DIAGNOSIS — Z803 Family history of malignant neoplasm of breast: Secondary | ICD-10-CM

## 2020-05-28 DIAGNOSIS — R293 Abnormal posture: Secondary | ICD-10-CM | POA: Insufficient documentation

## 2020-05-28 DIAGNOSIS — M797 Fibromyalgia: Secondary | ICD-10-CM | POA: Insufficient documentation

## 2020-05-28 DIAGNOSIS — N189 Chronic kidney disease, unspecified: Secondary | ICD-10-CM | POA: Insufficient documentation

## 2020-05-28 DIAGNOSIS — Z853 Personal history of malignant neoplasm of breast: Secondary | ICD-10-CM

## 2020-05-28 LAB — CMP (CANCER CENTER ONLY)
ALT: 26 U/L (ref 0–44)
AST: 22 U/L (ref 15–41)
Albumin: 3.9 g/dL (ref 3.5–5.0)
Alkaline Phosphatase: 81 U/L (ref 38–126)
Anion gap: 11 (ref 5–15)
BUN: 14 mg/dL (ref 8–23)
CO2: 26 mmol/L (ref 22–32)
Calcium: 9.2 mg/dL (ref 8.9–10.3)
Chloride: 103 mmol/L (ref 98–111)
Creatinine: 1.03 mg/dL — ABNORMAL HIGH (ref 0.44–1.00)
GFR, Estimated: >60 mL/min (ref >60)
Glucose, Bld: 89 mg/dL (ref 70–99)
Potassium: 4.7 mmol/L (ref 3.5–5.1)
Sodium: 140 mmol/L (ref 135–145)
Total Bilirubin: 0.4 mg/dL (ref 0.3–1.2)
Total Protein: 7.3 g/dL (ref 6.5–8.1)

## 2020-05-28 LAB — CBC WITH DIFFERENTIAL (CANCER CENTER ONLY)
Abs Immature Granulocytes: 0.01 10*3/uL (ref 0.00–0.07)
Basophils Absolute: 0 10*3/uL (ref 0.0–0.1)
Basophils Relative: 1 %
Eosinophils Absolute: 0.1 10*3/uL (ref 0.0–0.5)
Eosinophils Relative: 3 %
HCT: 44.7 % (ref 36.0–46.0)
Hemoglobin: 14.9 g/dL (ref 12.0–15.0)
Immature Granulocytes: 0 %
Lymphocytes Relative: 33 %
Lymphs Abs: 1.6 10*3/uL (ref 0.7–4.0)
MCH: 32.5 pg (ref 26.0–34.0)
MCHC: 33.3 g/dL (ref 30.0–36.0)
MCV: 97.6 fL (ref 80.0–100.0)
Monocytes Absolute: 0.6 10*3/uL (ref 0.1–1.0)
Monocytes Relative: 14 %
Neutro Abs: 2.4 10*3/uL (ref 1.7–7.7)
Neutrophils Relative %: 49 %
Platelet Count: 213 10*3/uL (ref 150–400)
RBC: 4.58 MIL/uL (ref 3.87–5.11)
RDW: 13.2 % (ref 11.5–15.5)
WBC Count: 4.8 10*3/uL (ref 4.0–10.5)
nRBC: 0 % (ref 0.0–0.2)

## 2020-05-28 LAB — GENETIC SCREENING ORDER
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
## 2020-05-28 NOTE — Patient Instructions (Signed)

## 2020-05-28 NOTE — H&P (Signed)
Margaret Beasley Appointment: 05/28/2020 9:00 AM Location: [facility] Patient #: [id number] DOB: 09/15/1955 Married / Language: English / Race: White Female  History of Present Illness (Margaret Beasley A. Margaret Sokol MD; 05/28/2020 12:04 PM) Patient words: Patient seen today in the [facility] for new diagnosis of right breast cancer. This was stage I tumor that was both estrogen and progesterone receptor positive HER-2/neu negative with a KI of 10%. 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 Denies history of breast pain nipple discharge or breast mass.  The patient is a 65 year old female.   Past Surgical History (Margaret Smith, RN; 05/28/2020 8:15 AM) Appendectomy Breast Biopsy Bilateral. multiple Breast Mass; Local Excision Left. Cesarean Section - Multiple Foot Surgery Left. Gallbladder Surgery - Laparoscopic Knee Surgery Right. Oral Surgery  Diagnostic Studies History (Margaret Smith, RN; 05/28/2020 8:15 AM) Colonoscopy never Mammogram within last year Pap Smear 1-5 years ago  Medication History (Margaret Smith, RN; 05/28/2020 8:15 AM) Medications Reconciled  Social History (Margaret Smith, RN; 05/28/2020 8:15 AM) Alcohol use Occasional alcohol use. Caffeine use Coffee, Tea. No drug use Tobacco use Never smoker.  Family History (Margaret Smith, RN; 05/28/2020 8:15 AM) Anesthetic complications Mother. Arthritis Brother, Family Members In General, Mother. Breast Cancer Mother. Cancer Family Members In General. Cerebrovascular Accident Family Members In General. Depression Brother, Mother. Hypertension Brother. Melanoma Brother. Migraine Headache Brother, Family Members In General, Mother, Son. Respiratory Condition Mother.  Pregnancy / Birth History (Margaret Smith, RN; 05/28/2020 8:15 AM) Age at menarche 12 years. Age of menopause 46-50 Contraceptive History Oral contraceptives. Gravida 4 Maternal age 21-25 Para 1 Regular  periods  Other Problems (Margaret Smith, RN; 05/28/2020 8:15 AM) Asthma Bladder Problems Breast Cancer Cancer Cholelithiasis Gastroesophageal Reflux Disease Hypercholesterolemia Migraine Headache Other disease, cancer, significant illness Seizure Disorder Sleep Apnea Thyroid Disease Transfusion history     Review of Systems (Margaret Smith RN; 05/28/2020 8:15 AM) General Present- Fatigue, Night Sweats and Weight Loss. Not Present- Appetite Loss, Chills, Fever and Weight Gain. Skin Present- Dryness. Not Present- Change in Wart/Mole, Hives, Jaundice, New Lesions, Non-Healing Wounds, Rash and Ulcer. HEENT Present- Oral Ulcers, Ringing in the Ears and Wears glasses/contact lenses. Not Present- Earache, Hearing Loss, Hoarseness, Nose Bleed, Seasonal Allergies, Sinus Pain, Sore Throat, Visual Disturbances and Yellow Eyes. Respiratory Present- Snoring. Not Present- Bloody sputum, Chronic Cough, Difficulty Breathing and Wheezing. Cardiovascular Present- Swelling of Extremities. Not Present- Chest Pain, Difficulty Breathing Lying Down, Leg Cramps, Palpitations, Rapid Heart Rate and Shortness of Breath. Gastrointestinal Present- Difficulty Swallowing and Nausea. Not Present- Abdominal Pain, Bloating, Bloody Stool, Change in Bowel Habits, Chronic diarrhea, Constipation, Excessive gas, Gets full quickly at meals, Hemorrhoids, Indigestion, Rectal Pain and Vomiting. Female Genitourinary Present- Frequency. Not Present- Nocturia, Painful Urination, Pelvic Pain and Urgency. Musculoskeletal Present- Joint Pain, Joint Stiffness and Swelling of Extremities. Not Present- Back Pain, Muscle Pain and Muscle Weakness. Neurological Present- Seizures and Tremor. Not Present- Decreased Memory, Fainting, Headaches, Numbness, Tingling, Trouble walking and Weakness. Psychiatric Not Present- Anxiety, Bipolar, Change in Sleep Pattern, Depression, Fearful and Frequent crying. Endocrine Not Present- Cold  Intolerance, Excessive Hunger, Hair Changes, Heat Intolerance, Hot flashes and New Diabetes. Hematology Not Present- Blood Thinners, Easy Bruising, Excessive bleeding, Gland problems, HIV and Persistent Infections.   Physical Exam (Laela Deviney A. Quint Chestnut MD; 05/28/2020 12:05 PM)  Breast Note: Left breast scar noted. No evidence of mass left breast. Right breast shows bruising but no mass. Both nipples are normal.  Lymphatic Axillary  General Axillary Region:   Bilateral - Description - Normal. Tenderness - Non Tender.    Assessment & Plan (Archie Atilano A. Velma Hanna MD; 05/28/2020 12:06 PM)  STAGE I BREAST CANCER, RIGHT (C50.911) Impression: Discussed mastectomy versus breast conserving surgery with sentinel lymph node mapping. Risks, benefits, long-term survival, recurrence rates and complications of both procedures discussed. She is opted for a right breast seed localized lumpectomy with right axillary sentinel lymph node mapping. Lymphedema discussed as well and her risk is anywhere from 5-15%. Risk of lumpectomy include bleeding, infection, seroma, more surgery, use of seed/wire, wound care, cosmetic deformity and the need for other treatments, death , blood clots, death. Pt agrees to proceed. Risk of sentinel lymph node mapping include bleeding, infection, lymphedema, shoulder pain. stiffness, dye allergy. cosmetic deformity , blood clots, death, need for more surgery. Pt agres to proceed.  Total time 45 minutes  Current Plans You are being scheduled for surgery- Our schedulers will call you.  You should hear from our office's scheduling department within 5 working days about the location, date, and time of surgery. We try to make accommodations for patient's preferences in scheduling surgery, but sometimes the OR schedule or the surgeon's schedule prevents us from making those accommodations.  If you have not heard from our office ([phone]) in 5 working days, call the office and ask for your  surgeon's nurse.  If you have other questions about your diagnosis, plan, or surgery, call the office and ask for your surgeon's nurse.  Pt Education - [facility] Breast Cancer Information Given - [facility] "Breast Journey" Package We discussed the staging and pathophysiology of breast cancer. We discussed all of the different options for treatment for breast cancer including surgery, chemotherapy, radiation therapy, Herceptin, and antiestrogen therapy. We discussed a sentinel lymph node biopsy as she does not appear to having lymph node involvement right now. We discussed the performance of that with injection of radioactive tracer and blue dye. We discussed that she would have an incision underneath her axillary hairline. We discussed that there is a bout a 10-20% chance of having a positive node with a sentinel lymph node biopsy and we will await the permanent pathology to make any other first further decisions in terms of her treatment. One of these options might be to return to the operating room to perform an axillary lymph node dissection. We discussed about a 1-2% risk lifetime of chronic shoulder pain as well as lymphedema associated with a sentinel lymph node biopsy. We discussed the options for treatment of the breast cancer which included lumpectomy versus a mastectomy. We discussed the performance of the lumpectomy with a wire placement. We discussed a 10-20% chance of a positive margin requiring reexcision in the operating room. We also discussed that she may need radiation therapy or antiestrogen therapy or both if she undergoes lumpectomy. We discussed the mastectomy and the postoperative care for that as well. We discussed that there is no difference in her survival whether she undergoes lumpectomy with radiation therapy or antiestrogen therapy versus a mastectomy. There is a slight difference in the local recurrence rate being 3-5% with lumpectomy and about 1% with a mastectomy. We discussed the  risks of operation including bleeding, infection, possible reoperation. She understands her further therapy will be based on what her stages at the time of her operation.  Pt Education - [facility] breast cancer surgery: discussed with patient and provided information. 
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
## 2020-05-28 NOTE — Progress Notes (Signed)
[place] Work  Initial Assessment   Margaret Beasley is a 65 y.o. year old female accompanied by patient and husband, Clair Gulling. Clinical Social Work was referred by [facility] for assessment of psychosocial needs.   SDOH (Social Determinants of Health) assessments performed: Yes SDOH Interventions   Flowsheet Row Most Recent Value  SDOH Interventions   Food Insecurity Interventions Intervention Not Indicated  Housing Interventions Intervention Not Indicated  Transportation Interventions Intervention Not Indicated      Distress Screen completed: Yes ONCBCN DISTRESS SCREENING 05/28/2020  Screening Type Initial Screening  Distress experienced in past week (1-10) 3  Family Problem type Other (comment)  Physical Problem type Nausea/vomiting;Loss of appetitie      Family/Social Information:  . Housing Arrangement: patient lives with husband, Clair Gulling. Son, Quita Skye [phone]) lives in [place] . Family members/support persons in your life? Family (husband, son, mom), friends, church community . Transportation concerns: no  . Employment: Retired, previously a Radio broadcast assistant. . Financial concerns: No o Type of concern: None . Food access concerns: no . Religious or spiritual practice: yes, faith is extremely important to her and helps her through stressful times . Medication Concerns: no  . Services Currently in place:  n/a  Coping/ Adjustment to diagnosis: . Patient understands treatment plan and what happens next? yes . Concerns about diagnosis and/or treatment: I'm not especially worried about anything . Patient reported stressors: wanting family (mom, son) to be at peace) . Hopes and priorities: hopes to be an inspiration in walking whatever path she is guided on . Patient enjoys reading and movies, thrifting, and yard work . Current coping skills/ strengths: Ability for insight, Active sense of humor, Motivation for treatment/growth, Religious Affiliation, Special hobby/interest and  Supportive family/friends    SUMMARY: Current SDOH Barriers:  . No significant SDOH barriers noted today  Interventions: . Discussed common feeling and emotions when being diagnosed with cancer, and the importance of support during treatment . Informed patient of the support team roles and support services at [facility] . Provided CSW contact information and encouraged patient to call with any questions or concerns   Follow Up Plan: Patient will contact CSW with any support or resource needs Patient verbalizes understanding of plan: Yes    Christeen Douglas , LCSW
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
## 2020-05-28 NOTE — H&P (View-Only) (Signed)
Margaret Beasley Appointment: 05/28/2020 9:00 AM Location: [facility] Patient #: [id number] DOB: 04/11/1955 Married / Language: English / Race: White Female  History of Present Illness ( A.  MD; 05/28/2020 12:04 PM) Patient words: Patient seen today in the [facility] for new diagnosis of right breast cancer. This was stage I tumor that was both estrogen and progesterone receptor positive HER-2/neu negative with a KI of 10%. 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 Denies history of breast pain nipple discharge or breast mass.  The patient is a 65 year old female.   Past Surgical History ([name], RN; 05/28/2020 8:15 AM) Appendectomy Breast Biopsy Bilateral. multiple Breast Mass; Local Excision Left. Cesarean Section - Multiple Foot Surgery Left. Gallbladder Surgery - Laparoscopic Knee Surgery Right. Oral Surgery  Diagnostic Studies History ([name], RN; 05/28/2020 8:15 AM) Colonoscopy never Mammogram within last year Pap Smear 1-5 years ago  Medication History ([name], RN; 05/28/2020 8:15 AM) Medications Reconciled  Social History ([name], RN; 05/28/2020 8:15 AM) Alcohol use Occasional alcohol use. Caffeine use Coffee, Tea. No drug use Tobacco use Never smoker.  Family History ([name], RN; 05/28/2020 8:15 AM) Anesthetic complications Mother. Arthritis Brother, Family Members In General, Mother. Breast Cancer Mother. Cancer Family Members In General. Cerebrovascular Accident Family Members In General. Depression Brother, Mother. Hypertension Brother. Melanoma Brother. Migraine Headache Brother, Family Members In General, Mother, Son. Respiratory Condition Mother.  Pregnancy / Birth History ([name], RN; 05/28/2020 8:15 AM) Age at menarche 12 years. Age of menopause 46-50 Contraceptive History Oral contraceptives. Gravida 4 Maternal age 21-25 Para 1 Regular  periods  Other Problems ([name], RN; 05/28/2020 8:15 AM) Asthma Bladder Problems Breast Cancer Cancer Cholelithiasis Gastroesophageal Reflux Disease Hypercholesterolemia Migraine Headache Other disease, cancer, significant illness Seizure Disorder Sleep Apnea Thyroid Disease Transfusion history     Review of Systems ([name] RN; 05/28/2020 8:15 AM) General Present- Fatigue, Night Sweats and Weight Loss. Not Present- Appetite Loss, Chills, Fever and Weight Gain. Skin Present- Dryness. Not Present- Change in Wart/Mole, Hives, Jaundice, New Lesions, Non-Healing Wounds, Rash and Ulcer. HEENT Present- Oral Ulcers, Ringing in the Ears and Wears glasses/contact lenses. Not Present- Earache, Hearing Loss, Hoarseness, Nose Bleed, Seasonal Allergies, Sinus Pain, Sore Throat, Visual Disturbances and Yellow Eyes. Respiratory Present- Snoring. Not Present- Bloody sputum, Chronic Cough, Difficulty Breathing and Wheezing. Cardiovascular Present- Swelling of Extremities. Not Present- Chest Pain, Difficulty Breathing Lying Down, Leg Cramps, Palpitations, Rapid Heart Rate and Shortness of Breath. Gastrointestinal Present- Difficulty Swallowing and Nausea. Not Present- Abdominal Pain, Bloating, Bloody Stool, Change in Bowel Habits, Chronic diarrhea, Constipation, Excessive gas, Gets full quickly at meals, Hemorrhoids, Indigestion, Rectal Pain and Vomiting. Female Genitourinary Present- Frequency. Not Present- Nocturia, Painful Urination, Pelvic Pain and Urgency. Musculoskeletal Present- Joint Pain, Joint Stiffness and Swelling of Extremities. Not Present- Back Pain, Muscle Pain and Muscle Weakness. Neurological Present- Seizures and Tremor. Not Present- Decreased Memory, Fainting, Headaches, Numbness, Tingling, Trouble walking and Weakness. Psychiatric Not Present- Anxiety, Bipolar, Change in Sleep Pattern, Depression, Fearful and Frequent crying. Endocrine Not Present- Cold  Intolerance, Excessive Hunger, Hair Changes, Heat Intolerance, Hot flashes and New Diabetes. Hematology Not Present- Blood Thinners, Easy Bruising, Excessive bleeding, Gland problems, HIV and Persistent Infections.   Physical Exam ( A.  MD; 05/28/2020 12:05 PM)  Breast Note: Left breast scar noted. No evidence of mass left breast. Right breast shows bruising but no mass. Both nipples are normal.  Lymphatic Axillary  General Axillary Region:   Bilateral - Description - Normal. Tenderness - Non Tender.    Assessment & Plan (Rhylen Pulido A. Manus Weedman MD; 05/28/2020 12:06 PM)  STAGE I BREAST CANCER, RIGHT (C50.911) Impression: Discussed mastectomy versus breast conserving surgery with sentinel lymph node mapping. Risks, benefits, long-term survival, recurrence rates and complications of both procedures discussed. She is opted for a right breast seed localized lumpectomy with right axillary sentinel lymph node mapping. Lymphedema discussed as well and her risk is anywhere from 5-15%. Risk of lumpectomy include bleeding, infection, seroma, more surgery, use of seed/wire, wound care, cosmetic deformity and the need for other treatments, death , blood clots, death. Pt agrees to proceed. Risk of sentinel lymph node mapping include bleeding, infection, lymphedema, shoulder pain. stiffness, dye allergy. cosmetic deformity , blood clots, death, need for more surgery. Pt agres to proceed.  Total time 45 minutes  Current Plans You are being scheduled for surgery- Our schedulers will call you.  You should hear from our office's scheduling department within 5 working days about the location, date, and time of surgery. We try to make accommodations for patient's preferences in scheduling surgery, but sometimes the OR schedule or the surgeon's schedule prevents [place] from making those accommodations.  If you have not heard from our office [phone]) in 5 working days, call the office and ask for your  surgeon's nurse.  If you have other questions about your diagnosis, plan, or surgery, call the office and ask for your surgeon's nurse.  Pt Education - [facility] Breast Cancer Information Given - [facility] "Breast Journey" Package We discussed the staging and pathophysiology of breast cancer. We discussed all of the different options for treatment for breast cancer including surgery, chemotherapy, radiation therapy, Herceptin, and antiestrogen therapy. We discussed a sentinel lymph node biopsy as she does not appear to having lymph node involvement right now. We discussed the performance of that with injection of radioactive tracer and blue dye. We discussed that she would have an incision underneath her axillary hairline. We discussed that there is a bout a 10-20% chance of having a positive node with a sentinel lymph node biopsy and we will await the permanent pathology to make any other first further decisions in terms of her treatment. One of these options might be to return to the operating room to perform an axillary lymph node dissection. We discussed about a 1-2% risk lifetime of chronic shoulder pain as well as lymphedema associated with a sentinel lymph node biopsy. We discussed the options for treatment of the breast cancer which included lumpectomy versus a mastectomy. We discussed the performance of the lumpectomy with a wire placement. We discussed a 10-20% chance of a positive margin requiring reexcision in the operating room. We also discussed that she may need radiation therapy or antiestrogen therapy or both if she undergoes lumpectomy. We discussed the mastectomy and the postoperative care for that as well. We discussed that there is no difference in her survival whether she undergoes lumpectomy with radiation therapy or antiestrogen therapy versus a mastectomy. There is a slight difference in the local recurrence rate being 3-5% with lumpectomy and about 1% with a mastectomy. We discussed the  risks of operation including bleeding, infection, possible reoperation. She understands her further therapy will be based on what her stages at the time of her operation.  Pt Education - [facility] breast cancer surgery: discussed with patient and provided information.

## 2020-05-28 NOTE — Assessment & Plan Note (Signed)
05/21/2020: Screening mammogram showed a possible mass in the right breast. Diagnostic mammogram and US showed a 0.5cm mass at the 12 o'clock position in the right breast, with no right axillary abnormalities. Biopsy showed invasive and in situ ductal carcinoma, grade 1, HER-2 equivocal by IHC, negative by FISH (ratio 1.22), ER+ 95%, PR+ 90%, Ki67 10%.   Pathology and radiology counseling:Discussed with the patient, the details of pathology including the type of breast cancer,the clinical staging, the significance of ER, PR and HER-2/neu receptors and the implications for treatment. After reviewing the pathology in detail, we proceeded to discuss the different treatment options between surgery, radiation, chemotherapy, antiestrogen therapies.  Recommendations: 1. Breast conserving surgery with SLN followed by 2. Adjuvant radiation therapy followed by 3. Adjuvant antiestrogen therapy 4.  Genetics   Return to clinic after surgery to discuss final pathology report

## 2020-05-29 ENCOUNTER — Encounter: Payer: Self-pay | Admitting: Genetic Counselor

## 2020-05-29 DIAGNOSIS — Z806 Family history of leukemia: Secondary | ICD-10-CM

## 2020-05-29 DIAGNOSIS — Z803 Family history of malignant neoplasm of breast: Secondary | ICD-10-CM | POA: Insufficient documentation

## 2020-05-29 HISTORY — DX: Family history of malignant neoplasm of breast: Z80.3

## 2020-05-29 HISTORY — DX: Family history of leukemia: Z80.6

## 2020-06-02 ENCOUNTER — Telehealth: Payer: Self-pay | Admitting: *Deleted

## 2020-06-02 ENCOUNTER — Other Ambulatory Visit: Payer: Self-pay | Admitting: Surgery

## 2020-06-02 ENCOUNTER — Encounter: Payer: Self-pay | Admitting: *Deleted

## 2020-06-02 DIAGNOSIS — Z170 Estrogen receptor positive status [ER+]: Secondary | ICD-10-CM

## 2020-06-02 DIAGNOSIS — C50411 Malignant neoplasm of upper-outer quadrant of right female breast: Secondary | ICD-10-CM

## 2020-06-02 DIAGNOSIS — C50911 Malignant neoplasm of unspecified site of right female breast: Secondary | ICD-10-CM

## 2020-06-02 NOTE — Telephone Encounter (Signed)
Spoke to pt concerning [place] from 4.13.22. Denies questions or concerns regarding dx and treatment care plan. Scheduled and confirmed post-op with Dr. Lindi Adie on 5/20 at 9:45am. Informed will receive a call from Dr. Clabe Seal office for an appt after sx. Encourage pt to call with needs. Received verbal understanding.

## 2020-06-02 NOTE — Telephone Encounter (Signed)
Referral made for pt to see Dr. Sondra Come

## 2020-06-04 ENCOUNTER — Ambulatory Visit: Payer: Self-pay | Admitting: Genetic Counselor

## 2020-06-04 ENCOUNTER — Telehealth: Payer: Self-pay | Admitting: Genetic Counselor

## 2020-06-04 ENCOUNTER — Encounter: Payer: Self-pay | Admitting: Genetic Counselor

## 2020-06-04 DIAGNOSIS — Z803 Family history of malignant neoplasm of breast: Secondary | ICD-10-CM

## 2020-06-04 DIAGNOSIS — Z806 Family history of leukemia: Secondary | ICD-10-CM

## 2020-06-04 DIAGNOSIS — Z170 Estrogen receptor positive status [ER+]: Secondary | ICD-10-CM

## 2020-06-04 DIAGNOSIS — Z1379 Encounter for other screening for genetic and chromosomal anomalies: Secondary | ICD-10-CM

## 2020-06-04 NOTE — Telephone Encounter (Signed)
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  Discussed that we do not know why she has breast cancer or why there is cancer in the family. It could be familial, due to a different gene that we are not testing, or maybe our current technology may not be able to pick something up.  It will be important for her to keep in contact with genetics to keep up with whether additional testing may be needed.

## 2020-06-04 NOTE — Progress Notes (Signed)
HPI:  Ms. Llewellyn was previously seen in the [facility] clinic due to a personal and family history of cancer and concerns regarding a hereditary predisposition to cancer. Please refer to our prior cancer genetics clinic note for more information regarding our discussion, assessment and recommendations, at the time. Ms. Graver recent genetic test results were disclosed to her, as were recommendations warranted by these results. These results and recommendations are discussed in more detail below.  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 The preliminary treatment plan includes surgery, adjuvant radiation, and anti-estrogens.  At the age of 15, Ms. 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    Oncology History  Malignant neoplasm of upper-outer quadrant of right breast in female, estrogen receptor positive ([place])  05/21/2020 Initial Diagnosis   Screening mammogram showed a possible mass in the right breast. Diagnostic mammogram and US showed a 0.5cm mass at the 12 o'clock position in the right breast, with no right axillary abnormalities. Biopsy showed invasive and in situ ductal carcinoma, grade 1, HER-2 equivocal by IHC, negative by FISH (ratio 1.22), ER+ 95%, PR+ 90%, Ki67 10%.    05/28/2020 Cancer Staging   Staging form: Breast, AJCC 8th Edition - Clinical stage from 05/28/2020: Stage IA (cT1a, cN0, cM0, G2, ER+, PR+, HER2-) - Signed by Nicholas Lose, MD on 05/28/2020 Stage prefix: Initial diagnosis Method of lymph node assessment: Clinical Histologic grading system: 3 grade system   06/04/2020 Genetic Testing   Negative hereditary cancer genetic testing: no pathogenic variants detected in [facility] Multi-Cancer Panel.  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  The report date is June 04, 2020.   The Multi-Cancer Panel offered by [facility]  includes sequencing and/or deletion duplication testing of the following 84 genes: AIP, ALK, APC, ATM, AXIN2,BAP1,  BARD1, BLM, BMPR1A, BRCA1, BRCA2, BRIP1, CASR, CDC73, CDH1, CDK4, CDKN1B, CDKN1C, CDKN2A (p14ARF), CDKN2A (p16INK4a), CEBPA, CHEK2, CTNNA1, DICER1, DIS3L2, EGFR (c.2369C>T, p.Thr790Met variant only), EPCAM (Deletion/duplication testing only), FH, FLCN, GATA2, GPC3, GREM1 (Promoter region deletion/duplication testing only), HOXB13 (c.251G>A, p.Gly84Glu), HRAS, KIT, MAX, MEN1, MET, MITF (c.952G>A, p.Glu318Lys variant only), MLH1, MSH2, MSH3, MSH6, MUTYH, NBN, NF1, NF2, NTHL1, PALB2, PDGFRA, PHOX2B, PMS2, POLD1, POLE, POT1, PRKAR1A, PTCH1, PTEN, RAD50, RAD51C, RAD51D, RB1, RECQL4, RET, RUNX1, SDHAF2, SDHA (sequence changes only), SDHB, SDHC, SDHD, SMAD4, SMARCA4, SMARCB1, SMARCE1, STK11, SUFU, TERC, TERT, TMEM127, TP53, TSC1, TSC2, VHL, WRN and WT1.      FAMILY HISTORY:  We obtained a detailed, 4-generation family history.  Significant diagnoses are listed below: Family History  Problem Relation Age of Onset  . Breast cancer Mother 68       two primaries (dx 62, 71s)  . Depression Father   . Multiple myeloma Maternal Grandmother        dx 38s  . Throat cancer Maternal Grandfather        nose cancer; dx before 40  . Leukemia Maternal Uncle        chronic; dx late 35s    Ms. Weinrich has one son, age 3.  Her daughter passed away when Ms. Heskett was 8 months pregnant.  Ms. Dunlop has one maternal half brother, one known paternal half brother, and seven paternal half sisters.  She has no contact with her paternal half siblings.  Her mother was diagnosed with breast cancer (two primaries at age 65 and in  49s).  Her mother is living at age 74 and has not had genetic testing.  Ms. Devine has a maternal uncle with chronic leukemia diagnosed in his 76s, a maternal grandmother with multiple myeloma diagnosed in her 16s, and a maternal grandfather with nose/throat cancer diagnosed before 54.  Ms. Rosenbach  had limited information about her paternal family history.  Her biological father passed away at age 58.   Ms. Hessler is unaware of previous family history of genetic testing for hereditary cancer risks. There is no reported Ashkenazi Jewish ancestry. There is no known consanguinity.  GENETIC TEST RESULTS: Genetic testing reported out on June 04, 2020.  The [facility] Multi-Cancer Panel  found no pathogenic mutations. The Multi-Cancer Panel offered by [facility] includes sequencing and/or deletion duplication testing of the following 84 genes: AIP, ALK, APC, ATM, AXIN2,BAP1,  BARD1, BLM, BMPR1A, BRCA1, BRCA2, BRIP1, CASR, CDC73, CDH1, CDK4, CDKN1B, CDKN1C, CDKN2A (p14ARF), CDKN2A (p16INK4a), CEBPA, CHEK2, CTNNA1, DICER1, DIS3L2, EGFR (c.2369C>T, p.Thr790Met variant only), EPCAM (Deletion/duplication testing only), FH, FLCN, GATA2, GPC3, GREM1 (Promoter region deletion/duplication testing only), HOXB13 (c.251G>A, p.Gly84Glu), HRAS, KIT, MAX, MEN1, MET, MITF (c.952G>A, p.Glu318Lys variant only), MLH1, MSH2, MSH3, MSH6, MUTYH, NBN, NF1, NF2, NTHL1, PALB2, PDGFRA, PHOX2B, PMS2, POLD1, POLE, POT1, PRKAR1A, PTCH1, PTEN, RAD50, RAD51C, RAD51D, RB1, RECQL4, RET, RUNX1, SDHAF2, SDHA (sequence changes only), SDHB, SDHC, SDHD, SMAD4, SMARCA4, SMARCB1, SMARCE1, STK11, SUFU, TERC, TERT, TMEM127, TP53, TSC1, TSC2, VHL, WRN and WT1.  The test report has been scanned into EPIC and is located under the Molecular Pathology section of the Results Review tab.  A portion of the result report is included below for reference.     We discussed with Ms. Luckadoo that because current genetic testing is not perfect, it is possible there may be a gene mutation in one of these genes that current testing cannot detect, but that chance is small.  We also discussed, that there could be another gene that has not yet been discovered, or that we have not yet tested, that is responsible for the cancer diagnoses in the family. It is also possible  there is a hereditary cause for the cancer in the family that Ms. Moilanen did not inherit and therefore was not identified in her testing.  Therefore, it is important to remain in touch with cancer genetics in the future so that we can continue to offer Ms. Sievers the most up to date genetic testing.   Genetic testing did identify a variant of uncertain significance (VUS) was identified in the KIT gene called c.454C>G (p.Gln152Glu).  At this time, it is unknown if this variant is associated with increased cancer risk or if this is a normal finding, but most variants such as this get reclassified to being inconsequential. It should not be used to make medical management decisions. With time, we suspect the lab will determine the significance of this variant, if any. If we do learn more about it, we will try to contact Ms. Dipaola to discuss it further. However, it is important to stay in touch with [place] periodically and keep the address and phone number up to date.  ADDITIONAL GENETIC TESTING: We discussed with Ms. Shams that her genetic testing was fairly extensive.  If there are genes identified to increase cancer risk that can be analyzed in the future, we would be happy to discuss and coordinate this testing at that time.    CANCER SCREENING RECOMMENDATIONS: Ms. Chronis test result is considered negative (normal).  This means that we  have not identified a hereditary cause for her personal history of cancer at this time. Most cancers happen by chance and this negative test suggests that her cancer may fall into this category.    Given Ms. Panzer's personal and family histories, we must interpret these negative results with some caution.  Families with features suggestive of hereditary risk for cancer tend to have multiple family members with cancer, diagnoses in multiple generations and diagnoses before the age of 3. Ms. Trautmann family exhibits some of these features. Thus, this result may simply reflect our  current inability to detect all mutations within these genes or there may be a different gene that has not yet been discovered or tested.   An individual's cancer risk and medical management are not determined by genetic test results alone. Overall cancer risk assessment incorporates additional factors, including personal medical history, family history, and any available genetic information that may result in a personalized plan for cancer prevention and surveillance.  RECOMMENDATIONS FOR FAMILY MEMBERS:  Individuals in this family might be at some increased risk of developing cancer, over the general population risk, simply due to the family history of cancer.  We recommended women in this family have a yearly mammogram beginning at age 95, or 52 years younger than the earliest onset of cancer, an annual clinical breast exam, and perform monthly breast self-exams. Women in this family should also have a gynecological exam as recommended by their primary provider. Family members should be referred for colonoscopy starting at age 21.  It is also possible there is a hereditary cause for the cancer in Ms. Weight's family that she did not inherit and therefore was not identified in her.  Based on Ms. Herford's family history, we recommended her mother, who was diagnosed with breast cancer at age 12, have genetic counseling and testing.  Ms. Deak mother was present at the time of the phone call and is not interested in genetic testing at this point in time. Ms. Thole will let us know if we can be of any assistance in coordinating genetic counseling and/or testing for this family member in the future.   FOLLOW-UP: Lastly, we discussed with Ms. Henshaw that cancer genetics is a rapidly advancing field and it is possible that new genetic tests will be appropriate for her and/or her family members in the future. We encouraged her to remain in contact with cancer genetics on an annual basis so we can update her  personal and family histories and let her know of advances in cancer genetics that may benefit this family.   Our contact number was provided. Ms. Beutler questions were answered to her satisfaction, and she knows she is welcome to call us at anytime with additional questions or concerns.    Jayley Hustead M. Joette Catching, [place], [facility] Genetic Counselor [email or web address] (P) [phone]

## 2020-06-05 ENCOUNTER — Other Ambulatory Visit: Payer: Self-pay | Admitting: *Deleted

## 2020-06-06 ENCOUNTER — Telehealth: Payer: Self-pay | Admitting: *Deleted

## 2020-06-06 NOTE — Telephone Encounter (Signed)
Received call from patient wanting to inform her team that she forgot to tell everyone that she has sjogren's syndrome.  I told her I would let the team know.

## 2020-06-09 ENCOUNTER — Encounter: Payer: Self-pay | Admitting: *Deleted

## 2020-06-19 ENCOUNTER — Other Ambulatory Visit: Payer: Self-pay

## 2020-06-19 ENCOUNTER — Encounter (HOSPITAL_BASED_OUTPATIENT_CLINIC_OR_DEPARTMENT_OTHER): Payer: Self-pay | Admitting: Surgery

## 2020-06-20 ENCOUNTER — Encounter (HOSPITAL_BASED_OUTPATIENT_CLINIC_OR_DEPARTMENT_OTHER)
Admission: RE | Admit: 2020-06-20 | Discharge: 2020-06-20 | Disposition: A | Discharge status: Home / Self Care | Payer: 59 | Source: Ambulatory Visit | Attending: Surgery | Admitting: Surgery

## 2020-06-20 LAB — CBC WITH DIFFERENTIAL/PLATELET
Abs Immature Granulocytes: 0.01 10*3/uL (ref 0.00–0.07)
Basophils Absolute: 0 10*3/uL (ref 0.0–0.1)
Basophils Relative: 1 %
Eosinophils Absolute: 0.1 10*3/uL (ref 0.0–0.5)
Eosinophils Relative: 2 %
HCT: 45.1 % (ref 36.0–46.0)
Hemoglobin: 14.9 g/dL (ref 12.0–15.0)
Immature Granulocytes: 0 %
Lymphocytes Relative: 34 %
Lymphs Abs: 1.4 10*3/uL (ref 0.7–4.0)
MCH: 32.1 pg (ref 26.0–34.0)
MCHC: 33 g/dL (ref 30.0–36.0)
MCV: 97.2 fL (ref 80.0–100.0)
Monocytes Absolute: 0.4 10*3/uL (ref 0.1–1.0)
Monocytes Relative: 9 %
Neutro Abs: 2.3 10*3/uL (ref 1.7–7.7)
Neutrophils Relative %: 54 %
Platelets: 234 10*3/uL (ref 150–400)
RBC: 4.64 MIL/uL (ref 3.87–5.11)
RDW: 12.9 % (ref 11.5–15.5)
WBC: 4.3 10*3/uL (ref 4.0–10.5)
nRBC: 0 % (ref 0.0–0.2)

## 2020-06-20 LAB — COMPREHENSIVE METABOLIC PANEL
ALT: 43 U/L (ref 0–44)
AST: 33 U/L (ref 15–41)
Albumin: 3.8 g/dL (ref 3.5–5.0)
Alkaline Phosphatase: 66 U/L (ref 38–126)
Anion gap: 5 (ref 5–15)
BUN: 10 mg/dL (ref 8–23)
CO2: 31 mmol/L (ref 22–32)
Calcium: 9.3 mg/dL (ref 8.9–10.3)
Chloride: 102 mmol/L (ref 98–111)
Creatinine, Ser: 1.04 mg/dL — ABNORMAL HIGH (ref 0.44–1.00)
GFR, Estimated: >60 mL/min (ref >60)
Glucose, Bld: 95 mg/dL (ref 70–99)
Potassium: 5.3 mmol/L — ABNORMAL HIGH (ref 3.5–5.1)
Sodium: 138 mmol/L (ref 135–145)
Total Bilirubin: 0.7 mg/dL (ref 0.3–1.2)
Total Protein: 6.8 g/dL (ref 6.5–8.1)

## 2020-06-20 MED ORDER — CHLORHEXIDINE GLUCONATE CLOTH 2 % EX PADS: 6.0000 | MEDICATED_PAD | Freq: Once | CUTANEOUS | Status: DC

## 2020-06-20 NOTE — Progress Notes (Signed)
      Enhanced Recovery after Surgery for Orthopedics Enhanced Recovery after Surgery is a protocol used to improve the stress on your body and your recovery after surgery.  Patient Instructions  . The night before surgery:  o No food after midnight. ONLY clear liquids after midnight  . The day of surgery (if you do NOT have diabetes):  o Drink ONE (1) Pre-Surgery Clear Ensure as directed.   o This drink was given to you during your hospital  pre-op appointment visit. o The pre-op nurse will instruct you on the time to drink the  Pre-Surgery Ensure depending on your surgery time. o Finish the drink at the designated time by the pre-op nurse.  o Nothing else to drink after completing the  Pre-Surgery Clear Ensure.  . The day of surgery (if you have diabetes): o Drink ONE (1) Gatorade 2 (G2) as directed. o This drink was given to you during your hospital  pre-op appointment visit.  o The pre-op nurse will instruct you on the time to drink the   Gatorade 2 (G2) depending on your surgery time. o Color of the Gatorade may vary. Red is not allowed. o Nothing else to drink after completing the  Gatorade 2 (G2).         If you have questions, please contact your surgeon's office. Surgical soap and instructions given to patient. Patient verbalized understanding. 

## 2020-06-23 ENCOUNTER — Other Ambulatory Visit (HOSPITAL_COMMUNITY)
Admission: RE | Admit: 2020-06-23 | Discharge: 2020-06-23 | Disposition: A | Discharge status: Home / Self Care | Payer: 59 | Source: Ambulatory Visit | Attending: Surgery | Admitting: Surgery

## 2020-06-23 DIAGNOSIS — Z01812 Encounter for preprocedural laboratory examination: Secondary | ICD-10-CM | POA: Insufficient documentation

## 2020-06-23 DIAGNOSIS — Z20822 Contact with and (suspected) exposure to covid-19: Secondary | ICD-10-CM | POA: Diagnosis not present

## 2020-06-24 LAB — SARS CORONAVIRUS 2 (TAT 6-24 HRS): SARS Coronavirus 2: NEGATIVE

## 2020-06-25 ENCOUNTER — Other Ambulatory Visit: Payer: Self-pay

## 2020-06-25 ENCOUNTER — Ambulatory Visit
Admission: RE | Admit: 2020-06-25 | Discharge: 2020-06-25 | Disposition: A | Discharge status: Home / Self Care | Payer: 59 | Source: Ambulatory Visit | Attending: Surgery | Admitting: Surgery

## 2020-06-25 DIAGNOSIS — C50911 Malignant neoplasm of unspecified site of right female breast: Secondary | ICD-10-CM

## 2020-06-25 DIAGNOSIS — Z170 Estrogen receptor positive status [ER+]: Secondary | ICD-10-CM

## 2020-06-25 NOTE — Anesthesia Preprocedure Evaluation (Addendum)
Anesthesia Evaluation  Patient identified by MRN, date of birth, ID band Patient awake    Reviewed: Allergy & Precautions, NPO status , Patient's Chart, lab work & pertinent test results  History of Anesthesia Complications (+) PONV  Airway Mallampati: II  TM Distance: >3 FB Neck ROM: Full    Dental no notable dental hx. (+) Teeth Intact, Dental Advisory Given   Pulmonary asthma , sleep apnea (cannot tolerate CPAP) ,    Pulmonary exam normal breath sounds clear to auscultation       Cardiovascular Exercise Tolerance: Good Normal cardiovascular exam Rhythm:Regular Rate:Normal     Neuro/Psych  Headaches, PSYCHIATRIC DISORDERS Anxiety Depression    GI/Hepatic GERD  ,  Endo/Other  Hypothyroidism   Renal/GU      Musculoskeletal  (+) Arthritis , Rheumatoid disorders,  Fibromyalgia -Sjogrens dz   Abdominal   Peds  Hematology   Anesthesia Other Findings All: Latex See List  Reproductive/Obstetrics                            Anesthesia Physical Anesthesia Plan  ASA: III  Anesthesia Plan: General   Post-op Pain Management:  Regional for Post-op pain   Induction: Intravenous  PONV Risk Score and Plan: 4 or greater and Treatment may vary due to age or medical condition, Midazolam, Dexamethasone, Ondansetron and Aprepitant  Airway Management Planned: LMA  Additional Equipment: None  Intra-op Plan:   Post-operative Plan: Extubation in OR  Informed Consent:     Dental advisory given  Plan Discussed with:   Anesthesia Plan Comments: (LMA GA w R Pecs Block)       Anesthesia Quick Evaluation

## 2020-06-26 ENCOUNTER — Encounter (HOSPITAL_BASED_OUTPATIENT_CLINIC_OR_DEPARTMENT_OTHER): Payer: Self-pay | Admitting: Surgery

## 2020-06-26 ENCOUNTER — Encounter (HOSPITAL_BASED_OUTPATIENT_CLINIC_OR_DEPARTMENT_OTHER)
Admission: RE | Disposition: A | Discharge status: Home / Self Care | Payer: Self-pay | Source: Home / Self Care | Attending: Surgery

## 2020-06-26 ENCOUNTER — Ambulatory Visit (HOSPITAL_BASED_OUTPATIENT_CLINIC_OR_DEPARTMENT_OTHER): Payer: 59 | Admitting: Anesthesiology

## 2020-06-26 ENCOUNTER — Ambulatory Visit
Admission: RE | Admit: 2020-06-26 | Discharge: 2020-06-26 | Disposition: A | Discharge status: Home / Self Care | Payer: 59 | Source: Ambulatory Visit | Attending: Surgery | Admitting: Surgery

## 2020-06-26 ENCOUNTER — Ambulatory Visit (HOSPITAL_BASED_OUTPATIENT_CLINIC_OR_DEPARTMENT_OTHER)
Admission: RE | Admit: 2020-06-26 | Discharge: 2020-06-26 | Disposition: A | Discharge status: Home / Self Care | Payer: 59 | Attending: Surgery | Admitting: Surgery

## 2020-06-26 ENCOUNTER — Ambulatory Visit (HOSPITAL_COMMUNITY)
Admission: RE | Admit: 2020-06-26 | Discharge: 2020-06-26 | Disposition: A | Discharge status: Home / Self Care | Payer: 59 | Source: Ambulatory Visit | Attending: Surgery | Admitting: Surgery

## 2020-06-26 ENCOUNTER — Other Ambulatory Visit: Payer: Self-pay

## 2020-06-26 DIAGNOSIS — C50411 Malignant neoplasm of upper-outer quadrant of right female breast: Secondary | ICD-10-CM | POA: Diagnosis not present

## 2020-06-26 DIAGNOSIS — C50911 Malignant neoplasm of unspecified site of right female breast: Secondary | ICD-10-CM | POA: Diagnosis not present

## 2020-06-26 DIAGNOSIS — Z170 Estrogen receptor positive status [ER+]: Secondary | ICD-10-CM | POA: Insufficient documentation

## 2020-06-26 HISTORY — DX: Sjogren syndrome, unspecified: M35.00

## 2020-06-26 HISTORY — PX: BREAST LUMPECTOMY WITH RADIOACTIVE SEED AND SENTINEL LYMPH NODE BIOPSY: SHX6550

## 2020-06-26 SURGERY — BREAST LUMPECTOMY WITH RADIOACTIVE SEED AND SENTINEL LYMPH NODE BIOPSY
Anesthesia: General | Site: Breast | Laterality: Right

## 2020-06-26 MED ORDER — LACTATED RINGERS IV SOLN: INTRAVENOUS | Status: DC

## 2020-06-26 MED ORDER — ROPIVACAINE HCL 5 MG/ML IJ SOLN
INTRAMUSCULAR | Status: DC | PRN
  Administered 2020-06-26: 30 mL

## 2020-06-26 MED ORDER — HYDROMORPHONE HCL 1 MG/ML IJ SOLN: 0.2500 mg | INTRAMUSCULAR | Status: DC | PRN

## 2020-06-26 MED ORDER — LIDOCAINE 2% (20 MG/ML) 5 ML SYRINGE
INTRAMUSCULAR | Status: AC
  Filled 2020-06-26: qty 5

## 2020-06-26 MED ORDER — FENTANYL CITRATE (PF) 100 MCG/2ML IJ SOLN
INTRAMUSCULAR | Status: AC
  Filled 2020-06-26: qty 2

## 2020-06-26 MED ORDER — DEXAMETHASONE SODIUM PHOSPHATE 10 MG/ML IJ SOLN
INTRAMUSCULAR | Status: AC
  Filled 2020-06-26: qty 1

## 2020-06-26 MED ORDER — DEXAMETHASONE SODIUM PHOSPHATE 4 MG/ML IJ SOLN
INTRAMUSCULAR | Status: DC | PRN
  Administered 2020-06-26: 5 mg via INTRAVENOUS

## 2020-06-26 MED ORDER — CLINDAMYCIN PHOSPHATE 900 MG/50ML IV SOLN
INTRAVENOUS | Status: AC
  Filled 2020-06-26: qty 50

## 2020-06-26 MED ORDER — MIDAZOLAM HCL 5 MG/5ML IJ SOLN
INTRAMUSCULAR | Status: DC | PRN
  Administered 2020-06-26: 2 mg via INTRAVENOUS

## 2020-06-26 MED ORDER — TECHNETIUM TC 99M TILMANOCEPT KIT
1.0000 | PACK | Freq: Once | INTRAVENOUS | Status: AC | PRN
  Administered 2020-06-26: 1 via INTRADERMAL

## 2020-06-26 MED ORDER — MIDAZOLAM HCL 2 MG/2ML IJ SOLN
INTRAMUSCULAR | Status: AC
  Filled 2020-06-26: qty 2

## 2020-06-26 MED ORDER — OXYCODONE HCL 5 MG PO TABS: 5.0000 mg | ORAL_TABLET | Freq: Once | ORAL | Status: DC | PRN

## 2020-06-26 MED ORDER — SODIUM CHLORIDE 0.9 % IV SOLN
INTRAVENOUS | Status: AC
  Filled 2020-06-26: qty 10

## 2020-06-26 MED ORDER — FENTANYL CITRATE (PF) 100 MCG/2ML IJ SOLN
100.0000 ug | Freq: Once | INTRAMUSCULAR | Status: AC
Start: 2020-06-26 — End: 2020-06-26
  Administered 2020-06-26: 100 ug via INTRAVENOUS

## 2020-06-26 MED ORDER — HYDROCODONE-ACETAMINOPHEN 5-325 MG PO TABS: 1.0000 | ORAL_TABLET | Freq: Four times a day (QID) | ORAL | 0 refills | Status: DC | PRN

## 2020-06-26 MED ORDER — ONDANSETRON HCL 4 MG/2ML IJ SOLN: 4.0000 mg | Freq: Once | INTRAMUSCULAR | Status: DC | PRN

## 2020-06-26 MED ORDER — BUPIVACAINE-EPINEPHRINE (PF) 0.25% -1:200000 IJ SOLN
INTRAMUSCULAR | Status: DC | PRN
  Administered 2020-06-26: 20 mL

## 2020-06-26 MED ORDER — CLONIDINE HCL (ANALGESIA) 100 MCG/ML EP SOLN
EPIDURAL | Status: DC | PRN
  Administered 2020-06-26: 100 ug

## 2020-06-26 MED ORDER — DROPERIDOL 2.5 MG/ML IJ SOLN: 0.6250 mg | Freq: Once | INTRAMUSCULAR | Status: DC | PRN

## 2020-06-26 MED ORDER — OXYCODONE HCL 5 MG/5ML PO SOLN: 5.0000 mg | Freq: Once | ORAL | Status: DC | PRN

## 2020-06-26 MED ORDER — ONDANSETRON HCL 4 MG/2ML IJ SOLN
INTRAMUSCULAR | Status: DC | PRN
  Administered 2020-06-26: 4 mg via INTRAVENOUS

## 2020-06-26 MED ORDER — MIDAZOLAM HCL 2 MG/2ML IJ SOLN
2.0000 mg | Freq: Once | INTRAMUSCULAR | Status: AC
  Administered 2020-06-26: 2 mg via INTRAVENOUS

## 2020-06-26 MED ORDER — CLINDAMYCIN PHOSPHATE 900 MG/50ML IV SOLN
900.0000 mg | INTRAVENOUS | Status: AC
  Administered 2020-06-26: 900 mg via INTRAVENOUS

## 2020-06-26 MED ORDER — APREPITANT 40 MG PO CAPS
ORAL_CAPSULE | ORAL | Status: AC
  Filled 2020-06-26: qty 1

## 2020-06-26 MED ORDER — VANCOMYCIN HCL 500 MG IV SOLR
INTRAVENOUS | Status: DC | PRN
  Administered 2020-06-26: 500 mg via TOPICAL

## 2020-06-26 MED ORDER — ACETAMINOPHEN 10 MG/ML IV SOLN: 1000.0000 mg | Freq: Once | INTRAVENOUS | Status: DC | PRN

## 2020-06-26 MED ORDER — APREPITANT 40 MG PO CAPS
40.0000 mg | ORAL_CAPSULE | Freq: Once | ORAL | Status: AC
  Administered 2020-06-26: 40 mg via ORAL

## 2020-06-26 MED ORDER — PROPOFOL 10 MG/ML IV BOLUS
INTRAVENOUS | Status: DC | PRN
  Administered 2020-06-26: 160 mg via INTRAVENOUS

## 2020-06-26 MED ORDER — FENTANYL CITRATE (PF) 100 MCG/2ML IJ SOLN
INTRAMUSCULAR | Status: DC | PRN
  Administered 2020-06-26: 50 ug via INTRAVENOUS

## 2020-06-26 MED ORDER — ONDANSETRON HCL 4 MG/2ML IJ SOLN
INTRAMUSCULAR | Status: AC
  Filled 2020-06-26: qty 2

## 2020-06-26 MED ORDER — VANCOMYCIN HCL 500 MG IV SOLR
INTRAVENOUS | Status: AC
  Filled 2020-06-26: qty 500

## 2020-06-26 MED ORDER — LIDOCAINE HCL (CARDIAC) PF 100 MG/5ML IV SOSY
PREFILLED_SYRINGE | INTRAVENOUS | Status: DC | PRN
  Administered 2020-06-26: 100 mg via INTRAVENOUS

## 2020-06-26 MED ORDER — PROPOFOL 10 MG/ML IV BOLUS
INTRAVENOUS | Status: AC
  Filled 2020-06-26: qty 20

## 2020-06-26 SURGICAL SUPPLY — 42 items
APPLIER CLIP 9.375 MED OPEN (MISCELLANEOUS) ×2
BINDER BREAST XLRG (GAUZE/BANDAGES/DRESSINGS) IMPLANT
BINDER BREAST XXLRG (GAUZE/BANDAGES/DRESSINGS) ×2 IMPLANT
BLADE SURG 15 STRL LF DISP TIS (BLADE) ×1 IMPLANT
BLADE SURG 15 STRL SS (BLADE) ×1
CANISTER SUCT 1200ML W/VALVE (MISCELLANEOUS) ×2 IMPLANT
CHLORAPREP W/TINT 26 (MISCELLANEOUS) ×2 IMPLANT
CLIP APPLIE 9.375 MED OPEN (MISCELLANEOUS) ×1 IMPLANT
COVER BACK TABLE 60X90IN (DRAPES) ×2 IMPLANT
COVER MAYO STAND STRL (DRAPES) ×2 IMPLANT
COVER PROBE W GEL 5X96 (DRAPES) ×2 IMPLANT
DERMABOND ADVANCED (GAUZE/BANDAGES/DRESSINGS) ×1
DERMABOND ADVANCED .7 DNX12 (GAUZE/BANDAGES/DRESSINGS) ×1 IMPLANT
DRAPE LAPAROSCOPIC ABDOMINAL (DRAPES) ×2 IMPLANT
DRAPE UTILITY XL STRL (DRAPES) ×2 IMPLANT
ELECT COATED BLADE 2.86 ST (ELECTRODE) ×2 IMPLANT
ELECT REM PT RETURN 9FT ADLT (ELECTROSURGICAL) ×2
ELECTRODE REM PT RTRN 9FT ADLT (ELECTROSURGICAL) ×1 IMPLANT
GLOVE SRG 8 PF TXTR STRL LF DI (GLOVE) ×1 IMPLANT
GLOVE SURG POLYISO LF SZ6.5 (GLOVE) ×2 IMPLANT
GLOVE SURG POLYISO LF SZ8 (GLOVE) ×2 IMPLANT
GLOVE SURG UNDER POLY LF SZ8 (GLOVE) ×1
GOWN STRL REUS W/ TWL LRG LVL3 (GOWN DISPOSABLE) ×1 IMPLANT
GOWN STRL REUS W/ TWL XL LVL3 (GOWN DISPOSABLE) ×1 IMPLANT
GOWN STRL REUS W/TWL LRG LVL3 (GOWN DISPOSABLE) ×1
GOWN STRL REUS W/TWL XL LVL3 (GOWN DISPOSABLE) ×1
HEMOSTAT ARISTA ABSORB 3G PWDR (HEMOSTASIS) ×2 IMPLANT
HEMOSTAT SNOW SURGICEL 2X4 (HEMOSTASIS) IMPLANT
KIT MARKER MARGIN INK (KITS) ×2 IMPLANT
NEEDLE HYPO 25X1 1.5 SAFETY (NEEDLE) ×2 IMPLANT
NS IRRIG 1000ML POUR BTL (IV SOLUTION) ×2 IMPLANT
PACK BASIN DAY SURGERY FS (CUSTOM PROCEDURE TRAY) ×2 IMPLANT
PENCIL SMOKE EVACUATOR (MISCELLANEOUS) ×2 IMPLANT
SLEEVE SCD COMPRESS KNEE MED (STOCKING) ×2 IMPLANT
SPONGE LAP 4X18 RFD (DISPOSABLE) ×4 IMPLANT
SUT MNCRL AB 4-0 PS2 18 (SUTURE) ×2 IMPLANT
SUT VICRYL 3-0 CR8 SH (SUTURE) ×4 IMPLANT
SYR CONTROL 10ML LL (SYRINGE) ×2 IMPLANT
TOWEL GREEN STERILE FF (TOWEL DISPOSABLE) ×2 IMPLANT
TRAY FAXITRON CT DISP (TRAY / TRAY PROCEDURE) ×2 IMPLANT
TUBE CONNECTING 20X1/4 (TUBING) ×2 IMPLANT
YANKAUER SUCT BULB TIP NO VENT (SUCTIONS) ×2 IMPLANT

## 2020-06-26 NOTE — Interval H&P Note (Signed)
History and Physical Interval Note:  06/26/2020 9:32 AM  Margaret Beasley  has presented today for surgery, with the diagnosis of RIGHT BREAST CANCER.  The various methods of treatment have been discussed with the patient and family. After consideration of risks, benefits and other options for treatment, the patient has consented to  Procedure(s): RIGHT BREAST LUMPECTOMY WITH RADIOACTIVE SEED AND SENTINEL LYMPH NODE BIOPSY (Right) as a surgical intervention.  The patient's history has been reviewed, patient examined, no change in status, stable for surgery.  I have reviewed the patient's chart and labs.  Questions were answered to the patient's satisfaction.     [place]

## 2020-06-26 NOTE — Anesthesia Procedure Notes (Addendum)
Anesthesia Regional Block: Pectoralis block   Pre-Anesthetic Checklist: ,, timeout performed, Correct Patient, Correct Site, Correct Laterality, Correct Procedure, Correct Position, site marked, Risks and benefits discussed,  Surgical consent,  Pre-op evaluation,  At surgeon's request and post-op pain management  Laterality: Right and Upper  Prep: chloraprep       Needles:  Injection technique: Single-shot  Needle Type: Echogenic Needle     Needle Length: 9cm  Needle Gauge: 21     Additional Needles:   Procedures:,,,, ultrasound used (permanent image in chart),,,,  Narrative:  Start time: 06/26/2020 9:30 AM End time: 06/26/2020 9:36 AM Injection made incrementally with aspirations every 5 mL.  Performed by: Personally  Anesthesiologist: Barnet Glasgow, MD  Additional Notes: Block assessed. Patient tolerated procedure well.

## 2020-06-26 NOTE — Discharge Instructions (Signed)
[facility] °Office Phone Number [phone] ° °BREAST BIOPSY/ PARTIAL MASTECTOMY: POST OP INSTRUCTIONS ° °Always review your discharge instruction sheet given to you by the facility where your surgery was performed. ° °IF YOU HAVE DISABILITY OR FAMILY LEAVE FORMS, YOU MUST BRING THEM TO THE OFFICE FOR PROCESSING.  DO NOT GIVE THEM TO YOUR DOCTOR. ° °1. A prescription for pain medication may be given to you upon discharge.  Take your pain medication as prescribed, if needed.  If narcotic pain medicine is not needed, then you may take acetaminophen (Tylenol) or ibuprofen (Advil) as needed. °2. Take your usually prescribed medications unless otherwise directed °3. If you need a refill on your pain medication, please contact your pharmacy.  They will contact our office to request authorization.  Prescriptions will not be filled after 5pm or on week-ends. °4. You should eat very light the first 24 hours after surgery, such as soup, crackers, pudding, etc.  Resume your normal diet the day after surgery. °5. Most patients will experience some swelling and bruising in the breast.  Ice packs and a good support bra will help.  Swelling and bruising can take several days to resolve.  °6. It is common to experience some constipation if taking pain medication after surgery.  Increasing fluid intake and taking a stool softener will usually help or prevent this problem from occurring.  A mild laxative (Milk of Magnesia or Miralax) should be taken according to package directions if there are no bowel movements after 48 hours. °7. Unless discharge instructions indicate otherwise, you may remove your bandages 24-48 hours after surgery, and you may shower at that time.  You may have steri-strips (small skin tapes) in place directly over the incision.  These strips should be left on the skin for 7-10 days.  If your surgeon used skin glue on the incision, you may shower in 24 hours.  The glue will flake off over the  next 2-3 weeks.  Any sutures or staples will be removed at the office during your follow-up visit. °8. ACTIVITIES:  You may resume regular daily activities (gradually increasing) beginning the next day.  Wearing a good support bra or sports bra minimizes pain and swelling.  You may have sexual intercourse when it is comfortable. °a. You may drive when you no longer are taking prescription pain medication, you can comfortably wear a seatbelt, and you can safely maneuver your car and apply brakes. °b. RETURN TO WORK:  ______________________________________________________________________________________ °9. You should see your doctor in the office for a follow-up appointment approximately two weeks after your surgery.  Your doctor’s nurse will typically make your follow-up appointment when she calls you with your pathology report.  Expect your pathology report 2-3 business days after your surgery.  You may call to check if you do not hear from us after three days. °10. OTHER INSTRUCTIONS: _______________________________________________________________________________________________ _____________________________________________________________________________________________________________________________________ °_____________________________________________________________________________________________________________________________________ °_____________________________________________________________________________________________________________________________________ ° °WHEN TO CALL YOUR DOCTOR: °1. Fever over 101.0 °2. Nausea and/or vomiting. °3. Extreme swelling or bruising. °4. Continued bleeding from incision. °5. Increased pain, redness, or drainage from the incision. ° °The clinic staff is available to answer your questions during regular business hours.  Please don’t hesitate to call and ask to speak to one of the nurses for clinical concerns.  If you have a medical emergency, go to the nearest  emergency room or call 911.  A surgeon from [facility] is always on call at the hospital. ° °For further questions, please visit [email or web address]  ° ° ° ° °  Post Anesthesia Home Care Instructions ° °Activity: °Get plenty of rest for the remainder of the day. A responsible individual must stay with you for 24 hours following the procedure.  °For the next 24 hours, DO NOT: °-Drive a car °-Operate machinery °-Drink alcoholic beverages °-Take any medication unless instructed by your physician °-Make any legal decisions or sign important papers. ° °Meals: °Start with liquid foods such as gelatin or soup. Progress to regular foods as tolerated. Avoid greasy, spicy, heavy foods. If nausea and/or vomiting occur, drink only clear liquids until the nausea and/or vomiting subsides. Call your physician if vomiting continues. ° °Special Instructions/Symptoms: °Your throat may feel dry or sore from the anesthesia or the breathing tube placed in your throat during surgery. If this causes discomfort, gargle with warm salt water. The discomfort should disappear within 24 hours. ° °If you had a scopolamine patch placed behind your ear for the management of post- operative nausea and/or vomiting: ° °1. The medication in the patch is effective for 72 hours, after which it should be removed.  Wrap patch in a tissue and discard in the trash. Wash hands thoroughly with soap and water. °2. You may remove the patch earlier than 72 hours if you experience unpleasant side effects which may include dry mouth, dizziness or visual disturbances. °3. Avoid touching the patch. Wash your hands with soap and water after contact with the patch. °  ° °

## 2020-06-26 NOTE — Op Note (Addendum)
Preoperative diagnosis: Stage I right  breast cancer upper-outer quadrant   Postoperative diagnosis: Same   Procedure: right  breast seed localized lumpectomy with left axillary sentinel lymph node mapping    Surgeon: Erroll Luna M.D.   Anesthesia: LMA with pectoral block anesthesia   EBL: 20 cc   Specimen: right breast mass with clip and seed to pathology and 3  axillary sentinel node hot    Drains: None     Indications for procedure: Patient presents for treatment of her right  breast cancer. She has opted for breast conservation after lengthy discussion of treatment options to include breast conservation surgery and mastectomy and reconstruction. Risks, benefits and alternatives discussed with the patient.The procedure has been discussed with the patient. Alternatives to surgery have been discussed with the patient. Risks of surgery include bleeding, Infection, Seroma formation, death, and the need for further surgery. The patient understands and wishes to proceed. Sentinel lymph node mapping and dissection has been discussed with the patient.  Risk of bleeding, Infection, Seroma formation, Additional procedures,, Shoulder weakness , Shoulder stiffness, Nerve and blood vessel injury lymphedema risk,  and reaction to the mapping dyes have been discussed. Alternatives to surgery have been discussed with the patient. The patient agrees to proceed   .  Description of procedure: Patient underwent placement of right  breast need a radiology earlier in the week. She presents to the holding area and questions are answered. Neoprobe was used to verify clip placement in the right  breast. Patient underwent technetium sulfur colloid injection per protocol. Questions answered. Patient taken back to operating room and placed supine on the operating room table. Patient received 2 g of Ancef. After induction of LMA anesthesia right t breast was prepped and draped in a sterile fashion and timeout done. .  Of note, patient had pectoral block by anesthesia prior to this. Neoprobe was used to identify the radioactive seen in the left upper-outer quadrant. Curvilinear incision made and dissection was carried around to excise all tissue around both the clip and seen. Radiograph showed the mass with gross negative margins. Both seed and clip were in the specimen. Specimen sent to pathology.   Neoprobe was switched to the technetium sulfur colloid setting. Hot spot identify the right axilla. Incision made in the inferior axillary hairline and dissection carried into the axilla. 3 HOT level 1  lymph nodes identified and excised.  Background counts approached 0. Wound  was irrigated and vancomycin powder placed.  Incision  closed with 3-0 Vicryl and 4-0 Monocryl. Lumpectomy site closed in a similar fashion. Dermabond applied. All final counts sponge, needle instruments found to be correct at this point. Patient awoke, taken to recovery in satisfactory condition.

## 2020-06-26 NOTE — Progress Notes (Signed)
Nuc med inj performed by nuc med staff. No additional sedation required. VSS, emotional support provided.

## 2020-06-26 NOTE — Transfer of Care (Signed)
Immediate Anesthesia Transfer of Care Note  Patient: Jasmine Pang  Procedure(s) Performed: RIGHT BREAST LUMPECTOMY WITH RADIOACTIVE SEED AND SENTINEL LYMPH NODE BIOPSY (Right Breast)  Patient Location: PACU  Anesthesia Type:General  Level of Consciousness: awake, alert  and oriented  Airway & Oxygen Therapy: Patient Spontanous Breathing and Patient connected to nasal cannula oxygen  Post-op Assessment: Report given to RN and Post -op Vital signs reviewed and stable  Post vital signs: Reviewed and stable  Last Vitals:  Vitals Value Taken Time  BP 130/79 06/26/20 1119  Temp    Pulse 93 06/26/20 1121  Resp 17 06/26/20 1121  SpO2 100 % 06/26/20 1121  Vitals shown include unvalidated device data.  Last Pain:  Vitals:   06/26/20 0840  TempSrc: Oral  PainSc: 0-No pain      Patients Stated Pain Goal: 6 (32/44/01 0272)  Complications: No complications documented.

## 2020-06-26 NOTE — Anesthesia Postprocedure Evaluation (Signed)
Anesthesia Post Note  Patient: Margaret Beasley  Procedure(s) Performed: RIGHT BREAST LUMPECTOMY WITH RADIOACTIVE SEED AND SENTINEL LYMPH NODE BIOPSY (Right Breast)     Patient location during evaluation: PACU Anesthesia Type: General Level of consciousness: awake and alert Pain management: pain level controlled Vital Signs Assessment: post-procedure vital signs reviewed and stable Respiratory status: spontaneous breathing, nonlabored ventilation, respiratory function stable and patient connected to nasal cannula oxygen Cardiovascular status: blood pressure returned to baseline and stable Postop Assessment: no apparent nausea or vomiting Anesthetic complications: no   No complications documented.  Last Vitals:  Vitals:   06/26/20 1144 06/26/20 1200  BP: 134/75 118/81  Pulse: 82 81  Resp: 14 18  Temp:  36.4 C  SpO2: 95% 94%    Last Pain:  Vitals:   06/26/20 1200  TempSrc:   PainSc: 0-No pain                 Barnet Glasgow

## 2020-06-26 NOTE — Progress Notes (Signed)
Assisted Dr. Houser with right, ultrasound guided, pectoralis block. Side rails up, monitors on throughout procedure. See vital signs in flow sheet. Tolerated Procedure well. 

## 2020-06-26 NOTE — Anesthesia Procedure Notes (Signed)
Procedure Name: LMA Insertion Date/Time: 06/26/2020 10:06 AM Performed by: Bufford Spikes, CRNA Pre-anesthesia Checklist: Patient identified, Emergency Drugs available, Suction available and Patient being monitored Patient Re-evaluated:Patient Re-evaluated prior to induction Oxygen Delivery Method: Circle system utilized Preoxygenation: Pre-oxygenation with 100% oxygen Induction Type: IV induction Ventilation: Mask ventilation without difficulty LMA: LMA inserted LMA Size: 4.0 Number of attempts: 1 Placement Confirmation: positive ETCO2 Tube secured with: Tape Dental Injury: Teeth and Oropharynx as per pre-operative assessment

## 2020-06-27 ENCOUNTER — Encounter (HOSPITAL_BASED_OUTPATIENT_CLINIC_OR_DEPARTMENT_OTHER): Payer: Self-pay | Admitting: Surgery

## 2020-06-27 NOTE — Addendum Note (Signed)
Addendum  created 06/27/20 1223 by Verita Lamb, CRNA   Charge Capture section accepted

## 2020-06-27 NOTE — Addendum Note (Signed)
Addendum  created 06/27/20 1129 by Barnet Glasgow, MD   Clinical Note Signed, Intraprocedure Blocks edited

## 2020-06-30 LAB — SURGICAL PATHOLOGY

## 2020-07-01 ENCOUNTER — Encounter: Payer: Self-pay | Admitting: *Deleted

## 2020-07-01 NOTE — Addendum Note (Signed)
Addendum  created 07/01/20 1255 by Tawni Millers, CRNA   Charge Capture section accepted

## 2020-07-03 NOTE — Progress Notes (Signed)
Patient Care Team: [name], MD as PCP - General (Family Medicine) Penny Pia (Inactive) as Referring Physician (Neurology) Mauro Kaufmann, RN as Oncology Nurse Navigator Rockwell Germany, RN as Oncology Nurse Navigator [place], Marcello Moores, MD as Consulting Physician (General Surgery) Nicholas Lose, MD as Consulting Physician (Hematology and Oncology) Gery Pray, MD as Consulting Physician (Radiation Oncology)  DIAGNOSIS:    ICD-10-CM   1. Malignant neoplasm of upper-outer quadrant of right breast in female, estrogen receptor positive ([place])  C50.411    Z17.0     SUMMARY OF ONCOLOGIC HISTORY: Oncology History  Malignant neoplasm of upper-outer quadrant of right breast in female, estrogen receptor positive ([place])  05/21/2020 Initial Diagnosis   Screening mammogram showed a possible mass in the right breast. Diagnostic mammogram and US showed a 0.5cm mass at the 12 o'clock position in the right breast, with no right axillary abnormalities. Biopsy showed invasive and in situ ductal carcinoma, grade 1, HER-2 equivocal by IHC, negative by FISH (ratio 1.22), ER+ 95%, PR+ 90%, Ki67 10%.    05/28/2020 Cancer Staging   Staging form: Breast, AJCC 8th Edition - Clinical stage from 05/28/2020: Stage IA (cT1a, cN0, cM0, G2, ER+, PR+, HER2-) - Signed by Nicholas Lose, MD on 05/28/2020 Stage prefix: Initial diagnosis Method of lymph node assessment: Clinical Histologic grading system: 3 grade system   06/04/2020 Genetic Testing   Negative hereditary cancer genetic testing: no pathogenic variants detected in [facility] Multi-Cancer Panel.  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  The report date is June 04, 2020.   The Multi-Cancer Panel offered by [facility] includes sequencing and/or deletion duplication testing of the following 84 genes: AIP, ALK, APC, ATM, AXIN2,BAP1,  BARD1, BLM, BMPR1A, BRCA1, BRCA2, BRIP1, CASR, CDC73, CDH1, CDK4, CDKN1B, CDKN1C,  CDKN2A (p14ARF), CDKN2A (p16INK4a), CEBPA, CHEK2, CTNNA1, DICER1, DIS3L2, EGFR (c.2369C>T, p.Thr790Met variant only), EPCAM (Deletion/duplication testing only), FH, FLCN, GATA2, GPC3, GREM1 (Promoter region deletion/duplication testing only), HOXB13 (c.251G>A, p.Gly84Glu), HRAS, KIT, MAX, MEN1, MET, MITF (c.952G>A, p.Glu318Lys variant only), MLH1, MSH2, MSH3, MSH6, MUTYH, NBN, NF1, NF2, NTHL1, PALB2, PDGFRA, PHOX2B, PMS2, POLD1, POLE, POT1, PRKAR1A, PTCH1, PTEN, RAD50, RAD51C, RAD51D, RB1, RECQL4, RET, RUNX1, SDHAF2, SDHA (sequence changes only), SDHB, SDHC, SDHD, SMAD4, SMARCA4, SMARCB1, SMARCE1, STK11, SUFU, TERC, TERT, TMEM127, TP53, TSC1, TSC2, VHL, WRN and WT1.    06/26/2020 Surgery   Right lumpectomy ([place]): IDC, grade 1, 0.2cm, with intermediate grade DCIS, and 2 right axillary lymph nodes negative for carcinoma.     CHIEF COMPLIANT: Follow-up s/p lumpectomy   INTERVAL HISTORY: Margaret Beasley is a 65 y.o. with above-mentioned history of right breast cancer. She underwent a right lumpectomy on 06/26/20 with Dr. Brantley Stage for which pathology showed IDC, grade 1, 0.2cm, with intermediate grade DCIS, and 2 right axillary lymph nodes negative for carcinoma. She presents to the clinic today to review the pathology report and discuss further treatment.  She did extremely well from surgery standpoint.  Denies any pain or discomfort.  ALLERGIES:  is allergic to opium, topiramate, benadryl [diphenhydramine hcl], butorphanol, celecoxib, latex, penicillins, gabapentin, oxycodone-acetaminophen, requip [ropinirole], stadol [butorphanol tartrate], topamax, and tramadol.  MEDICATIONS:  Current Outpatient Medications  Medication Sig Dispense Refill  . DULoxetine (CYMBALTA) 30 MG capsule TAKE 3 CAPSULES DAILY      **LUPIN MFR** 270 capsule 2  . HYDROcodone-acetaminophen (NORCO/VICODIN) 5-325 MG tablet Take 1 tablet by mouth every 6 (six) hours as needed for moderate pain. 15 tablet 0  . lamoTRIgine (LAMICTAL)  100 MG tablet Take 100 mg by mouth every evening.    . levETIRAcetam (KEPPRA XR) 500 MG 24 hr tablet 4 (four) times daily.     Marland Kitchen levothyroxine (SYNTHROID) 50 MCG tablet TAKE 1 TABLET BY MOUTH EVERY DAY BEFORE BREAKFAST. Take 1 extra tab per week. 102 tablet 1  . methotrexate (RHEUMATREX) 5 MG tablet Take 5 mg by mouth once a week. Caution: Chemotherapy. Protect from light.    . promethazine (PHENERGAN) 25 MG suppository Place 1 suppository (25 mg total) rectally every 6 (six) hours as needed for nausea or vomiting. 12 each 0  . simvastatin (ZOCOR) 40 MG tablet TAKE 1 TABLET BY MOUTH EVERYDAY AT BEDTIME 90 tablet 3  . SUMAtriptan (IMITREX) 100 MG tablet TAKE 1 TABLET (100 MG TOTAL) BY MOUTH EVERY 2 (TWO) HOURS AS NEEDED FOR MIGRAINE. 30 tablet 3   No current facility-administered medications for this visit.    PHYSICAL EXAMINATION: ECOG PERFORMANCE STATUS: 1 - Symptomatic but completely ambulatory  Vitals:   07/04/20 0905  BP: 135/72  Pulse: 76  Resp: 18  Temp: (!) 97.5 F (36.4 C)  SpO2: 96%   Filed Weights   07/04/20 0905  Weight: 229 lb 11.2 oz (104.2 kg)    LABORATORY DATA:  I have reviewed the data as listed CMP Latest Ref Rng & Units 06/20/2020 05/28/2020 04/01/2020  Glucose 70 - 99 mg/dL 95 89 94  BUN 8 - 23 mg/dL _0 Creatinine 0.44 - 1.00 mg/dL 1.04(H) 1.03(H) 1.05(H)  Sodium 135 - 145 mmol/L 138 140 140  Potassium 3.5 - 5.1 mmol/L 5.3(H) 4.7 4.4  Chloride 98 - 111 mmol/L 102 103 103  CO2 22 - 32 mmol/L _1 Calcium 8.9 - 10.3 mg/dL 9.3 9.2 9.2  Total Protein 6.5 - 8.1 g/dL 6.8 7.3 6.1  Total Bilirubin 0.3 - 1.2 mg/dL 0.7 0.4 0.5  Alkaline Phos 38 - 126 U/L 66 81 -  AST 15 - 41 U/L 33 22 16  ALT 0 - 44 U/L 43 26 15    Lab Results  Component Value Date   WBC 4.3 06/20/2020   HGB 14.9 06/20/2020   HCT 45.1 06/20/2020   MCV 97.2 06/20/2020   PLT 234 06/20/2020   NEUTROABS 2.3 06/20/2020    ASSESSMENT & PLAN:  Malignant neoplasm of upper-outer  quadrant of right breast in female, estrogen receptor positive ([place]) 05/21/2020: Screening mammogram showed a possible mass in the right breast. Diagnostic mammogram and US showed a 0.5cm mass at the 12 o'clock position in the right breast, with no right axillary abnormalities. Biopsy showed invasive and in situ ductal carcinoma, grade 1, HER-2 equivocal by IHC, negative by FISH (ratio 1.22), ER+ 95%, PR+ 90%, Ki67 10%.   06/26/20: Rt Lumpectomy: Grade 1 IDC with DCIS 0/2 LN neg, Margins neg, ER+ 95%, PR+ 90%, Ki67 10%. , Her 2 Neg  Pathology counseling: I discussed the final pathology report of the patient provided  a copy of this report. I discussed the margins as well as lymph node surgeries. We also discussed the final staging along with previously performed ER/PR and HER-2/neu testing.  Treatment Plan 1. Adjuvant XRT 2. Followed by Adj Anti estrogen therapy with anastrozole 1 mg daily x5 years Patient has rheumatoid arthritis and is very concerned about the risks and benefits of anastrozole therapy.  I discussed with her that we will make a decision after going through all the risks and benefits after she completes with radiation.  Return to clinic at the end of radiation. 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  I instructed her to wait for a little while longer for her to completely heal before doing all that.   No orders of the defined types were placed in this encounter.  The patient has a good understanding of the overall plan. she agrees with it. she will call with any problems that may develop before the next visit here.  Total time spent: 30 mins including face to face time and time spent for planning, charting and coordination of care  Rulon Eisenmenger, MD, MPH 07/04/2020  I, Molly Dorshimer, am acting as scribe for Dr. Nicholas Lose.  I have reviewed the above documentation for accuracy and completeness, and I agree with the  above.

## 2020-07-03 NOTE — Assessment & Plan Note (Signed)
05/21/2020: Screening mammogram showed a possible mass in the right breast. Diagnostic mammogram and US showed a 0.5cm mass at the 12 o'clock position in the right breast, with no right axillary abnormalities. Biopsy showed invasive and in situ ductal carcinoma, grade 1, HER-2 equivocal by IHC, negative by FISH (ratio 1.22), ER+ 95%, PR+ 90%, Ki67 10%.   06/26/20: Rt Lumpectomy: Grade 1 IDC with DCIS 0/2 LN neg, Margins neg, ER+ 95%, PR+ 90%, Ki67 10%. , Her 2 Neg  Pathology counseling: I discussed the final pathology report of the patient provided  a copy of this report. I discussed the margins as well as lymph node surgeries. We also discussed the final staging along with previously performed ER/PR and HER-2/neu testing.  Treatment Plan 1. Adjuvant XRT 2. Followed by Adj Anti estrogen therapy   

## 2020-07-04 ENCOUNTER — Other Ambulatory Visit: Payer: Self-pay

## 2020-07-04 ENCOUNTER — Inpatient Hospital Stay: Payer: 59 | Attending: Hematology and Oncology | Admitting: Hematology and Oncology

## 2020-07-04 DIAGNOSIS — Z79899 Other long term (current) drug therapy: Secondary | ICD-10-CM | POA: Insufficient documentation

## 2020-07-04 DIAGNOSIS — Z170 Estrogen receptor positive status [ER+]: Secondary | ICD-10-CM | POA: Diagnosis not present

## 2020-07-04 DIAGNOSIS — C50411 Malignant neoplasm of upper-outer quadrant of right female breast: Secondary | ICD-10-CM | POA: Diagnosis not present

## 2020-07-18 LAB — CBC WITH DIFFERENTIAL/PLATELET
Absolute Monocytes: 608 cells/uL (ref 200–950)
Basophils Absolute: 32 cells/uL (ref 0–200)
Basophils Relative: 0.7 %
Eosinophils Absolute: 140 cells/uL (ref 15–500)
Eosinophils Relative: 3.1 %
HCT: 45.5 % — ABNORMAL HIGH (ref 35.0–45.0)
Hemoglobin: 14.7 g/dL (ref 11.7–15.5)
Lymphs Abs: 1787 cells/uL (ref 850–3900)
MCH: 30.9 pg (ref 27.0–33.0)
MCHC: 32.3 g/dL (ref 32.0–36.0)
MCV: 95.8 fL (ref 80.0–100.0)
MPV: 10.5 fL (ref 7.5–12.5)
Monocytes Relative: 13.5 %
Neutro Abs: 1935 cells/uL (ref 1500–7800)
Neutrophils Relative %: 43 %
Platelets: 250 10*3/uL (ref 140–400)
RBC: 4.75 10*6/uL (ref 3.80–5.10)
RDW: 12.2 % (ref 11.0–15.0)
Total Lymphocyte: 39.7 %
WBC: 4.5 10*3/uL (ref 3.8–10.8)

## 2020-07-18 LAB — TSH+FREE T4: TSH W/REFLEX TO FT4: 2.05 mIU/L (ref 0.40–4.50)

## 2020-07-18 NOTE — Progress Notes (Signed)
All labs are normal. 

## 2020-07-21 ENCOUNTER — Other Ambulatory Visit: Payer: Self-pay

## 2020-07-21 ENCOUNTER — Ambulatory Visit: Payer: 59 | Admitting: Physical Therapy

## 2020-07-21 ENCOUNTER — Encounter: Payer: Self-pay | Admitting: Physical Therapy

## 2020-07-21 ENCOUNTER — Ambulatory Visit: Payer: 59 | Attending: Surgery | Admitting: Physical Therapy

## 2020-07-21 DIAGNOSIS — C50411 Malignant neoplasm of upper-outer quadrant of right female breast: Secondary | ICD-10-CM | POA: Diagnosis not present

## 2020-07-21 DIAGNOSIS — R293 Abnormal posture: Secondary | ICD-10-CM | POA: Insufficient documentation

## 2020-07-21 DIAGNOSIS — Z170 Estrogen receptor positive status [ER+]: Secondary | ICD-10-CM | POA: Diagnosis not present
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
## 2020-07-23 NOTE — Progress Notes (Signed)
Location of Breast Cancer:right upper-outer quadrant   Histology per Pathology Report:  06/26/2020   Receptor Status:    Did patient present with symptoms (if so, please note symptoms) or was this found on screening mammography?: routine screening mammography on 05/02/2020   Past/Anticipated interventions by surgeon, if any:  06/26/2020 Procedure: right  breast seed localized lumpectomy with left axillary sentinel lymph node mapping     Surgeon: Erroll Luna M.D.   Past/Anticipated interventions by medical oncology, if any: Dr Lindi Adie    Lymphedema issues, if any:  no    Pain issues, if any:  yes, bilateral knees intermittent and stabbing  SAFETY ISSUES: Prior radiation? no Pacemaker/ICD? no Possible current pregnancy?no, postmenopausal Is the patient on methotrexate? no  Current Complaints / other details:  Rheumatoid Arthritis pain has returned since holding methotrexate.     Vitals:   07/28/20 1349  BP: 133/82  Pulse: 88  Resp: 20  Temp: 97.9 F (36.6 C)  TempSrc: Oral  SpO2: 99%  Weight: 228 lb 12.8 oz (103.8 kg)  Height: 5\' 3"  (1.6 m)

## 2020-07-24 ENCOUNTER — Ambulatory Visit: Payer: Medicare Other | Admitting: Physical Therapy
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
## 2020-07-28 ENCOUNTER — Ambulatory Visit
Admission: RE | Admit: 2020-07-28 | Discharge: 2020-07-28 | Disposition: A | Discharge status: Home / Self Care | Payer: 59 | Source: Ambulatory Visit | Attending: Radiation Oncology | Admitting: Radiation Oncology

## 2020-07-28 ENCOUNTER — Ambulatory Visit: Payer: 59 | Admitting: Radiation Oncology

## 2020-07-28 ENCOUNTER — Other Ambulatory Visit: Payer: Self-pay

## 2020-07-28 ENCOUNTER — Encounter: Payer: Self-pay | Admitting: Radiation Oncology

## 2020-07-28 VITALS — BP 133/82 | HR 88 | Temp 97.9°F | Resp 20 | Ht 63.0 in | Wt 228.8 lb

## 2020-07-28 DIAGNOSIS — N1832 Chronic kidney disease, stage 3b: Secondary | ICD-10-CM | POA: Diagnosis not present

## 2020-07-28 DIAGNOSIS — Z806 Family history of leukemia: Secondary | ICD-10-CM | POA: Insufficient documentation

## 2020-07-28 DIAGNOSIS — Z79899 Other long term (current) drug therapy: Secondary | ICD-10-CM | POA: Insufficient documentation

## 2020-07-28 DIAGNOSIS — Z85828 Personal history of other malignant neoplasm of skin: Secondary | ICD-10-CM | POA: Insufficient documentation

## 2020-07-28 DIAGNOSIS — Z170 Estrogen receptor positive status [ER+]: Secondary | ICD-10-CM | POA: Insufficient documentation

## 2020-07-28 DIAGNOSIS — G2581 Restless legs syndrome: Secondary | ICD-10-CM | POA: Insufficient documentation

## 2020-07-28 DIAGNOSIS — M3500 Sicca syndrome, unspecified: Secondary | ICD-10-CM | POA: Diagnosis not present

## 2020-07-28 DIAGNOSIS — C50411 Malignant neoplasm of upper-outer quadrant of right female breast: Secondary | ICD-10-CM | POA: Insufficient documentation

## 2020-07-28 DIAGNOSIS — M797 Fibromyalgia: Secondary | ICD-10-CM | POA: Diagnosis not present

## 2020-07-28 DIAGNOSIS — Z803 Family history of malignant neoplasm of breast: Secondary | ICD-10-CM | POA: Diagnosis not present

## 2020-07-28 DIAGNOSIS — G40909 Epilepsy, unspecified, not intractable, without status epilepticus: Secondary | ICD-10-CM | POA: Diagnosis not present

## 2020-07-28 DIAGNOSIS — Z79811 Long term (current) use of aromatase inhibitors: Secondary | ICD-10-CM | POA: Insufficient documentation

## 2020-07-28 DIAGNOSIS — Z800 Family history of malignant neoplasm of digestive organs: Secondary | ICD-10-CM | POA: Diagnosis not present

## 2020-07-28 DIAGNOSIS — E7800 Pure hypercholesterolemia, unspecified: Secondary | ICD-10-CM | POA: Diagnosis not present

## 2020-07-28 NOTE — Progress Notes (Signed)
See MD note for nursing evaluation. °

## 2020-07-29 ENCOUNTER — Encounter: Payer: Self-pay | Admitting: Licensed Clinical Social Worker

## 2020-07-29 NOTE — Progress Notes (Signed)
[place] Psychosocial Distress Screening Clinical Social Work  Clinical Social Work was referred by distress screening protocol.  The patient scored a 5 on the Psychosocial Distress Thermometer which indicates moderate distress. Clinical Social Worker  attempted to contact patient by phone  to assess for distress and other psychosocial needs.   No answer. Left VM with direct contact information.  ONCBCN DISTRESS SCREENING 07/28/2020  Screening Type Initial Screening  Distress experienced in past week (1-10) 5  Family Problem type   Physical Problem type Sleep/insomnia;Swollen arms/legs     Setareh Rom E Orby Tangen, LCSW

## 2020-08-01 ENCOUNTER — Encounter: Payer: Self-pay | Admitting: *Deleted

## 2020-08-03 ENCOUNTER — Other Ambulatory Visit: Payer: Self-pay | Admitting: Family Medicine

## 2020-08-04 ENCOUNTER — Telehealth: Payer: Self-pay | Admitting: Hematology and Oncology

## 2020-08-04 NOTE — Telephone Encounter (Signed)
Scheduled appt per 6/17 sch msg. Pt aware.  

## 2020-08-12 ENCOUNTER — Telehealth: Payer: Self-pay | Admitting: Family Medicine

## 2020-08-12 NOTE — Telephone Encounter (Signed)
Does she need refills?

## 2020-08-12 NOTE — Telephone Encounter (Signed)
Pt called. She states that in her Deloris Ping it is documented that she had a visit with Dr. Madilyn Fireman on Sunday June 19th for med refills. She states that she  tried  to leave a message on Tonya's voicemail about this issue but message of you all being out of the office is still on the recording and she was unable to leave a vm about this issue.  Thank you.

## 2020-08-13 NOTE — Telephone Encounter (Signed)
Margaret Beasley informed pt of what this was regarding on yesterday. No further questions

## 2020-08-13 NOTE — Progress Notes (Signed)
Patient Care Team: [name], Margaret Beasley as PCP - General (Family Medicine) Paulino Rily (Inactive) as Referring Physician (Neurology) Pershing Proud, RN as Oncology Nurse Navigator Donnelly Angelica, RN as Oncology Nurse Navigator Margaret Beasley, Margaret Fus, Margaret Beasley as Consulting Physician (General Surgery) [name], Margaret Beasley as Consulting Physician (Hematology and Oncology) Antony Blackbird, Margaret Beasley as Consulting Physician (Radiation Oncology)  DIAGNOSIS:    ICD-10-CM   1. Malignant neoplasm of upper-outer quadrant of right breast in female, estrogen receptor positive (HCC)  C50.411    Z17.0       SUMMARY OF ONCOLOGIC HISTORY: Oncology History  Malignant neoplasm of upper-outer quadrant of right breast in female, estrogen receptor positive (HCC)  05/21/2020 Initial Diagnosis   Screening mammogram showed a possible mass in the right breast. Diagnostic mammogram and US showed a 0.5cm mass at the 12 o'clock position in the right breast, with no right axillary abnormalities. Biopsy showed invasive and in situ ductal carcinoma, grade 1, HER-2 equivocal by IHC, negative by FISH (ratio 1.22), ER+ 95%, PR+ 90%, Ki67 10%.    05/28/2020 Cancer Staging   Staging form: Breast, AJCC 8th Edition - Clinical stage from 05/28/2020: Stage IA (cT1a, cN0, cM0, G2, ER+, PR+, HER2-) - Signed by [name], Margaret Beasley on 05/28/2020  Stage prefix: Initial diagnosis  Method of lymph node assessment: Clinical  Histologic grading system: 3 grade system    06/04/2020 Genetic Testing   Negative hereditary cancer genetic testing: no pathogenic variants detected in [facility] Multi-Cancer Panel.  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  The report date is June 04, 2020.   The Multi-Cancer Panel offered by [facility] includes sequencing and/or deletion duplication testing of the following 84 genes: AIP, ALK, APC, ATM, AXIN2,BAP1,  BARD1, BLM, BMPR1A, BRCA1, BRCA2, BRIP1, CASR, CDC73, CDH1, CDK4, CDKN1B,  CDKN1C, CDKN2A (p14ARF), CDKN2A (p16INK4a), CEBPA, CHEK2, CTNNA1, DICER1, DIS3L2, EGFR (c.2369C>T, p.Thr790Met variant only), EPCAM (Deletion/duplication testing only), FH, FLCN, GATA2, GPC3, GREM1 (Promoter region deletion/duplication testing only), HOXB13 (c.251G>A, p.Gly84Glu), HRAS, KIT, MAX, MEN1, MET, MITF (c.952G>A, p.Glu318Lys variant only), MLH1, MSH2, MSH3, MSH6, MUTYH, NBN, NF1, NF2, NTHL1, PALB2, PDGFRA, PHOX2B, PMS2, POLD1, POLE, POT1, PRKAR1A, PTCH1, PTEN, RAD50, RAD51C, RAD51D, RB1, RECQL4, RET, RUNX1, SDHAF2, SDHA (sequence changes only), SDHB, SDHC, SDHD, SMAD4, SMARCA4, SMARCB1, SMARCE1, STK11, SUFU, TERC, TERT, TMEM127, TP53, TSC1, TSC2, VHL, WRN and WT1.    06/26/2020 Surgery   Right lumpectomy (Margaret Beasley): IDC, grade 1, 0.2cm, with intermediate grade DCIS, and 2 right axillary lymph nodes negative for carcinoma.     CHIEF COMPLIANT: Follow-up to discuss radiation  INTERVAL HISTORY: Margaret Beasley is a 65 y.o. with above-mentioned history of right breast cancer. She underwent a right lumpectomy on 06/26/20 with Dr. Luisa Beasley for which pathology showed IDC, grade 1, 0.2cm, with intermediate grade DCIS, and 2 right axillary lymph nodes negative for carcinoma.  She went for radiation simulation and decided not to do radiation.  She is now interested in proceeding with radiation.  She has fibrous tissue beneath the right breast scar.  ALLERGIES:  is allergic to opium, topiramate, benadryl [diphenhydramine hcl], butorphanol, celecoxib, latex, penicillins, gabapentin, oxycodone-acetaminophen, requip [ropinirole], stadol [butorphanol tartrate], topamax, and tramadol.  MEDICATIONS:  Current Outpatient Medications  Medication Sig Dispense Refill   DULoxetine (CYMBALTA) 30 MG capsule Take 3 capsules (90 mg total) by mouth daily. **LUPIN MFR**  **PATIENT NEEDS OFFICE VISIT FOR ADDITIONAL REFILLS** 90 capsule 0   folic acid (FOLVITE) 1 MG tablet Take 1 tablet by mouth  daily.      HYDROcodone-acetaminophen (NORCO/VICODIN) 5-325 MG tablet Take 1 tablet by mouth every 6 (six) hours as needed for moderate pain. (Patient not taking: Reported on 07/28/2020) 15 tablet 0   lamoTRIgine (LAMICTAL) 100 MG tablet Take 100 mg by mouth every evening.     levETIRAcetam (KEPPRA XR) 500 MG 24 hr tablet 4 (four) times daily.      levothyroxine (SYNTHROID) 50 MCG tablet TAKE 1 TABLET BY MOUTH EVERY DAY BEFORE BREAKFAST. Take 1 extra tab per week. 102 tablet 1   methotrexate (RHEUMATREX) 5 MG tablet Take 5 mg by mouth once a week. Caution: Chemotherapy. Protect from light. (Patient not taking: Reported on 07/28/2020)     promethazine (PHENERGAN) 25 MG suppository Place 1 suppository (25 mg total) rectally every 6 (six) hours as needed for nausea or vomiting. 12 each 0   simvastatin (ZOCOR) 40 MG tablet TAKE 1 TABLET BY MOUTH EVERYDAY AT BEDTIME 90 tablet 3   SUMAtriptan (IMITREX) 100 MG tablet TAKE 1 TABLET (100 MG TOTAL) BY MOUTH EVERY 2 (TWO) HOURS AS NEEDED FOR MIGRAINE. 30 tablet 3   No current facility-administered medications for this visit.    PHYSICAL EXAMINATION: ECOG PERFORMANCE STATUS: 1 - Symptomatic but completely ambulatory  Vitals:   08/14/20 1109  BP: (!) 139/92  Pulse: 84  Resp: 18  Temp: (!) 97.4 F (36.3 C)  SpO2: 98%   Filed Weights   08/14/20 1109  Weight: 233 lb 14.4 oz (106.1 kg)    LABORATORY DATA:  I have reviewed the data as listed CMP Latest Ref Rng & Units 06/20/2020 05/28/2020 04/01/2020  Glucose 70 - 99 mg/dL 95 89 94  BUN 8 - 23 mg/dL $Remove'10 14 11  'ddUVRJX$ Creatinine 0.44 - 1.00 mg/dL 1.04(H) 1.03(H) 1.05(H)  Sodium 135 - 145 mmol/L 138 140 140  Potassium 3.5 - 5.1 mmol/L 5.3(H) 4.7 4.4  Chloride 98 - 111 mmol/L 102 103 103  CO2 22 - 32 mmol/L $RemoveB'31 26 30  'jYpVNIoU$ Calcium 8.9 - 10.3 mg/dL 9.3 9.2 9.2  Total Protein 6.5 - 8.1 g/dL 6.8 7.3 6.1  Total Bilirubin 0.3 - 1.2 mg/dL 0.7 0.4 0.5  Alkaline Phos 38 - 126 U/L 66 81 -  AST 15 - 41 U/L 33 22 16  ALT 0 - 44 U/L 43  26 15    Lab Results  Component Value Date   WBC 4.5 07/17/2020   HGB 14.7 07/17/2020   HCT 45.5 (H) 07/17/2020   MCV 95.8 07/17/2020   PLT 250 07/17/2020   NEUTROABS 1,935 07/17/2020    ASSESSMENT & PLAN:  Malignant neoplasm of upper-outer quadrant of right breast in female, estrogen receptor positive ([place]) 05/21/2020: Screening mammogram showed a possible mass in the right breast. Diagnostic mammogram and US showed a 0.5cm mass at the 12 o'clock position in the right breast, with no right axillary abnormalities. Biopsy showed invasive and in situ ductal carcinoma, grade 1, HER-2 equivocal by IHC, negative by FISH (ratio 1.22), ER+ 95%, PR+ 90%, Ki67 10%.    06/26/20: Rt Lumpectomy: Grade 1 IDC with DCIS 0/2 LN neg, Margins neg, ER+ 95%, PR+ 90%, Ki67 10%. , Her 2 Neg  Treatment Plan 1. Adjuvant XRT: Patient initially did not want to do radiation but now she is interested in radiation.  Therefore I sent a message to Dr. Sondra Come to see her and initiate radiation therapy. 2. Followed by Adj Anti estrogen therapy with anastrozole 1 mg daily x5 years Patient has rheumatoid arthritis and  is very concerned about the risks and benefits of anastrozole therapy.   We will consider starting her at a low dose and see if she tolerates it when she begins.  Return to clinic after radiation is complete    No orders of the defined types were placed in this encounter.  The patient has a good understanding of the overall plan. she agrees with it. she will call with any problems that may develop before the next visit here.  Total time spent: 20 mins including face to face time and time spent for planning, charting and coordination of care  Rulon Eisenmenger, Margaret Beasley, MPH 08/14/2020  I, Reinaldo Raddle, am acting as scribe for Dr. Nicholas Lose, Margaret Beasley.  I have reviewed the above documentation for accuracy and completeness, and I agree with the above.

## 2020-08-14 ENCOUNTER — Other Ambulatory Visit: Payer: Self-pay | Admitting: *Deleted

## 2020-08-14 ENCOUNTER — Encounter: Payer: Self-pay | Admitting: *Deleted

## 2020-08-14 ENCOUNTER — Inpatient Hospital Stay: Payer: 59 | Attending: Hematology and Oncology | Admitting: Hematology and Oncology

## 2020-08-14 ENCOUNTER — Other Ambulatory Visit: Payer: Self-pay

## 2020-08-14 DIAGNOSIS — Z170 Estrogen receptor positive status [ER+]: Secondary | ICD-10-CM | POA: Diagnosis not present

## 2020-08-14 DIAGNOSIS — C50411 Malignant neoplasm of upper-outer quadrant of right female breast: Secondary | ICD-10-CM

## 2020-08-14 DIAGNOSIS — Z79899 Other long term (current) drug therapy: Secondary | ICD-10-CM | POA: Diagnosis not present

## 2020-08-14 NOTE — Assessment & Plan Note (Signed)
05/21/2020:Screening mammogram showed a possible mass in the right breast. Diagnostic mammogram and US showed a 0.5cm mass at the 12 o'clock position in the right breast, with no right axillary abnormalities. Biopsy showed invasive and in situ ductal carcinoma, grade 1, HER-2 equivocal by IHC, negative by FISH (ratio 1.22), ER+ 95%, PR+ 90%, Ki67 10%.  06/26/20: Rt Lumpectomy: Grade 1 IDC with DCIS 0/2 LN neg, Margins neg, ER+ 95%, PR+ 90%, Ki67 10%., Her 2 Neg  Treatment Plan 1. Adjuvant XRT 2. Followed by Adj Anti estrogen therapy with anastrozole 1 mg daily x5 years Patient has rheumatoid arthritis and is very concerned about the risks and benefits of anastrozole therapy.  I discussed with her that we will make a decision after going through all the risks and benefits after she completes with radiation.  Return to clinic in 3 months for survivorship care plan visit

## 2020-08-20 ENCOUNTER — Other Ambulatory Visit: Payer: Self-pay

## 2020-08-20 ENCOUNTER — Ambulatory Visit
Admission: RE | Admit: 2020-08-20 | Discharge: 2020-08-20 | Disposition: A | Discharge status: Home / Self Care | Payer: 59 | Source: Ambulatory Visit | Attending: Radiation Oncology | Admitting: Radiation Oncology

## 2020-08-20 DIAGNOSIS — Z510 Encounter for antineoplastic radiation therapy: Secondary | ICD-10-CM | POA: Insufficient documentation

## 2020-08-20 DIAGNOSIS — C50411 Malignant neoplasm of upper-outer quadrant of right female breast: Secondary | ICD-10-CM | POA: Insufficient documentation

## 2020-08-20 DIAGNOSIS — Z170 Estrogen receptor positive status [ER+]: Secondary | ICD-10-CM

## 2020-08-24 ENCOUNTER — Other Ambulatory Visit: Payer: Self-pay | Admitting: Family Medicine

## 2020-08-25 ENCOUNTER — Encounter: Payer: Self-pay | Admitting: *Deleted

## 2020-08-26 ENCOUNTER — Telehealth: Payer: Self-pay

## 2020-08-26 ENCOUNTER — Other Ambulatory Visit: Payer: Self-pay

## 2020-08-26 ENCOUNTER — Ambulatory Visit
Admission: RE | Admit: 2020-08-26 | Discharge: 2020-08-26 | Disposition: A | Discharge status: Home / Self Care | Payer: 59 | Source: Ambulatory Visit | Attending: Radiation Oncology | Admitting: Radiation Oncology

## 2020-08-26 DIAGNOSIS — C50411 Malignant neoplasm of upper-outer quadrant of right female breast: Secondary | ICD-10-CM

## 2020-08-26 DIAGNOSIS — Z510 Encounter for antineoplastic radiation therapy: Secondary | ICD-10-CM | POA: Diagnosis not present

## 2020-08-26 DIAGNOSIS — Z170 Estrogen receptor positive status [ER+]: Secondary | ICD-10-CM

## 2020-08-26 MED ORDER — RADIAPLEXRX EX GEL: Freq: Two times a day (BID) | CUTANEOUS | Status: DC

## 2020-08-26 MED ORDER — ALRA NON-METALLIC DEODORANT (RAD-ONC)
1.0000 "application " | Freq: Once | TOPICAL | Status: AC
  Administered 2020-08-26: 1 via TOPICAL

## 2020-08-26 NOTE — Telephone Encounter (Signed)
Pt advised that form was completed and she can p/u at front desk

## 2020-08-26 NOTE — Progress Notes (Signed)
Pt here for patient teaching.  Pt given Radiation and You booklet, skin care instructions, Alra deodorant, and Radiaplex gel.  Reviewed areas of pertinence such as fatigue, skin changes, breast tenderness, and breast swelling . Pt able to give teach back of to pat skin and use unscented/gentle soap,apply Radiaplex bid, apply Sonafine bid, avoid applying anything to skin within 4 hours of treatment, avoid wearing an under wire bra, and to use an electric razor if they must shave. Pt verbalizes understanding of information given and will contact nursing with any questions or concerns.     [email or web address]

## 2020-08-26 NOTE — Telephone Encounter (Signed)
Pt  dropped off paperwork for Disability Parking Placard. She needs you to fill this out for her and call her as soon as it's ready. Paperwork placed in message box- tvt

## 2020-08-27 ENCOUNTER — Ambulatory Visit
Admission: RE | Admit: 2020-08-27 | Discharge: 2020-08-27 | Disposition: A | Discharge status: Home / Self Care | Payer: 59 | Source: Ambulatory Visit | Attending: Radiation Oncology | Admitting: Radiation Oncology

## 2020-08-27 DIAGNOSIS — Z510 Encounter for antineoplastic radiation therapy: Secondary | ICD-10-CM | POA: Diagnosis not present

## 2020-08-28 ENCOUNTER — Ambulatory Visit
Admission: RE | Admit: 2020-08-28 | Discharge: 2020-08-28 | Disposition: A | Discharge status: Home / Self Care | Payer: 59 | Source: Ambulatory Visit | Attending: Radiation Oncology | Admitting: Radiation Oncology

## 2020-08-28 ENCOUNTER — Other Ambulatory Visit: Payer: Self-pay

## 2020-08-28 ENCOUNTER — Telehealth: Payer: Self-pay | Admitting: Hematology and Oncology

## 2020-08-28 DIAGNOSIS — Z510 Encounter for antineoplastic radiation therapy: Secondary | ICD-10-CM | POA: Diagnosis not present

## 2020-08-28 NOTE — Telephone Encounter (Signed)
Scheduled appointment per 07/11 sch msg. Patient is aware. 

## 2020-08-29 ENCOUNTER — Ambulatory Visit
Admission: RE | Admit: 2020-08-29 | Discharge: 2020-08-29 | Disposition: A | Discharge status: Home / Self Care | Payer: 59 | Source: Ambulatory Visit | Attending: Radiation Oncology | Admitting: Radiation Oncology

## 2020-08-29 DIAGNOSIS — Z510 Encounter for antineoplastic radiation therapy: Secondary | ICD-10-CM | POA: Diagnosis not present

## 2020-09-01 ENCOUNTER — Ambulatory Visit
Admission: RE | Admit: 2020-09-01 | Discharge: 2020-09-01 | Disposition: A | Discharge status: Home / Self Care | Payer: 59 | Source: Ambulatory Visit | Attending: Radiation Oncology | Admitting: Radiation Oncology

## 2020-09-01 ENCOUNTER — Other Ambulatory Visit: Payer: Self-pay

## 2020-09-01 DIAGNOSIS — Z510 Encounter for antineoplastic radiation therapy: Secondary | ICD-10-CM | POA: Diagnosis not present

## 2020-09-02 ENCOUNTER — Ambulatory Visit
Admission: RE | Admit: 2020-09-02 | Discharge: 2020-09-02 | Disposition: A | Discharge status: Home / Self Care | Payer: 59 | Source: Ambulatory Visit | Attending: Radiation Oncology | Admitting: Radiation Oncology

## 2020-09-02 DIAGNOSIS — Z510 Encounter for antineoplastic radiation therapy: Secondary | ICD-10-CM | POA: Diagnosis not present

## 2020-09-03 ENCOUNTER — Ambulatory Visit
Admission: RE | Admit: 2020-09-03 | Discharge: 2020-09-03 | Disposition: A | Discharge status: Home / Self Care | Payer: 59 | Source: Ambulatory Visit | Attending: Radiation Oncology | Admitting: Radiation Oncology

## 2020-09-03 ENCOUNTER — Other Ambulatory Visit: Payer: Self-pay

## 2020-09-03 DIAGNOSIS — Z510 Encounter for antineoplastic radiation therapy: Secondary | ICD-10-CM | POA: Diagnosis not present

## 2020-09-04 ENCOUNTER — Ambulatory Visit
Admission: RE | Admit: 2020-09-04 | Discharge: 2020-09-04 | Disposition: A | Discharge status: Home / Self Care | Payer: 59 | Source: Ambulatory Visit | Attending: Radiation Oncology | Admitting: Radiation Oncology

## 2020-09-04 DIAGNOSIS — Z510 Encounter for antineoplastic radiation therapy: Secondary | ICD-10-CM | POA: Diagnosis not present

## 2020-09-05 ENCOUNTER — Ambulatory Visit
Admission: RE | Admit: 2020-09-05 | Discharge: 2020-09-05 | Disposition: A | Discharge status: Home / Self Care | Payer: 59 | Source: Ambulatory Visit | Attending: Radiation Oncology | Admitting: Radiation Oncology

## 2020-09-05 ENCOUNTER — Other Ambulatory Visit: Payer: Self-pay

## 2020-09-05 DIAGNOSIS — Z510 Encounter for antineoplastic radiation therapy: Secondary | ICD-10-CM | POA: Diagnosis not present

## 2020-09-08 ENCOUNTER — Other Ambulatory Visit: Payer: Self-pay

## 2020-09-08 ENCOUNTER — Ambulatory Visit
Admission: RE | Admit: 2020-09-08 | Discharge: 2020-09-08 | Disposition: A | Discharge status: Home / Self Care | Payer: 59 | Source: Ambulatory Visit | Attending: Radiation Oncology | Admitting: Radiation Oncology

## 2020-09-08 DIAGNOSIS — Z510 Encounter for antineoplastic radiation therapy: Secondary | ICD-10-CM | POA: Diagnosis not present

## 2020-09-09 ENCOUNTER — Ambulatory Visit: Payer: 59

## 2020-09-09 ENCOUNTER — Ambulatory Visit
Admission: RE | Admit: 2020-09-09 | Discharge: 2020-09-09 | Disposition: A | Discharge status: Home / Self Care | Payer: 59 | Source: Ambulatory Visit | Attending: Radiation Oncology | Admitting: Radiation Oncology

## 2020-09-09 DIAGNOSIS — Z510 Encounter for antineoplastic radiation therapy: Secondary | ICD-10-CM | POA: Diagnosis not present

## 2020-09-09 DIAGNOSIS — C50411 Malignant neoplasm of upper-outer quadrant of right female breast: Secondary | ICD-10-CM

## 2020-09-09 DIAGNOSIS — Z170 Estrogen receptor positive status [ER+]: Secondary | ICD-10-CM

## 2020-09-09 MED ORDER — SONAFINE EX EMUL
1.0000 "application " | Freq: Once | CUTANEOUS | Status: AC
  Administered 2020-09-09: 1 via TOPICAL

## 2020-09-09 MED ORDER — RADIAPLEXRX EX GEL
1.0000 "application " | Freq: Once | CUTANEOUS | Status: AC
  Administered 2020-09-09: 1 via TOPICAL

## 2020-09-10 ENCOUNTER — Ambulatory Visit: Admission: RE | Admit: 2020-09-10 | Payer: 59 | Source: Ambulatory Visit

## 2020-09-10 ENCOUNTER — Other Ambulatory Visit: Payer: Self-pay | Admitting: Family Medicine

## 2020-09-10 ENCOUNTER — Other Ambulatory Visit: Payer: Self-pay

## 2020-09-10 DIAGNOSIS — E039 Hypothyroidism, unspecified: Secondary | ICD-10-CM

## 2020-09-11 ENCOUNTER — Ambulatory Visit
Admission: RE | Admit: 2020-09-11 | Discharge: 2020-09-11 | Disposition: A | Discharge status: Home / Self Care | Payer: 59 | Source: Ambulatory Visit | Attending: Radiation Oncology | Admitting: Radiation Oncology

## 2020-09-11 DIAGNOSIS — Z510 Encounter for antineoplastic radiation therapy: Secondary | ICD-10-CM | POA: Diagnosis not present

## 2020-09-12 ENCOUNTER — Other Ambulatory Visit: Payer: Self-pay

## 2020-09-12 ENCOUNTER — Ambulatory Visit
Admission: RE | Admit: 2020-09-12 | Discharge: 2020-09-12 | Disposition: A | Discharge status: Home / Self Care | Payer: 59 | Source: Ambulatory Visit | Attending: Radiation Oncology | Admitting: Radiation Oncology

## 2020-09-12 DIAGNOSIS — Z510 Encounter for antineoplastic radiation therapy: Secondary | ICD-10-CM | POA: Diagnosis not present

## 2020-09-15 ENCOUNTER — Ambulatory Visit
Admission: RE | Admit: 2020-09-15 | Discharge: 2020-09-15 | Disposition: A | Discharge status: Home / Self Care | Payer: 59 | Source: Ambulatory Visit | Attending: Radiation Oncology | Admitting: Radiation Oncology

## 2020-09-15 ENCOUNTER — Other Ambulatory Visit: Payer: Self-pay

## 2020-09-15 DIAGNOSIS — Z510 Encounter for antineoplastic radiation therapy: Secondary | ICD-10-CM | POA: Diagnosis not present

## 2020-09-15 DIAGNOSIS — C50411 Malignant neoplasm of upper-outer quadrant of right female breast: Secondary | ICD-10-CM | POA: Insufficient documentation

## 2020-09-15 DIAGNOSIS — Z170 Estrogen receptor positive status [ER+]: Secondary | ICD-10-CM | POA: Insufficient documentation

## 2020-09-16 ENCOUNTER — Ambulatory Visit
Admission: RE | Admit: 2020-09-16 | Discharge: 2020-09-16 | Disposition: A | Discharge status: Home / Self Care | Payer: 59 | Source: Ambulatory Visit | Attending: Radiation Oncology | Admitting: Radiation Oncology

## 2020-09-16 DIAGNOSIS — Z510 Encounter for antineoplastic radiation therapy: Secondary | ICD-10-CM | POA: Diagnosis not present

## 2020-09-17 ENCOUNTER — Other Ambulatory Visit: Payer: Self-pay

## 2020-09-17 ENCOUNTER — Ambulatory Visit
Admission: RE | Admit: 2020-09-17 | Discharge: 2020-09-17 | Disposition: A | Discharge status: Home / Self Care | Payer: 59 | Source: Ambulatory Visit | Attending: Radiation Oncology | Admitting: Radiation Oncology

## 2020-09-17 DIAGNOSIS — Z510 Encounter for antineoplastic radiation therapy: Secondary | ICD-10-CM | POA: Diagnosis not present

## 2020-09-18 ENCOUNTER — Ambulatory Visit
Admission: RE | Admit: 2020-09-18 | Discharge: 2020-09-18 | Disposition: A | Discharge status: Home / Self Care | Payer: 59 | Source: Ambulatory Visit | Attending: Radiation Oncology | Admitting: Radiation Oncology

## 2020-09-18 DIAGNOSIS — Z510 Encounter for antineoplastic radiation therapy: Secondary | ICD-10-CM | POA: Diagnosis not present

## 2020-09-19 ENCOUNTER — Other Ambulatory Visit: Payer: Self-pay

## 2020-09-19 ENCOUNTER — Ambulatory Visit
Admission: RE | Admit: 2020-09-19 | Discharge: 2020-09-19 | Disposition: A | Discharge status: Home / Self Care | Payer: 59 | Source: Ambulatory Visit | Attending: Radiation Oncology | Admitting: Radiation Oncology

## 2020-09-19 DIAGNOSIS — Z510 Encounter for antineoplastic radiation therapy: Secondary | ICD-10-CM | POA: Diagnosis not present

## 2020-09-22 ENCOUNTER — Ambulatory Visit
Admission: RE | Admit: 2020-09-22 | Discharge: 2020-09-22 | Disposition: A | Discharge status: Home / Self Care | Payer: 59 | Source: Ambulatory Visit | Attending: Radiation Oncology | Admitting: Radiation Oncology

## 2020-09-22 ENCOUNTER — Ambulatory Visit: Payer: 59

## 2020-09-22 ENCOUNTER — Encounter: Payer: Self-pay | Admitting: *Deleted

## 2020-09-22 ENCOUNTER — Other Ambulatory Visit: Payer: Self-pay

## 2020-09-22 DIAGNOSIS — Z510 Encounter for antineoplastic radiation therapy: Secondary | ICD-10-CM | POA: Diagnosis not present

## 2020-09-22 NOTE — Progress Notes (Signed)
Patient Care Team: [name], MD as PCP - General (Family Medicine) Penny Pia (Inactive) as Referring Physician (Neurology) Mauro Kaufmann, RN as Oncology Nurse Navigator Rockwell Germany, RN as Oncology Nurse Navigator [place], Marcello Moores, MD as Consulting Physician (General Surgery) Nicholas Lose, MD as Consulting Physician (Hematology and Oncology) Gery Pray, MD as Consulting Physician (Radiation Oncology)  DIAGNOSIS:    ICD-10-CM   1. Malignant neoplasm of upper-outer quadrant of right breast in female, estrogen receptor positive ([place])  C50.411    Z17.0       SUMMARY OF ONCOLOGIC HISTORY: Oncology History  Malignant neoplasm of upper-outer quadrant of right breast in female, estrogen receptor positive ([place])  05/21/2020 Initial Diagnosis   Screening mammogram showed a possible mass in the right breast. Diagnostic mammogram and US showed a 0.5cm mass at the 12 o'clock position in the right breast, with no right axillary abnormalities. Biopsy showed invasive and in situ ductal carcinoma, grade 1, HER-2 equivocal by IHC, negative by FISH (ratio 1.22), ER+ 95%, PR+ 90%, Ki67 10%.    05/28/2020 Cancer Staging   Staging form: Breast, AJCC 8th Edition - Clinical stage from 05/28/2020: Stage IA (cT1a, cN0, cM0, G2, ER+, PR+, HER2-) - Signed by Nicholas Lose, MD on 05/28/2020  Stage prefix: Initial diagnosis  Method of lymph node assessment: Clinical  Histologic grading system: 3 grade system    06/04/2020 Genetic Testing   Negative hereditary cancer genetic testing: no pathogenic variants detected in [facility] Multi-Cancer Panel.  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  The report date is June 04, 2020.   The Multi-Cancer Panel offered by [facility] includes sequencing and/or deletion duplication testing of the following 84 genes: AIP, ALK, APC, ATM, AXIN2,BAP1,  BARD1, BLM, BMPR1A, BRCA1, BRCA2, BRIP1, CASR, CDC73, CDH1, CDK4, CDKN1B,  CDKN1C, CDKN2A (p14ARF), CDKN2A (p16INK4a), CEBPA, CHEK2, CTNNA1, DICER1, DIS3L2, EGFR (c.2369C>T, p.Thr790Met variant only), EPCAM (Deletion/duplication testing only), FH, FLCN, GATA2, GPC3, GREM1 (Promoter region deletion/duplication testing only), HOXB13 (c.251G>A, p.Gly84Glu), HRAS, KIT, MAX, MEN1, MET, MITF (c.952G>A, p.Glu318Lys variant only), MLH1, MSH2, MSH3, MSH6, MUTYH, NBN, NF1, NF2, NTHL1, PALB2, PDGFRA, PHOX2B, PMS2, POLD1, POLE, POT1, PRKAR1A, PTCH1, PTEN, RAD50, RAD51C, RAD51D, RB1, RECQL4, RET, RUNX1, SDHAF2, SDHA (sequence changes only), SDHB, SDHC, SDHD, SMAD4, SMARCA4, SMARCB1, SMARCE1, STK11, SUFU, TERC, TERT, TMEM127, TP53, TSC1, TSC2, VHL, WRN and WT1.    06/26/2020 Surgery   Right lumpectomy ([place]): IDC, grade 1, 0.2cm, with intermediate grade DCIS, and 2 right axillary lymph nodes negative for carcinoma.     CHIEF COMPLIANT: Follow-up of right breast cancer  INTERVAL HISTORY: Margaret Beasley is a 65 y.o. with above-mentioned history of right breast cancer having undergone lumpectomy, currently undergoing radiation therapy. She reports to the clinic today for follow-up.  Tomorrow is the last day of radiation and she is excited to be able to finish it.  She is here to discuss her of antiestrogen therapy.  ALLERGIES:  is allergic to opium, topiramate, benadryl [diphenhydramine hcl], butorphanol, celecoxib, latex, penicillins, gabapentin, oxycodone-acetaminophen, requip [ropinirole], stadol [butorphanol tartrate], topamax, and tramadol.  MEDICATIONS:  Current Outpatient Medications  Medication Sig Dispense Refill   DULoxetine (CYMBALTA) 30 MG capsule Take 3 capsules (90 mg total) by mouth daily. **LUPIN MFR**  **PATIENT NEEDS OFFICE VISIT FOR ADDITIONAL REFILLS** 90 capsule 0   folic acid (FOLVITE) 1 MG tablet Take 1 tablet by mouth daily.     HYDROcodone-acetaminophen (NORCO/VICODIN) 5-325 MG tablet Take 1 tablet by mouth every 6 (six)  hours as needed for moderate pain.  (Patient not taking: Reported on 07/28/2020) 15 tablet 0   lamoTRIgine (LAMICTAL) 100 MG tablet Take 100 mg by mouth every evening.     levETIRAcetam (KEPPRA XR) 500 MG 24 hr tablet 4 (four) times daily.      levothyroxine (SYNTHROID) 50 MCG tablet TAKE 1 TABLET BY MOUTH EVERY DAY BEFORE BREAKFAST 90 tablet 1   methotrexate (RHEUMATREX) 5 MG tablet Take 5 mg by mouth once a week. Caution: Chemotherapy. Protect from light. (Patient not taking: Reported on 07/28/2020)     promethazine (PHENERGAN) 25 MG suppository Place 1 suppository (25 mg total) rectally every 6 (six) hours as needed for nausea or vomiting. 12 each 0   simvastatin (ZOCOR) 40 MG tablet TAKE 1 TABLET BY MOUTH EVERYDAY AT BEDTIME 90 tablet 3   SUMAtriptan (IMITREX) 100 MG tablet TAKE 1 TABLET (100 MG TOTAL) BY MOUTH EVERY 2 (TWO) HOURS AS NEEDED FOR MIGRAINE. 30 tablet 3   No current facility-administered medications for this visit.    PHYSICAL EXAMINATION: ECOG PERFORMANCE STATUS: 1 - Symptomatic but completely ambulatory  Vitals:   09/23/20 1339  BP: 137/74  Pulse: 90  Resp: 18  Temp: 97.8 F (36.6 C)  SpO2: 95%   Filed Weights   09/23/20 1339  Weight: 234 lb 6.4 oz (106.3 kg)      LABORATORY DATA:  I have reviewed the data as listed CMP Latest Ref Rng & Units 06/20/2020 05/28/2020 04/01/2020  Glucose 70 - 99 mg/dL 95 89 94  BUN 8 - 23 mg/dL _0 Creatinine 0.44 - 1.00 mg/dL 1.04(H) 1.03(H) 1.05(H)  Sodium 135 - 145 mmol/L 138 140 140  Potassium 3.5 - 5.1 mmol/L 5.3(H) 4.7 4.4  Chloride 98 - 111 mmol/L 102 103 103  CO2 22 - 32 mmol/L _1 Calcium 8.9 - 10.3 mg/dL 9.3 9.2 9.2  Total Protein 6.5 - 8.1 g/dL 6.8 7.3 6.1  Total Bilirubin 0.3 - 1.2 mg/dL 0.7 0.4 0.5  Alkaline Phos 38 - 126 U/L 66 81 -  AST 15 - 41 U/L 33 22 16  ALT 0 - 44 U/L 43 26 15    Lab Results  Component Value Date   WBC 4.5 07/17/2020   HGB 14.7 07/17/2020   HCT 45.5 (H) 07/17/2020   MCV 95.8 07/17/2020   PLT 250 07/17/2020    NEUTROABS 1,935 07/17/2020    ASSESSMENT & PLAN:  Malignant neoplasm of upper-outer quadrant of right breast in female, estrogen receptor positive ([place]) 05/21/2020: Screening mammogram showed a possible mass in the right breast. Diagnostic mammogram and US showed a 0.5cm mass at the 12 o'clock position in the right breast, with no right axillary abnormalities. Biopsy showed invasive and in situ ductal carcinoma, grade 1, HER-2 equivocal by IHC, negative by FISH (ratio 1.22), ER+ 95%, PR+ 90%, Ki67 10%.    06/26/20: Rt Lumpectomy: Grade 1 IDC with DCIS 0/2 LN neg, Margins neg, ER+ 95%, PR+ 90%, Ki67 10%. , Her 2 Neg   Treatment Plan 1. Adjuvant XRT: To be completed 09/24/2020 2. Followed by Adj Anti estrogen therapy with anastrozole 1 mg daily versus tamoxifen x5 years   We will repeat the bone density test in 1 to 2 weeks and a home telephone visit after that to discuss results so we can decide on the appropriate antiestrogen treatment plan.      No orders of the defined types were placed in this encounter.  The patient  has a good understanding of the overall plan. she agrees with it. she will call with any problems that may develop before the next visit here.  Total time spent: 30 mins including face to face time and time spent for planning, charting and coordination of care  Rulon Eisenmenger, MD, MPH 09/23/2020  I, Thana Ates, am acting as scribe for Dr. Nicholas Lose.  I have reviewed the above documentation for accuracy and completeness, and I agree with the above.

## 2020-09-23 ENCOUNTER — Ambulatory Visit
Admission: RE | Admit: 2020-09-23 | Discharge: 2020-09-23 | Disposition: A | Discharge status: Home / Self Care | Payer: 59 | Source: Ambulatory Visit | Attending: Radiation Oncology | Admitting: Radiation Oncology

## 2020-09-23 ENCOUNTER — Inpatient Hospital Stay: Payer: 59 | Attending: Hematology and Oncology | Admitting: Hematology and Oncology

## 2020-09-23 VITALS — BP 137/74 | HR 90 | Temp 97.8°F | Resp 18 | Ht 63.0 in | Wt 234.4 lb

## 2020-09-23 DIAGNOSIS — Z510 Encounter for antineoplastic radiation therapy: Secondary | ICD-10-CM | POA: Diagnosis not present

## 2020-09-23 DIAGNOSIS — Z780 Asymptomatic menopausal state: Secondary | ICD-10-CM | POA: Diagnosis not present

## 2020-09-23 DIAGNOSIS — C50411 Malignant neoplasm of upper-outer quadrant of right female breast: Secondary | ICD-10-CM | POA: Insufficient documentation

## 2020-09-23 DIAGNOSIS — Z170 Estrogen receptor positive status [ER+]: Secondary | ICD-10-CM | POA: Diagnosis not present

## 2020-09-23 NOTE — Assessment & Plan Note (Signed)
05/21/2020:Screening mammogram showed a possible mass in the right breast. Diagnostic mammogram and US showed a 0.5cm mass at the 12 o'clock position in the right breast, with no right axillary abnormalities. Biopsy showed invasive and in situ ductal carcinoma, grade 1, HER-2 equivocal by IHC, negative by FISH (ratio 1.22), ER+ 95%, PR+ 90%, Ki67 10%.  06/26/20: Rt Lumpectomy: Grade 1 IDC with DCIS 0/2 LN neg, Margins neg,ER+ 95%, PR+ 90%, Ki67 10%., Her 2 Neg  Treatment Plan 1. Adjuvant XRT: Patient initially did not want to do radiation. 2. Followed by Adj Anti estrogen therapywith anastrozole 1 mg daily x5 years Patient has rheumatoid arthritis and is very concerned about the risks and benefits of anastrozole therapy.  We will consider starting her at a low dose and see if she tolerates it when she begins.

## 2020-09-24 ENCOUNTER — Encounter: Payer: Self-pay | Admitting: Radiation Oncology

## 2020-09-24 ENCOUNTER — Ambulatory Visit
Admission: RE | Admit: 2020-09-24 | Discharge: 2020-09-24 | Disposition: A | Discharge status: Home / Self Care | Payer: 59 | Source: Ambulatory Visit | Attending: Radiation Oncology | Admitting: Radiation Oncology

## 2020-09-24 DIAGNOSIS — Z923 Personal history of irradiation: Secondary | ICD-10-CM

## 2020-09-24 DIAGNOSIS — Z510 Encounter for antineoplastic radiation therapy: Secondary | ICD-10-CM | POA: Diagnosis not present

## 2020-09-24 HISTORY — DX: Personal history of irradiation: Z92.3

## 2020-09-30 ENCOUNTER — Ambulatory Visit (HOSPITAL_BASED_OUTPATIENT_CLINIC_OR_DEPARTMENT_OTHER)
Admission: RE | Admit: 2020-09-30 | Discharge: 2020-09-30 | Disposition: A | Discharge status: Home / Self Care | Payer: 59 | Source: Ambulatory Visit | Attending: Hematology and Oncology | Admitting: Hematology and Oncology

## 2020-09-30 ENCOUNTER — Other Ambulatory Visit: Payer: Self-pay

## 2020-09-30 DIAGNOSIS — Z780 Asymptomatic menopausal state: Secondary | ICD-10-CM | POA: Diagnosis not present

## 2020-09-30 NOTE — Progress Notes (Signed)
HEMATOLOGY-ONCOLOGY TELEPHONE VISIT PROGRESS NOTE  I connected with Clarnce Flock on 10/01/2020 at 10:45 AM EDT by telephone and verified that I am speaking with the correct person using two identifiers.  I discussed the limitations, risks, security and privacy concerns of performing an evaluation and management service by telephone and the availability of in person appointments.  I also discussed with the patient that there may be a patient responsible charge related to this service. The patient expressed understanding and agreed to proceed.   History of Present Illness: Margaret Beasley is a 65 y.o. female with above-mentioned history of right breast cancer having undergone lumpectomy, currently undergoing radiation therapy. She reports via telephone today for follow-up.  Oncology History  Malignant neoplasm of upper-outer quadrant of right breast in female, estrogen receptor positive ([place])  05/21/2020 Initial Diagnosis   Screening mammogram showed a possible mass in the right breast. Diagnostic mammogram and US showed a 0.5cm mass at the 12 o'clock position in the right breast, with no right axillary abnormalities. Biopsy showed invasive and in situ ductal carcinoma, grade 1, HER-2 equivocal by IHC, negative by FISH (ratio 1.22), ER+ 95%, PR+ 90%, Ki67 10%.    05/28/2020 Cancer Staging   Staging form: Breast, AJCC 8th Edition - Clinical stage from 05/28/2020: Stage IA (cT1a, cN0, cM0, G2, ER+, PR+, HER2-) - Signed by Nicholas Lose, MD on 05/28/2020 Stage prefix: Initial diagnosis Method of lymph node assessment: Clinical Histologic grading system: 3 grade system   06/04/2020 Genetic Testing   Negative hereditary cancer genetic testing: no pathogenic variants detected in [facility] Multi-Cancer Panel.  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  The report date is June 04, 2020.   The Multi-Cancer Panel offered by [facility] includes sequencing and/or deletion  duplication testing of the following 84 genes: AIP, ALK, APC, ATM, AXIN2,BAP1,  BARD1, BLM, BMPR1A, BRCA1, BRCA2, BRIP1, CASR, CDC73, CDH1, CDK4, CDKN1B, CDKN1C, CDKN2A (p14ARF), CDKN2A (p16INK4a), CEBPA, CHEK2, CTNNA1, DICER1, DIS3L2, EGFR (c.2369C>T, p.Thr790Met variant only), EPCAM (Deletion/duplication testing only), FH, FLCN, GATA2, GPC3, GREM1 (Promoter region deletion/duplication testing only), HOXB13 (c.251G>A, p.Gly84Glu), HRAS, KIT, MAX, MEN1, MET, MITF (c.952G>A, p.Glu318Lys variant only), MLH1, MSH2, MSH3, MSH6, MUTYH, NBN, NF1, NF2, NTHL1, PALB2, PDGFRA, PHOX2B, PMS2, POLD1, POLE, POT1, PRKAR1A, PTCH1, PTEN, RAD50, RAD51C, RAD51D, RB1, RECQL4, RET, RUNX1, SDHAF2, SDHA (sequence changes only), SDHB, SDHC, SDHD, SMAD4, SMARCA4, SMARCB1, SMARCE1, STK11, SUFU, TERC, TERT, TMEM127, TP53, TSC1, TSC2, VHL, WRN and WT1.    06/26/2020 Surgery   Right lumpectomy ([place]): IDC, grade 1, 0.2cm, with intermediate grade DCIS, and 2 right axillary lymph nodes negative for carcinoma.     Observations/Objective:     Assessment Plan:  Malignant neoplasm of upper-outer quadrant of right breast in female, estrogen receptor positive ([place]) 05/21/2020: Screening mammogram showed a possible mass in the right breast. Diagnostic mammogram and US showed a 0.5cm mass at the 12 o'clock position in the right breast, with no right axillary abnormalities. Biopsy showed invasive and in situ ductal carcinoma, grade 1, HER-2 equivocal by IHC, negative by FISH (ratio 1.22), ER+ 95%, PR+ 90%, Ki67 10%.    06/26/20: Rt Lumpectomy: Grade 1 IDC with DCIS 0/2 LN neg, Margins neg, ER+ 95%, PR+ 90%, Ki67 10%. , Her 2 Neg   Treatment Plan 1. Adjuvant XRT: completed 09/24/2020 2. Followed by Adj Anti estrogen therapy with anastrozole 1 mg daily versus tamoxifen x5 years Bone density has been performed but the results are not available. Therefore we will have to  postpone the decision for anastrozole versus tamoxifen until the  results are available. I will call her when these results become available.   I discussed the assessment and treatment plan with the patient. The patient was provided an opportunity to ask questions and all were answered. The patient agreed with the plan and demonstrated an understanding of the instructions. The patient was advised to call back or seek an in-person evaluation if the symptoms worsen or if the condition fails to improve as anticipated.   Total time spent: 5 mins including non-face to face time and time spent for planning, charting and coordination of care  Rulon Eisenmenger, MD 10/01/2020    I, Thana Ates, am acting as scribe for Nicholas Lose, MD.  I have reviewed the above documentation for accuracy and completeness, and I agree with the above.

## 2020-09-30 NOTE — Assessment & Plan Note (Signed)
05/21/2020:Screening mammogram showed a possible mass in the right breast. Diagnostic mammogram and US showed a 0.5cm mass at the 12 o'clock position in the right breast, with no right axillary abnormalities. Biopsy showed invasive and in situ ductal carcinoma, grade 1, HER-2 equivocal by IHC, negative by FISH (ratio 1.22), ER+ 95%, PR+ 90%, Ki67 10%.  06/26/20: Rt Lumpectomy: Grade 1 IDC with DCIS 0/2 LN neg, Margins neg,ER+ 95%, PR+ 90%, Ki67 10%., Her 2 Neg  Treatment Plan 1. Adjuvant XRT: To be completed 09/24/2020 2. Followed by Adj Anti estrogen therapywith anastrozole 1 mg daily versus tamoxifen x5 years   We will repeat the bone density test in 1 to 2 weeks and a home telephone visit after that to discuss results so we can decide on the appropriate antiestrogen treatment plan.

## 2020-10-01 ENCOUNTER — Inpatient Hospital Stay (HOSPITAL_BASED_OUTPATIENT_CLINIC_OR_DEPARTMENT_OTHER): Payer: 59 | Admitting: Hematology and Oncology

## 2020-10-01 DIAGNOSIS — C50411 Malignant neoplasm of upper-outer quadrant of right female breast: Secondary | ICD-10-CM | POA: Diagnosis not present

## 2020-10-01 DIAGNOSIS — Z170 Estrogen receptor positive status [ER+]: Secondary | ICD-10-CM

## 2020-10-06 ENCOUNTER — Other Ambulatory Visit: Payer: Self-pay

## 2020-10-06 ENCOUNTER — Ambulatory Visit: Payer: 59 | Attending: Surgery

## 2020-10-06 VITALS — Wt 232.2 lb

## 2020-10-06 DIAGNOSIS — Z483 Aftercare following surgery for neoplasm: Secondary | ICD-10-CM | POA: Insufficient documentation
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
## 2020-10-09 ENCOUNTER — Other Ambulatory Visit: Payer: Self-pay

## 2020-10-09 ENCOUNTER — Ambulatory Visit (INDEPENDENT_AMBULATORY_CARE_PROVIDER_SITE_OTHER): Payer: 59 | Admitting: Family Medicine

## 2020-10-09 ENCOUNTER — Encounter: Payer: Self-pay | Admitting: Family Medicine

## 2020-10-09 VITALS — BP 152/85 | HR 78 | Ht 63.0 in | Wt 235.4 lb

## 2020-10-09 DIAGNOSIS — R21 Rash and other nonspecific skin eruption: Secondary | ICD-10-CM | POA: Diagnosis not present
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
## 2020-10-09 NOTE — Patient Instructions (Signed)
Continue gentle cleansers and lotions - Eucerin, Cetaphil, Dove Sensitive Skin, etc Warm compresses

## 2020-10-14 ENCOUNTER — Telehealth: Payer: Self-pay

## 2020-10-14 MED ORDER — ANASTROZOLE 1 MG PO TABS: 1.0000 mg | ORAL_TABLET | Freq: Every day | ORAL | 0 refills | Status: DC

## 2020-10-14 NOTE — Telephone Encounter (Signed)
MD reviewed bone density scan results.  Recommendations for Anastrozole '1mg'$  tablet daily #90.   RN notified patient, pt then verbalized she resumed Methotrexate last Wed.   RN reviewed with MD, okay to proceed with Anastrozole based on evidence based research.  Pt verbalized understanding and agreement.  RN educated patient on side effects of anastrozole.

## 2020-10-24 ENCOUNTER — Ambulatory Visit
Admission: RE | Admit: 2020-10-24 | Discharge: 2020-10-24 | Disposition: A | Discharge status: Home / Self Care | Payer: 59 | Source: Ambulatory Visit | Attending: Radiation Oncology | Admitting: Radiation Oncology

## 2020-10-24 ENCOUNTER — Encounter: Payer: Self-pay | Admitting: Radiation Oncology

## 2020-10-24 ENCOUNTER — Other Ambulatory Visit: Payer: Self-pay

## 2020-10-24 VITALS — BP 134/68 | HR 85 | Temp 98.6°F | Resp 18 | Ht 63.0 in | Wt 235.5 lb

## 2020-10-24 DIAGNOSIS — C50411 Malignant neoplasm of upper-outer quadrant of right female breast: Secondary | ICD-10-CM | POA: Diagnosis not present

## 2020-10-24 DIAGNOSIS — Z170 Estrogen receptor positive status [ER+]: Secondary | ICD-10-CM | POA: Diagnosis not present

## 2020-10-24 DIAGNOSIS — C50419 Malignant neoplasm of upper-outer quadrant of unspecified female breast: Secondary | ICD-10-CM

## 2020-10-24 DIAGNOSIS — D0512 Intraductal carcinoma in situ of left breast: Secondary | ICD-10-CM | POA: Diagnosis not present

## 2020-10-24 LAB — BASIC METABOLIC PANEL - CANCER CENTER ONLY
Anion gap: 9 (ref 5–15)
BUN: 10 mg/dL (ref 8–23)
CO2: 26 mmol/L (ref 22–32)
Calcium: 9.4 mg/dL (ref 8.9–10.3)
Chloride: 103 mmol/L (ref 98–111)
Creatinine: 1.03 mg/dL — ABNORMAL HIGH (ref 0.44–1.00)
GFR, Estimated: >60 mL/min (ref >60)
Glucose, Bld: 100 mg/dL — ABNORMAL HIGH (ref 70–99)
Potassium: 4.7 mmol/L (ref 3.5–5.1)
Sodium: 138 mmol/L (ref 135–145)

## 2020-10-24 LAB — CBC WITH DIFFERENTIAL (CANCER CENTER ONLY)
Abs Immature Granulocytes: 0 10*3/uL (ref 0.00–0.07)
Basophils Absolute: 0 10*3/uL (ref 0.0–0.1)
Basophils Relative: 0 %
Eosinophils Absolute: 0.1 10*3/uL (ref 0.0–0.5)
Eosinophils Relative: 2 %
HCT: 43.4 % (ref 36.0–46.0)
Hemoglobin: 14.2 g/dL (ref 12.0–15.0)
Immature Granulocytes: 0 %
Lymphocytes Relative: 11 %
Lymphs Abs: 0.5 10*3/uL — ABNORMAL LOW (ref 0.7–4.0)
MCH: 30.9 pg (ref 26.0–34.0)
MCHC: 32.7 g/dL (ref 30.0–36.0)
MCV: 94.6 fL (ref 80.0–100.0)
Monocytes Absolute: 0.4 10*3/uL (ref 0.1–1.0)
Monocytes Relative: 8 %
Neutro Abs: 3.7 10*3/uL (ref 1.7–7.7)
Neutrophils Relative %: 79 %
Platelet Count: 175 10*3/uL (ref 150–400)
RBC: 4.59 MIL/uL (ref 3.87–5.11)
RDW: 14.1 % (ref 11.5–15.5)
WBC Count: 4.6 10*3/uL (ref 4.0–10.5)
nRBC: 0 % (ref 0.0–0.2)

## 2020-10-24 MED ORDER — DOXYCYCLINE HYCLATE 100 MG PO TABS: 100.0000 mg | ORAL_TABLET | Freq: Two times a day (BID) | ORAL | 0 refills | Status: DC

## 2020-10-24 NOTE — Progress Notes (Addendum)
Margaret Beasley is here today for follow up post radiation to the breast.   Breast Side:right   They completed their radiation on: 09/24/2020   Does the patient complain of any of the following: Post radiation skin issues: see below Breast Tenderness: yes Breast Swelling: yes Lymphadema: denies Range of Motion limitations: demonstrates full range of motion Fatigue post radiation: severe fatigue Appetite good/fair/poor: fair  Additional comments if applicable: Patient reports her skin returned to normal about 2 weeks following treatment. yesterday she developed back pain, intense sweating, breast tenderness and very reddened skin in treatment area. Breast became firm and hot to the touch. Nipple is painful. Dimples have developed making breast "look like an orange peel". Has been using Nivea cream which was effective in returning skin to normal. She also reports severe fatigue, nausea and migraines.  Vitals:   10/24/20 0924  BP: 134/68  Pulse: 85  Resp: 18  Temp: 98.6 F (37 C)  SpO2: 96%  Weight: 235 lb 8 oz (106.8 kg)  Height: '5\' 3"'$  (1.6 m)

## 2020-10-24 NOTE — Progress Notes (Signed)
Radiation Oncology         [phone] ________________________________  Name: Margaret Beasley MRN: [id number]  Date: 10/24/2020  DOB: 11/29/1955  Follow-Up Visit Note  CC: Margaret Marry, MD  Margaret Lose, MD    ICD-10-CM   1. Malignant neoplasm of upper-outer quadrant of right breast in female, estrogen receptor positive ([place])  C50.411    Z17.0     2. Malignant neoplasm of upper-outer quadrant of female breast, unspecified estrogen receptor status, unspecified laterality ([place])  C50.419 CBC with Differential ([place] Only      Diagnosis: Stage IA, cT1aN0M0 Right Breast UOQ, Invasive Ductal Carcinoma with DCIS, ER+ / PR+ / Her2-, Grade 1-2  Interval Since Last Radiation:  1 month   Intent: Curative  Radiation Treatment Dates: 08/26/2020 through 09/24/2020 Site Technique Total Dose (Gy) Dose per Fx (Gy) Completed Fx Beam Energies  Breast, Right: Breast_Rt 3D 40.05/40.05 2.67 15/15 10X, 15X  Breast, Right: Breast_Rt_Bst 3D 12/12 2 6/6 6X, 15X   Narrative:  The patient returns today for routine 1 month follow-up after completion of her radiation treatment on 09/24/20. Near the end of her treatment she reported tenderness underneath her right breast; with desquamation noted to this area and to the right axilla. Redness was also noted to her right breast, chest, and axilla, as well as a rash between both breasts. (She reported some left breast tenderness for one day which did not reoccur). Other than the above symptoms, she tolerated treatment relatively well, demonstrating full ROM and reporting a healthy appetite.             Pertinent imaging since she was last seen includes DXA for bone density performed on 09/30/20 revealing a ten year risk of major osteoporotic fracture of 7.7%, and and ten year risk of hip fracture of 0.7%.   The patient followed up with Dr. Lindi Beasley on 10/01/20 where he discussed the role of anastrozole  versus tamoxifen as dependant on bone density results. Overall plan discussed at that time was for Adj Anti estrogen therapy with anastrozole 1 mg daily versus tamoxifen x5 years.                   Allergies:  is allergic to opium, topiramate, benadryl [diphenhydramine hcl], butorphanol, celecoxib, latex, penicillins, gabapentin, oxycodone-acetaminophen, requip [ropinirole], stadol [butorphanol tartrate], topamax, and tramadol.  Meds: Current Outpatient Medications  Medication Sig Dispense Refill   doxycycline (VIBRA-TABS) 100 MG tablet Take 1 tablet (100 mg total) by mouth 2 (two) times daily. 14 tablet 0   DULoxetine (CYMBALTA) 30 MG capsule Take 3 capsules (90 mg total) by mouth daily. **LUPIN MFR**  **PATIENT NEEDS OFFICE VISIT FOR ADDITIONAL REFILLS** 90 capsule 0   folic acid (FOLVITE) 1 MG tablet Take 1 tablet by mouth daily.     lamoTRIgine (LAMICTAL) 100 MG tablet Take 100 mg by mouth every evening.     levETIRAcetam (KEPPRA XR) 500 MG 24 hr tablet 4 (four) times daily.      levothyroxine (SYNTHROID) 50 MCG tablet TAKE 1 TABLET BY MOUTH EVERY DAY BEFORE BREAKFAST 90 tablet 1   methotrexate (RHEUMATREX) 5 MG tablet Take 5 mg by mouth once a week. Caution: Chemotherapy. Protect from light.     promethazine (PHENERGAN) 25 MG suppository Place 1 suppository (25 mg total) rectally every 6 (six) hours as needed for nausea or vomiting. 12 each 0   simvastatin (ZOCOR) 40 MG tablet TAKE  1 TABLET BY MOUTH EVERYDAY AT BEDTIME 90 tablet 3   SUMAtriptan (IMITREX) 100 MG tablet TAKE 1 TABLET (100 MG TOTAL) BY MOUTH EVERY 2 (TWO) HOURS AS NEEDED FOR MIGRAINE. 30 tablet 3   anastrozole (ARIMIDEX) 1 MG tablet Take 1 tablet (1 mg total) by mouth daily. (Patient not taking: Reported on 10/24/2020) 90 tablet 0   HYDROcodone-acetaminophen (NORCO/VICODIN) 5-325 MG tablet Take 1 tablet by mouth every 6 (six) hours as needed for moderate pain. (Patient not taking: Reported on 10/24/2020) 15 tablet 0   No current  facility-administered medications for this encounter.    Physical Findings: The patient is in no acute distress. Patient is alert and oriented.  height is $RemoveB'5\' 3"'eDKZWaQB$  (1.6 m) and weight is 235 lb 8 oz (106.8 kg). Her temperature is 98.6 F (37 C). Her blood pressure is 134/68 and her pulse is 85. Her respiration is 18 and oxygen saturation is 96%. .  No significant changes. Lungs are clear to auscultation bilaterally. Heart has regular rate and rhythm. No palpable cervical, supraclavicular, or axillary adenopathy. Abdomen soft, non-tender, normal bowel sounds.   Left Breast: no palpable mass, nipple discharge or bleeding.  Right breast exam reveals erythema centrally with some edema.  The breast is also quite warm consistent with cellulitis.  Lab Findings: Lab Results  Component Value Date   WBC 4.6 10/24/2020   HGB 14.2 10/24/2020   HCT 43.4 10/24/2020   MCV 94.6 10/24/2020   PLT 175 10/24/2020    Radiographic Findings: DG Bone Density  Result Date: 10/03/2020 EXAM: DUAL X-RAY ABSORPTIOMETRY (DXA) FOR BONE MINERAL DENSITY IMPRESSION: Margaret Beasley Your patient Margaret Beasley completed a BMD test on 09/30/2020 using the [place] (analysis version: 16.SP2) manufactured by [employer]. The following summarizes the results of our evaluation. [facility] PATIENT: Name: Margaret Beasley Patient ID: [id number] Birth Date: Jun 20, 1955 Height: 63.0 in. Gender: Female Measured: 09/30/2020 Weight: 236.6 lbs. Indications: Caucasian, Estrogen Deficiency, Low Calcium Intake, Post Menopausal Fractures: Treatments: Levothyroxine ASSESSMENT: The BMD measured at AP Spine L2-L4 (L3) is 1.011 g/cm2 with a T-score of -1.6. This patient is considered osteopenic according to [facility]) criteria. L1 & L3 were excluded due to degenerative changes. The scan quality is good. Site Region Measured Date Measured Age WHO YA BMD Classification T-score AP Spine L2-L4 (L3) 09/30/2020 64.6 Osteopenia -1.6  1.011 g/cm2 DualFemur Neck Right 09/30/2020 64.6 years Osteopenia -1.5 0.828 g/cm2 Left Forearm Radius 33% 09/30/2020 64.6 Normal 0.5 0.918 g/cm2 World Health Organization [facility]) criteria for post-menopausal, Caucasian Women: Normal       T-score at or above -1 SD Osteopenia   T-score between -1 and -2.5 SD Osteoporosis T-score at or below -2.5 SD RECOMMENDATION: 1. All patients should optimize calcium and vitamin D intake. 2. Consider FDA-approved medical therapies in postmenopausal women and men aged 72 years and older, based on the following: a. A hip or vertebral(clinical or morphometric) fracture. b. T-score < -2.5 at the femoral neck or spine after appropriate evaluation to exclude secondary causes c. Low bone mass (T-score between -1.0 and -2.5 at the femoral neck or spine) and a 10 year probability of a hip fracture >3% or a 10 year probability of major osteoporosis-related fracture > 20% based on the [place]-adapted WHO algorithm d. Clinical judgement and/or patient preferences may indicate treatment for people with 10-year fracture probabilities above or below these levels FOLLOW-UP: Patients with diagnosis of osteoporosis or at high risk for fracture should have regular bone  mineral density tests. For patients eligible for Medicare, routine testing is allowed once every 2 years. The testing frequency can be increased to one year for patients who have rapidly progressing disease, those who are receiving or discontinuing medical therapy to restore bone mass, or have additional risk factors. FRAX: Based on femoral neck BMD (RIGHT femoral neck) Date: 09/30/2020 Ten year risk of major osteoporotic fracture: 7.7% Ten year risk of hip fracture: 0.7% Risk factors: None Population: [place] Caucasian I have reviewed this report and agree with the above findings. Mark A. Thornton Papas, M.D. [facility] Radiology Electronically Signed   By: Lavonia Dana M.D.   On: 10/03/2020 08:36    Impression:  Stage IA, cT1aN0M0 Right Breast UOQ,  Invasive Ductal Carcinoma with DCIS, ER+ / PR+ / Her2-, Grade 1-2  Patient reports her skin being well-healed and then several days ago developed redness and warmth to the right breast as well as swelling.  Findings are consistent with cellulitis.  Plan: Patient will be placed on doxycycline twice daily for 7-day course.  She will be seen approximately a week from now to assess her response to antibiotic therapy.    ____________________________________  Blair Promise, PhD, MD   This document serves as a record of services personally performed by Gery Pray, MD. It was created on his behalf by Roney Mans, a trained medical scribe. The creation of this record is based on the scribe's personal observations and the provider's statements to them. This document has been checked and approved by the attending provider.

## 2020-10-28 ENCOUNTER — Telehealth: Payer: Self-pay

## 2020-10-28 NOTE — Telephone Encounter (Signed)
Patient recently in for evaluation to right breast. Patient called to report increased redness that has extended from  right breast to throat area. Patient reports having nausea, and dizziness. Currently taking doxycyline for possible cellulitis to right breast. Patient requesting medical advice.

## 2020-10-29 ENCOUNTER — Encounter: Payer: Self-pay | Admitting: Radiation Oncology

## 2020-10-29 ENCOUNTER — Other Ambulatory Visit: Payer: Self-pay | Admitting: *Deleted

## 2020-10-29 ENCOUNTER — Other Ambulatory Visit: Payer: Self-pay

## 2020-10-29 ENCOUNTER — Ambulatory Visit
Admission: RE | Admit: 2020-10-29 | Discharge: 2020-10-29 | Disposition: A | Discharge status: Home / Self Care | Payer: 59 | Source: Ambulatory Visit | Attending: Radiation Oncology | Admitting: Radiation Oncology

## 2020-10-29 VITALS — BP 136/88 | HR 100 | Temp 97.8°F | Resp 20 | Ht 63.0 in | Wt 237.8 lb

## 2020-10-29 DIAGNOSIS — R21 Rash and other nonspecific skin eruption: Secondary | ICD-10-CM | POA: Insufficient documentation

## 2020-10-29 DIAGNOSIS — C50411 Malignant neoplasm of upper-outer quadrant of right female breast: Secondary | ICD-10-CM | POA: Insufficient documentation

## 2020-10-29 DIAGNOSIS — Z170 Estrogen receptor positive status [ER+]: Secondary | ICD-10-CM

## 2020-10-29 DIAGNOSIS — R42 Dizziness and giddiness: Secondary | ICD-10-CM | POA: Insufficient documentation

## 2020-10-29 DIAGNOSIS — Z79899 Other long term (current) drug therapy: Secondary | ICD-10-CM | POA: Diagnosis not present

## 2020-10-29 DIAGNOSIS — D0512 Intraductal carcinoma in situ of left breast: Secondary | ICD-10-CM

## 2020-10-29 DIAGNOSIS — R110 Nausea: Secondary | ICD-10-CM | POA: Insufficient documentation

## 2020-10-29 DIAGNOSIS — Z923 Personal history of irradiation: Secondary | ICD-10-CM | POA: Insufficient documentation

## 2020-10-29 MED ORDER — ALRA NON-METALLIC DEODORANT (RAD-ONC)
1.0000 "application " | Freq: Once | TOPICAL | Status: AC
  Administered 2020-10-29: 1 via TOPICAL

## 2020-10-29 NOTE — Progress Notes (Signed)
Radiation Oncology         [phone] ________________________________  Name: Margaret Beasley MRN: [id number]  Date: 10/29/2020  DOB: 30-Apr-1955  Follow-Up Visit Note  CC: [name], MD  Nicholas Lose, MD    ICD-10-CM   1. Malignant neoplasm of upper-outer quadrant of right breast in female, estrogen receptor positive ([place])  C50.411    Z17.0     2. Ductal carcinoma in situ (DCIS) of left breast  N39.76 non-metallic deodorant (ALRA) 1 application      Diagnosis: Stage IA, cT1aN0M0 Right Breast UOQ, Invasive Ductal Carcinoma with DCIS, ER+ / PR+ / Her2-, Grade 1-2  Interval Since Last Radiation: 1 month and 4 days  Intent: Curative   Radiation Treatment Dates: 08/26/2020 through 09/24/2020 Site Technique Total Dose (Gy) Dose per Fx (Gy) Completed Fx Beam Energies  Breast, Right: Breast_Rt 3D 40.05/40.05 2.67 15/15 10X, 15X  Breast, Right: Breast_Rt_Bst 3D 12/12 2 6/6 6X, 15X   Narrative:  The patient returns today for follow-up, she was last seen by [place] on 09/09 and returns today due to increased redness that has extended from her right breast to her throat area. She additionally reports having nausea and dizziness. She is currently taking doxycycline for possible cellulitis to her right breast. To review, during her follow-up visit on the 9th, the patient reported tenderness underneath her right breast; with desquamation noted to this area and to the right axilla. Redness was also noted to her right breast, chest, and axilla, as well as a rash between both breasts. (She reported some left breast tenderness for one day which did not reoccur).     DXA for bone density performed on 09/30/20 revealing a ten year risk of major osteoporotic fracture of 7.7%, and and ten year risk of hip fracture of 0.7%.   The patient followed up with Dr. Lindi Adie on 10/01/20 where he discussed the role of anastrozole versus tamoxifen as dependant on bone density results. Overall plan discussed  at that time was for Adj Anti estrogen therapy with anastrozole 1 mg daily versus tamoxifen x5 years.        She denies any chills or fever over the past several days.  She does report some dizziness but has had this for some time.  This is not a new problem.  I have recommended she follow-up with her primary care physician concerning this issue.                           Allergies:  is allergic to opium, topiramate, benadryl [diphenhydramine hcl], butorphanol, celecoxib, latex, penicillins, gabapentin, oxycodone-acetaminophen, requip [ropinirole], stadol [butorphanol tartrate], topamax, and tramadol.  Meds: Current Outpatient Medications  Medication Sig Dispense Refill   anastrozole (ARIMIDEX) 1 MG tablet Take 1 tablet (1 mg total) by mouth daily. (Patient not taking: Reported on 10/24/2020) 90 tablet 0   doxycycline (VIBRA-TABS) 100 MG tablet Take 1 tablet (100 mg total) by mouth 2 (two) times daily. 6 tablet 0   DULoxetine (CYMBALTA) 30 MG capsule Take 3 capsules (90 mg total) by mouth daily. **LUPIN MFR**  **PATIENT NEEDS OFFICE VISIT FOR ADDITIONAL REFILLS** 90 capsule 0   folic acid (FOLVITE) 1 MG tablet Take 1 tablet by mouth daily.     HYDROcodone-acetaminophen (NORCO/VICODIN) 5-325 MG tablet Take 1 tablet by mouth every 6 (six) hours as needed for moderate pain. (Patient not taking: Reported on 10/24/2020) 15 tablet 0   lamoTRIgine (LAMICTAL) 100 MG  tablet Take 100 mg by mouth every evening.     levETIRAcetam (KEPPRA XR) 500 MG 24 hr tablet 4 (four) times daily.      levothyroxine (SYNTHROID) 50 MCG tablet TAKE 1 TABLET BY MOUTH EVERY DAY BEFORE BREAKFAST 90 tablet 1   methotrexate (RHEUMATREX) 5 MG tablet Take 5 mg by mouth once a week. Caution: Chemotherapy. Protect from light.     promethazine (PHENERGAN) 25 MG suppository Place 1 suppository (25 mg total) rectally every 6 (six) hours as needed for nausea or vomiting. 12 each 0   simvastatin (ZOCOR) 40 MG tablet TAKE 1 TABLET BY MOUTH  EVERYDAY AT BEDTIME 90 tablet 3   SUMAtriptan (IMITREX) 100 MG tablet TAKE 1 TABLET (100 MG TOTAL) BY MOUTH EVERY 2 (TWO) HOURS AS NEEDED FOR MIGRAINE. 30 tablet 3   No current facility-administered medications for this encounter.    Physical Findings: The patient is in no acute distress. Patient is alert and oriented.  height is $RemoveB'5\' 3"'rWdhKWHT$  (1.6 m) and weight is 237 lb 12.8 oz (107.9 kg). Her temperature is 97.8 F (36.6 C). Her blood pressure is 136/88 and her pulse is 100. Her respiration is 20 and oxygen saturation is 97%. .  No significant changes. Lungs are clear to auscultation bilaterally. Heart has regular rate and rhythm. No palpable cervical, supraclavicular, or axillary adenopathy. Abdomen soft, non-tender, normal bowel sounds.  Left Breast: no palpable mass, nipple discharge or bleeding.  The right breast area shows less erythema on my examination compared to exam several days ago.  There continues to be some edema in the breast.  Lab Findings: Lab Results  Component Value Date   WBC 4.6 10/24/2020   HGB 14.2 10/24/2020   HCT 43.4 10/24/2020   MCV 94.6 10/24/2020   PLT 175 10/24/2020    Radiographic Findings: No results found.  Impression:  Stage IA, cT1aN0M0 Right Breast UOQ, Invasive Ductal Carcinoma with DCIS, ER+ / PR+ / Her2-, Grade 1-2  The patient appears to be responding well to her antibiotic for cellulitis.  I am unsure why she feels this is worsened over the past few days.  Recommended we increase the length of her being on the doxycycline for 3 more days.  Prescription forwarded to her pharmacy.  Plan: Patient will follow-up in the next several days after she has completed her antibiotic therapy.  She appears to be having some lymphedema in the breast related to her radiation therapy and have requested physical therapy evaluation concerning this issue after she has completed her antibiotic therapy.   15 minutes of total time was spent for this patient encounter,  including preparation, face-to-face counseling with the patient and coordination of care, physical exam, and documentation of the encounter. ____________________________________  Blair Promise, PhD, MD   This document serves as a record of services personally performed by Gery Pray, MD. It was created on his behalf by Roney Mans, a trained medical scribe. The creation of this record is based on the scribe's personal observations and the provider's statements to them. This document has been checked and approved by the attending provider.

## 2020-10-29 NOTE — Progress Notes (Signed)
Margaret Beasley is here today for follow up post radiation to the breast.   Breast Side:right   They completed their radiation on: 09/24/20  Does the patient complain of any of the following: Post radiation skin issues: Patient reports having increased redness to right breast Breast Tenderness: reports having pains to right  side of breast that goes into the areola area.  Breast Swelling: mild swelling Lymphadema: no Range of Motion limitations: full range motion. Fatigue post radiation: mild fatigue Appetite good/fair/poor: fair, patient reports increased nausea.   Additional comments if applicable:  patient reports having visual disturbance and feeling lightheaded .  Vitals:   10/29/20 1341  BP: 136/88  Pulse: 100  Resp: 20  Temp: 97.8 F (36.6 C)  SpO2: 97%  Weight: 237 lb 12.8 oz (107.9 kg)  Height: '5\' 3"'$  (1.6 m)

## 2020-10-30 ENCOUNTER — Ambulatory Visit: Payer: 59 | Admitting: Radiation Oncology

## 2020-10-30 ENCOUNTER — Other Ambulatory Visit: Payer: Self-pay | Admitting: Radiation Oncology

## 2020-10-30 MED ORDER — DOXYCYCLINE HYCLATE 100 MG PO TABS: 100.0000 mg | ORAL_TABLET | Freq: Two times a day (BID) | ORAL | 0 refills | Status: DC

## 2020-11-04 ENCOUNTER — Encounter: Payer: Self-pay | Admitting: Radiology

## 2020-11-05 NOTE — Progress Notes (Signed)
Radiation Oncology         [phone] ________________________________  Name: Margaret Beasley MRN: [id number]  Date: 11/06/2020  DOB: [birth year]  Follow-Up Visit Note  CC: [name], MD  Nicholas Lose, MD    ICD-10-CM   1. Malignant neoplasm of upper-outer quadrant of right breast in female, estrogen receptor positive ([place])  C50.411    Z17.0     2. Malignant neoplasm of upper-outer quadrant of female breast, unspecified estrogen receptor status, unspecified laterality ([place])  C50.419     3. Ductal carcinoma in situ (DCIS) of left breast  D05.12 hyaluronate sodium (RADIAPLEXRX) gel      Diagnosis: Stage IA, cT1aN0M0 Right Breast UOQ, Invasive Ductal Carcinoma with DCIS, ER+ / PR+ / Her2-, Grade 1-2  Interval Since Last Radiation: 1 month and 12 days    Intent: Curative   Radiation Treatment Dates: 08/26/2020 through 09/24/2020 Site Technique Total Dose (Gy) Dose per Fx (Gy) Completed Fx Beam Energies  Breast, Right: Breast_Rt 3D 40.05/40.05 2.67 15/15 10X, 15X  Breast, Right: Breast_Rt_Bst 3D 12/12 2 6/6 6X, 15X   Narrative:  The patient returns today for follow-up following completion of her antibiotic therapy for cellulitis (she was seen here for follow-up last week on 10/29/20). Since then, the patient followed  up with Dr. Lynnette Caffey, [facility], for her history of seizures. The patient was noted to be doing well from a neurologic perspective, she did not report any recent breakthrough seizures at this time. Patient reported some lymphedema, but overall appeared stable.   She tolerated the antibiotic therapy well without any nausea or problems with diarrhea.  She denies any pain within the right breast area.  Denies any nipple discharge or bleeding.  Allergies:  is allergic to opium, topiramate, benadryl [diphenhydramine hcl], butorphanol, celecoxib, latex, penicillins, gabapentin, oxycodone-acetaminophen, requip [ropinirole], stadol [butorphanol tartrate],  topamax, and tramadol.  Meds: Current Outpatient Medications  Medication Sig Dispense Refill   folic acid (FOLVITE) 1 MG tablet Take 1 tablet by mouth daily.     lamoTRIgine (LAMICTAL) 100 MG tablet Take 100 mg by mouth every evening.     levETIRAcetam (KEPPRA XR) 500 MG 24 hr tablet 4 (four) times daily.      levothyroxine (SYNTHROID) 50 MCG tablet TAKE 1 TABLET BY MOUTH EVERY DAY BEFORE BREAKFAST 90 tablet 1   methotrexate (RHEUMATREX) 5 MG tablet Take 5 mg by mouth once a week. Caution: Chemotherapy. Protect from light.     promethazine (PHENERGAN) 25 MG suppository Place 1 suppository (25 mg total) rectally every 6 (six) hours as needed for nausea or vomiting. 12 each 0   simvastatin (ZOCOR) 40 MG tablet TAKE 1 TABLET BY MOUTH EVERYDAY AT BEDTIME 90 tablet 3   SUMAtriptan (IMITREX) 100 MG tablet TAKE 1 TABLET (100 MG TOTAL) BY MOUTH EVERY 2 (TWO) HOURS AS NEEDED FOR MIGRAINE. 30 tablet 3   anastrozole (ARIMIDEX) 1 MG tablet Take 1 tablet (1 mg total) by mouth daily. (Patient not taking: No sig reported) 90 tablet 0   DULoxetine (CYMBALTA) 30 MG capsule Take 3 capsules (90 mg total) by mouth daily. **LUPIN MFR**  **PATIENT NEEDS OFFICE VISIT FOR ADDITIONAL REFILLS** 90 capsule 0   HYDROcodone-acetaminophen (NORCO/VICODIN) 5-325 MG tablet Take 1 tablet by mouth every 6 (six) hours as needed for moderate pain. (Patient not taking: No sig reported) 15 tablet 0   No current facility-administered medications for this encounter.    Physical Findings: The patient is in no acute distress. Patient  is alert and oriented.  height is _0  (1.6 m) and weight is 236 lb 3.2 oz (107.1 kg). Her temperature is 97.6 F (36.4 C). Her blood pressure is 132/72 and her pulse is 80. Her respiration is 20 and oxygen saturation is 100%. .  No significant changes. Lungs are clear to auscultation bilaterally. Heart has regular rate and rhythm. No palpable cervical, supraclavicular, or axillary adenopathy. Abdomen  soft, non-tender, normal bowel sounds.  Left Breast: no palpable mass, nipple discharge or bleeding.  Right breast shows improvement in the erythema.  She does continue however have erythema and some hyperpigmentation changes throughout the breast.  Findings are more consistent with lymphedema not infection at this point.  The breast has slight warmth compared to the left breast.   Lab Findings: Lab Results  Component Value Date   WBC 4.6 10/24/2020   HGB 14.2 10/24/2020   HCT 43.4 10/24/2020   MCV 94.6 10/24/2020   PLT 175 10/24/2020    Radiographic Findings: No results found.  Impression:  Stage IA, cT1aN0M0 Right Breast UOQ, Invasive Ductal Carcinoma with DCIS, ER+ / PR+ / Her2-, Grade 1-2  The patient is recovering from the effects of radiation.  She has completed a course of antibiotic therapy for her cellulitis.  No obvious signs of infection within the breast at this time.  Discussed with the patient the erythema she is seeing is related to lymphedema.  She reports no chills or fever since I saw her last in follow-up.  Plan: Patient will proceed with physical therapy evaluation.  Routine follow-up in radiation oncology in 3 months.    ____________________________________  Blair Promise, PhD, MD   This document serves as a record of services personally performed by Gery Pray, MD. It was created on his behalf by Roney Mans, a trained medical scribe. The creation of this record is based on the scribe's personal observations and the provider's statements to them. This document has been checked and approved by the attending provider.

## 2020-11-05 NOTE — Progress Notes (Incomplete)
  Radiation Oncology         (336) [phone] ________________________________  Patient Name: Margaret Beasley MRN: [id number] DOB: 08-Nov-1955 Referring Physician: Nicholas Lose (Profile Not Attached) Date of Service: 09/24/2020 [facility], [place]  End Of Treatment Note  Diagnoses: C50.411-Malignant neoplasm of upper-outer quadrant of right female breast Z17.0-Estrogen receptor positive status [ER+]  Cancer Staging: Stage IA, cT1aN0M0 Right Breast UOQ, Invasive Ductal Carcinoma with DCIS, ER+ / PR+ / Her2-, Grade 1-2  Intent: Curative  Radiation Treatment Dates: 08/26/2020 through 09/24/2020 Site Technique Total Dose (Gy) Dose per Fx (Gy) Completed Fx Beam Energies  Breast, Right: Breast_Rt 3D 40.05/40.05 2.67 15/15 10X, 15X  Breast, Right: Breast_Rt_Bst 3D 12/12 2 6/6 6X, 15X   Narrative: The patient tolerated radiation therapy relatively well. She reports having tenderness underneath right breast. Desquamation noted to this area as well as right axilla. Redness also noted to right breast, chest and axilla as well as rash noted between breasts. Demonstrates full range of motion. Appetite is good. Drinking increased amounts of fluids. Reports mild fatigue.  She reports concerns of tenderness of left breast as well as sensation that a rat was running up her left breast (though she does not actually believe there was a rat, it just felt that way spontaneously one day).  On physical exam: Moderate erythema noted to right breast, with dry peeling at IM fold. Left breast with no palpable masses; + tenderness to inner breast to palpation.    Plan: The patient will follow-up with radiation oncology in one month. She will let me know in Sept at f/u if left breast symptoms/ tenderness continues. ________________________________________________ -----------------------------------  Blair Promise, PhD, MD  This document serves as a record of services personally performed by Gery Pray, MD. It was created on his behalf by Roney Mans, a trained medical scribe. The creation of this record is based on the scribe's personal observations and the provider's statements to them. This document has been checked and approved by the attending provider.

## 2020-11-06 ENCOUNTER — Encounter: Payer: Self-pay | Admitting: Radiation Oncology

## 2020-11-06 ENCOUNTER — Other Ambulatory Visit: Payer: Self-pay

## 2020-11-06 ENCOUNTER — Ambulatory Visit
Admission: RE | Admit: 2020-11-06 | Discharge: 2020-11-06 | Disposition: A | Discharge status: Home / Self Care | Payer: 59 | Source: Ambulatory Visit | Attending: Radiation Oncology | Admitting: Radiation Oncology

## 2020-11-06 VITALS — BP 132/72 | HR 80 | Temp 97.6°F | Resp 20 | Ht 63.0 in | Wt 236.2 lb

## 2020-11-06 DIAGNOSIS — Z79811 Long term (current) use of aromatase inhibitors: Secondary | ICD-10-CM | POA: Insufficient documentation

## 2020-11-06 DIAGNOSIS — C50411 Malignant neoplasm of upper-outer quadrant of right female breast: Secondary | ICD-10-CM | POA: Diagnosis not present

## 2020-11-06 DIAGNOSIS — N61 Mastitis without abscess: Secondary | ICD-10-CM | POA: Diagnosis not present

## 2020-11-06 DIAGNOSIS — Z923 Personal history of irradiation: Secondary | ICD-10-CM | POA: Diagnosis not present

## 2020-11-06 DIAGNOSIS — Z79899 Other long term (current) drug therapy: Secondary | ICD-10-CM | POA: Diagnosis not present

## 2020-11-06 DIAGNOSIS — D0512 Intraductal carcinoma in situ of left breast: Secondary | ICD-10-CM | POA: Diagnosis not present

## 2020-11-06 DIAGNOSIS — Z170 Estrogen receptor positive status [ER+]: Secondary | ICD-10-CM | POA: Diagnosis not present

## 2020-11-06 DIAGNOSIS — I890 Lymphedema, not elsewhere classified: Secondary | ICD-10-CM | POA: Insufficient documentation

## 2020-11-06 DIAGNOSIS — C50419 Malignant neoplasm of upper-outer quadrant of unspecified female breast: Secondary | ICD-10-CM

## 2020-11-06 MED ORDER — RADIAPLEXRX EX GEL: Freq: Once | CUTANEOUS | Status: AC

## 2020-11-06 NOTE — Progress Notes (Signed)
Margaret Beasley is here today for follow up post radiation to the breast.   Breast Side:Right   They completed their radiation on: 09/24/20  Does the patient complain of any of the following: Post radiation skin issues: Patient reports skin is still red to right breast and right back.  Breast Tenderness: yes Breast Swelling: yes Lymphadema: Patient to start lymphedema treatment on 11/19/20. Range of Motion limitations: Full range of motion  Fatigue post radiation: Continues to have mild fatigue.  Appetite good/fair/poor: Good  Additional comments if applicable:   Patient continues to have nausea and migraines.  Vitals:   11/06/20 0947  BP: 132/72  Pulse: 80  Resp: 20  Temp: 97.6 F (36.4 C)  SpO2: 100%  Weight: 236 lb 3.2 oz (107.1 kg)  Height: 5\' 3"  (1.6 m)

## 2020-11-10 ENCOUNTER — Other Ambulatory Visit: Payer: Self-pay | Admitting: *Deleted

## 2020-11-10 MED ORDER — DULOXETINE HCL 30 MG PO CPEP: 90.0000 mg | ORAL_CAPSULE | Freq: Every day | ORAL | 1 refills | Status: DC

## 2020-11-10 NOTE — Progress Notes (Signed)
Pt called and stated that [facility] hasn't had this medication in months and doesn't know when they will get this back in stock. She asked that it be sent to [facility] on [address].

## 2020-11-19 ENCOUNTER — Other Ambulatory Visit: Payer: Self-pay

## 2020-11-19 ENCOUNTER — Ambulatory Visit: Payer: 59 | Attending: Surgery | Admitting: Rehabilitation

## 2020-11-19 ENCOUNTER — Encounter: Payer: Self-pay | Admitting: Rehabilitation

## 2020-11-19 DIAGNOSIS — Z483 Aftercare following surgery for neoplasm: Secondary | ICD-10-CM | POA: Diagnosis not present

## 2020-11-19 DIAGNOSIS — R293 Abnormal posture: Secondary | ICD-10-CM

## 2020-11-19 DIAGNOSIS — C50411 Malignant neoplasm of upper-outer quadrant of right female breast: Secondary | ICD-10-CM | POA: Diagnosis present

## 2020-11-19 DIAGNOSIS — Z170 Estrogen receptor positive status [ER+]: Secondary | ICD-10-CM
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
## 2020-11-21 ENCOUNTER — Ambulatory Visit: Payer: 59 | Admitting: Rehabilitation

## 2020-11-26 ENCOUNTER — Ambulatory Visit: Payer: 59 | Admitting: Family Medicine

## 2020-12-05 ENCOUNTER — Encounter: Payer: 59 | Admitting: Rehabilitation

## 2020-12-09 ENCOUNTER — Encounter: Payer: 59 | Admitting: Rehabilitation

## 2020-12-11 ENCOUNTER — Encounter: Payer: 59 | Admitting: Rehabilitation

## 2020-12-16 ENCOUNTER — Encounter: Payer: 59 | Admitting: Rehabilitation

## 2020-12-19 ENCOUNTER — Encounter: Payer: 59 | Admitting: Rehabilitation

## 2020-12-23 ENCOUNTER — Encounter: Payer: Self-pay | Admitting: Family Medicine

## 2020-12-23 ENCOUNTER — Encounter: Payer: 59 | Admitting: Rehabilitation

## 2020-12-23 ENCOUNTER — Ambulatory Visit (INDEPENDENT_AMBULATORY_CARE_PROVIDER_SITE_OTHER): Payer: 59 | Admitting: Family Medicine

## 2020-12-23 ENCOUNTER — Other Ambulatory Visit: Payer: Self-pay

## 2020-12-23 VITALS — BP 130/79 | HR 83 | Ht 63.0 in | Wt 237.0 lb

## 2020-12-23 DIAGNOSIS — R110 Nausea: Secondary | ICD-10-CM | POA: Diagnosis not present

## 2020-12-23 DIAGNOSIS — R5383 Other fatigue: Secondary | ICD-10-CM | POA: Diagnosis not present

## 2020-12-23 DIAGNOSIS — R61 Generalized hyperhidrosis: Secondary | ICD-10-CM | POA: Diagnosis not present

## 2020-12-23 DIAGNOSIS — R252 Cramp and spasm: Secondary | ICD-10-CM | POA: Diagnosis not present

## 2020-12-23 DIAGNOSIS — E039 Hypothyroidism, unspecified: Secondary | ICD-10-CM

## 2020-12-23 DIAGNOSIS — R82998 Other abnormal findings in urine: Secondary | ICD-10-CM | POA: Diagnosis not present

## 2020-12-23 DIAGNOSIS — R1314 Dysphagia, pharyngoesophageal phase: Secondary | ICD-10-CM

## 2020-12-23 DIAGNOSIS — R140 Abdominal distension (gaseous): Secondary | ICD-10-CM

## 2020-12-23 MED ORDER — PANTOPRAZOLE SODIUM 40 MG PO TBEC: 40.0000 mg | DELAYED_RELEASE_TABLET | Freq: Every day | ORAL | 1 refills | Status: DC

## 2020-12-23 NOTE — Assessment & Plan Note (Signed)
Plan to recheck TSH.  Last 1 was checked about 11 months ago still to make sure nothing is changed that could be causing her nausea.
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
## 2020-12-23 NOTE — Assessment & Plan Note (Signed)
He is also having difficulty swallowing things certain foods.  I would like to refer her to GI for further evaluation we discussed that this can very commonly be an esophageal stricture.  There are also other causes such as severe GERD.  She is due for a colonoscopy as well anyway so they might even be able to do both if she ends up needing an endoscopy for further work-up.  We will leave that up to Dr. Bryan Lemma.

## 2020-12-24 LAB — COMPLETE METABOLIC PANEL WITH GFR
AG Ratio: 1.6 (calc) (ref 1.0–2.5)
ALT: 18 U/L (ref 6–29)
AST: 21 U/L (ref 10–35)
Albumin: 4.2 g/dL (ref 3.6–5.1)
Alkaline phosphatase (APISO): 64 U/L (ref 37–153)
BUN: 11 mg/dL (ref 7–25)
CO2: 29 mmol/L (ref 20–32)
Calcium: 9.5 mg/dL (ref 8.6–10.4)
Chloride: 104 mmol/L (ref 98–110)
Creat: 0.98 mg/dL (ref 0.50–1.05)
Globulin: 2.6 g/dL (calc) (ref 1.9–3.7)
Glucose, Bld: 95 mg/dL (ref 65–139)
Potassium: 4.9 mmol/L (ref 3.5–5.3)
Sodium: 141 mmol/L (ref 135–146)
Total Bilirubin: 0.5 mg/dL (ref 0.2–1.2)
Total Protein: 6.8 g/dL (ref 6.1–8.1)
eGFR: 64 mL/min/{1.73_m2} (ref >=60)

## 2020-12-24 LAB — CK: Total CK: 61 U/L (ref 29–143)

## 2020-12-24 LAB — URINALYSIS, ROUTINE W REFLEX MICROSCOPIC
Bilirubin Urine: NEGATIVE
Glucose, UA: NEGATIVE
Hgb urine dipstick: NEGATIVE
Ketones, ur: NEGATIVE
Leukocytes,Ua: NEGATIVE
Nitrite: NEGATIVE
Protein, ur: NEGATIVE
Specific Gravity, Urine: 1.006 (ref 1.001–1.035)
pH: 5.5 (ref 5.0–8.0)

## 2020-12-24 LAB — LIPASE: Lipase: 29 U/L (ref 7–60)

## 2020-12-24 LAB — LIPID PANEL
Cholesterol: 216 mg/dL — ABNORMAL HIGH (ref <200)
HDL: 52 mg/dL (ref >=50)
LDL Cholesterol (Calc): 122 mg/dL (calc) — ABNORMAL HIGH
Non-HDL Cholesterol (Calc): 164 mg/dL (calc) — ABNORMAL HIGH (ref <130)
Total CHOL/HDL Ratio: 4.2 (calc) (ref <5.0)
Triglycerides: 273 mg/dL — ABNORMAL HIGH (ref <150)

## 2020-12-24 LAB — TSH: TSH: 4 mIU/L (ref 0.40–4.50)

## 2020-12-25 ENCOUNTER — Other Ambulatory Visit: Payer: Self-pay

## 2020-12-25 ENCOUNTER — Encounter: Payer: 59 | Admitting: Rehabilitation

## 2020-12-25 DIAGNOSIS — E038 Other specified hypothyroidism: Secondary | ICD-10-CM

## 2020-12-25 NOTE — Progress Notes (Signed)
Hi Margaret Beasley,  LDL cholesterol and triglycerides are up compared to last year.  Just encourage you to continue to work on healthy diet and regular activity level to get the heart rate out.  Metabolic panel looks good.  No sign of pancreatitis.  Muscle enzyme level is normal which is reassuring especially with the muscle cramping.  Your thyroid is off a little bit.  Still technically in the normal range but it is on the higher end which means we need to actually increase your medication.  I believe you are taking 1 whole pill daily.  Lets have you increase to an extra half of a tab 2 days a week.  So for example on Mondays and Thursdays you would take 1-1/2 tabs.  The other 5 days a week you would just take 1 whole tab.  And repeat this pattern each week.  And then we would recheck your TSH in about 6 to 8 weeks.

## 2020-12-25 NOTE — Progress Notes (Signed)
Per provider's request, TSH labs ordered for recheck in 6-8 weeks.

## 2020-12-26 ENCOUNTER — Ambulatory Visit (INDEPENDENT_AMBULATORY_CARE_PROVIDER_SITE_OTHER): Payer: 59

## 2020-12-26 ENCOUNTER — Other Ambulatory Visit: Payer: Self-pay

## 2020-12-26 DIAGNOSIS — R140 Abdominal distension (gaseous): Secondary | ICD-10-CM | POA: Diagnosis not present

## 2020-12-26 DIAGNOSIS — R110 Nausea: Secondary | ICD-10-CM

## 2020-12-29 NOTE — Progress Notes (Signed)
Hi Sanjana,  The good news is as there is no significant abnormalities.  No mass etc, to explain the bloating.  The liver is echogenic which oftentimes reflects a increase in fat content in the liver.  Also called fatty liver.  The main treatment for fatty liver is to reduce carb and sugar intake and increase vegetables and lean proteins.  As well as staying active.  I would really like to see how you respond to the slight increased dose in your thyroid medication over the next couple of weeks.  I am actually hoping it may really help with some of your symptoms.  We did go ahead and place referral to GI because am also concerned about your problems with swallowing.  We can also have them address the nausea as well.  In having that the pantoprazole is helping.

## 2020-12-31 ENCOUNTER — Other Ambulatory Visit: Payer: Self-pay | Admitting: *Deleted

## 2020-12-31 MED ORDER — DULOXETINE HCL 30 MG PO CPEP: 90.0000 mg | ORAL_CAPSULE | Freq: Every day | ORAL | 1 refills | Status: DC

## 2021-01-06 ENCOUNTER — Telehealth: Payer: Self-pay | Admitting: *Deleted

## 2021-01-06 NOTE — Telephone Encounter (Signed)
CALLED PATIENT TO ALTER FU ON 02-05-21 DUE TO DR. KINARD BEING ON VACATION, RESCHEDULED FOR 02-23-21 @ 10:30 AM PATIENT AGREED TO NEW DATE AND TIME

## 2021-01-11 ENCOUNTER — Encounter: Payer: Self-pay | Admitting: Physical Therapy

## 2021-01-12 ENCOUNTER — Ambulatory Visit: Payer: 59

## 2021-01-12 ENCOUNTER — Telehealth: Payer: Self-pay | Admitting: Radiology

## 2021-01-12 NOTE — Progress Notes (Signed)
Radiation Oncology         [phone] ________________________________  Name: Margaret Beasley MRN: [id number]  Date: 01/13/2021  DOB: 1955-05-22  Follow-Up Visit Note  CC: [name], MD  Nicholas Lose, MD    ICD-10-CM   1. Malignant neoplasm of upper-outer quadrant of right breast in female, estrogen receptor positive ([place])  C50.411 hyaluronate sodium (RADIAPLEXRX) gel 1 application   I95.1 MM Digital Diagnostic Unilat R    US BREAST COMPLETE UNI RIGHT INC AXILLA      Diagnosis: Stage IA, cT1aN0M0 Right Breast UOQ, Invasive Ductal Carcinoma with DCIS, ER+ / PR+ / Her2-, Grade 1-2  Interval Since Last Radiation: 3 months, and 19 days   Intent: Curative   Radiation Treatment Dates: 08/26/2020 through 09/24/2020 Site Technique Total Dose (Gy) Dose per Fx (Gy) Completed Fx Beam Energies  Breast, Right: Breast_Rt 3D 40.05/40.05 2.67 15/15 10X, 15X  Breast, Right: Breast_Rt_Bst 3D 12/12 2 6/6 6X, 15X    Narrative:  The patient returns today for  follow-up, she was last seen here for follow-up on 11/06/20.  She wanted to be seen as she had developed some pain in the right breast and felt she could palpate a small lump in the upper outer aspect of the breast.    Since then, the patient received an abdominal ultrasound on 12/26/20 for evaluation of nausea and bloating which revealed overall normal findings other than a slightly echogenic appearing liver (suggestive of steatosis).   Otherwise, no significant interval history since the patient was last seen.                               Allergies:  is allergic to opium, topiramate, benadryl [diphenhydramine hcl], butorphanol, celecoxib, latex, penicillins, gabapentin, oxycodone-acetaminophen, requip [ropinirole], stadol [butorphanol tartrate], topamax, and tramadol.  Meds: Current Outpatient Medications  Medication Sig Dispense Refill   DULoxetine (CYMBALTA) 30 MG capsule Take 3 capsules (90 mg total) by mouth daily.  **LUPIN MFR** 884 capsule 1   folic acid (FOLVITE) 1 MG tablet Take 1 tablet by mouth daily.     lamoTRIgine (LAMICTAL) 100 MG tablet Take 100 mg by mouth every evening.     levETIRAcetam (KEPPRA XR) 500 MG 24 hr tablet 4 (four) times daily.      levothyroxine (SYNTHROID) 50 MCG tablet TAKE 1 TABLET BY MOUTH EVERY DAY BEFORE BREAKFAST 90 tablet 1   methotrexate (RHEUMATREX) 2.5 MG tablet Take by mouth.     promethazine (PHENERGAN) 25 MG suppository Place 1 suppository (25 mg total) rectally every 6 (six) hours as needed for nausea or vomiting. 12 each 0   simvastatin (ZOCOR) 40 MG tablet TAKE 1 TABLET BY MOUTH EVERYDAY AT BEDTIME 90 tablet 3   SUMAtriptan (IMITREX) 100 MG tablet TAKE 1 TABLET (100 MG TOTAL) BY MOUTH EVERY 2 (TWO) HOURS AS NEEDED FOR MIGRAINE. 30 tablet 3   anastrozole (ARIMIDEX) 1 MG tablet Take 1 tablet (1 mg total) by mouth daily. (Patient not taking: Reported on 01/13/2021) 90 tablet 0   fluorouracil (EFUDEX) 5 % cream Apply topically 2 (two) times a week. (Patient not taking: Reported on 01/13/2021)     HYDROcodone-acetaminophen (NORCO/VICODIN) 5-325 MG tablet Take 1 tablet by mouth every 6 (six) hours as needed for moderate pain. (Patient not taking: Reported on 01/13/2021) 15 tablet 0   pantoprazole (PROTONIX) 40 MG tablet Take 1 tablet (40 mg total) by mouth daily. (Patient not taking: Reported  on 01/13/2021) 30 tablet 1   No current facility-administered medications for this encounter.    Physical Findings: The patient is in no acute distress. Patient is alert and oriented.  height is $RemoveB'5\' 3"'nCDcKJCu$  (1.6 m) and weight is 237 lb 3.2 oz (107.6 kg). Her temperature is 97.6 F (36.4 C). Her blood pressure is 128/82 and her pulse is 79. Her respiration is 20 and oxygen saturation is 97%. .  No significant changes. Lungs are clear to auscultation bilaterally. Heart has regular rate and rhythm. No palpable cervical, supraclavicular, or axillary adenopathy. Abdomen soft, non-tender,  normal bowel sounds.  Left breast: no palpable mass, nipple discharge or bleeding.  The right breast area shows some edema particularly in the central aspect of the breast area.  Mild erythema consistent with edema effects.  No obvious signs of infection of the breast.  No nipple discharge or bleeding.  Showed me the area that she could palpate in the upper outer aspect of breast and I was not convinced that I could palpate something in this area.    Lab Findings: Lab Results  Component Value Date   WBC 4.6 10/24/2020   HGB 14.2 10/24/2020   HCT 43.4 10/24/2020   MCV 94.6 10/24/2020   PLT 175 10/24/2020    Radiographic Findings: US Abdomen Complete  Result Date: 12/26/2020 CLINICAL DATA:  Nausea bloating EXAM: ABDOMEN ULTRASOUND COMPLETE COMPARISON:  None. FINDINGS: Gallbladder: Surgically absent Common bile duct: Diameter: 7.7 mm Liver: Liver slightly echogenic. No focal hepatic abnormality. Portal vein is patent on color Doppler imaging with normal direction of blood flow towards the liver. IVC: No abnormality visualized. Pancreas: Visualized portion unremarkable. Spleen: Size and appearance within normal limits. Right Kidney: Length: 9.9 cm. Echogenicity within normal limits. No mass or hydronephrosis visualized. Left Kidney: Length: 10.7 cm. Echogenicity within normal limits. No mass or hydronephrosis visualized. Abdominal aorta: No aneurysm visualized. Other findings: None. IMPRESSION: 1. Status post cholecystectomy 2. Slightly echogenic liver suggesting steatosis Electronically Signed   By: Donavan Foil M.D.   On: 12/26/2020 20:08    Impression:  Stage IA, cT1aN0M0 Right Breast UOQ, Invasive Ductal Carcinoma with DCIS, ER+ / PR+ / Her2-, Grade 1-2  The patient is recovering from the effects of radiation.  Patient is concerned about her breast pain and possible mass within the breast.  She wishes to undergo imaging for further evaluation.  We will get her set up for diagnostic mammogram  of the right breast and ultrasound.  Should mention that the patient stopped doing her lymphedema breast exercises and this may in part be related to recurrent lymphedema with associated discomfort/pain.  Patient canceled her sozo follow-up recently and will reschedule this in the near future.  Plan: Right breast diagnostic mammogram and ultrasound.  Follow-up afterward to review results   20 minutes of total time was spent for this patient encounter, including preparation, face-to-face counseling with the patient and coordination of care, physical exam, and documentation of the encounter. ____________________________________  Blair Promise, PhD, MD   This document serves as a record of services personally performed by Gery Pray, MD. It was created on his behalf by Roney Mans, a trained medical scribe. The creation of this record is based on the scribe's personal observations and the provider's statements to them. This document has been checked and approved by the attending provider.

## 2021-01-12 NOTE — Telephone Encounter (Signed)
Patient reports pain in right breast to axilla where she had surgery. States it feels like when pressing on a bruise for 1-2 weeks. Will schedule follow up per Dr Sondra Come.

## 2021-01-13 ENCOUNTER — Ambulatory Visit
Admission: RE | Admit: 2021-01-13 | Discharge: 2021-01-13 | Disposition: A | Discharge status: Home / Self Care | Payer: 59 | Source: Ambulatory Visit | Attending: Radiation Oncology | Admitting: Radiation Oncology

## 2021-01-13 ENCOUNTER — Other Ambulatory Visit: Payer: Self-pay

## 2021-01-13 ENCOUNTER — Encounter: Payer: Self-pay | Admitting: Radiation Oncology

## 2021-01-13 VITALS — BP 128/82 | HR 79 | Temp 97.6°F | Resp 20 | Ht 63.0 in | Wt 237.2 lb

## 2021-01-13 DIAGNOSIS — Z923 Personal history of irradiation: Secondary | ICD-10-CM | POA: Diagnosis not present

## 2021-01-13 DIAGNOSIS — R110 Nausea: Secondary | ICD-10-CM | POA: Insufficient documentation

## 2021-01-13 DIAGNOSIS — R140 Abdominal distension (gaseous): Secondary | ICD-10-CM | POA: Diagnosis not present

## 2021-01-13 DIAGNOSIS — Z170 Estrogen receptor positive status [ER+]: Secondary | ICD-10-CM | POA: Insufficient documentation

## 2021-01-13 DIAGNOSIS — C50411 Malignant neoplasm of upper-outer quadrant of right female breast: Secondary | ICD-10-CM | POA: Insufficient documentation

## 2021-01-13 DIAGNOSIS — Z79899 Other long term (current) drug therapy: Secondary | ICD-10-CM | POA: Diagnosis not present

## 2021-01-13 MED ORDER — RADIAPLEXRX EX GEL
1.0000 "application " | Freq: Once | CUTANEOUS | Status: AC
  Administered 2021-01-13: 1 via TOPICAL

## 2021-01-13 NOTE — Progress Notes (Signed)
Margaret Beasley is here today for follow up post radiation to the breast.   Breast Side:right   They completed their radiation on: 09/24/2020   Does the patient complain of any of the following: Post radiation skin issues: denies Breast Tenderness: reports right breast pain Breast Swelling: mild swelling especially around nipple Lymphadema: denies Range of Motion limitations: good range of motion Fatigue post radiation: moderate fatigue Appetite good/fair/poor: fair  Additional comments if applicable: right breast/axilla pain  Vitals:   01/13/21 0828  BP: 128/82  Pulse: 79  Resp: 20  Temp: 97.6 F (36.4 C)  SpO2: 97%  Weight: 237 lb 3.2 oz (107.6 kg)  Height: 5\' 3"  (1.6 m)

## 2021-01-27 ENCOUNTER — Ambulatory Visit
Admission: RE | Admit: 2021-01-27 | Discharge: 2021-01-27 | Disposition: A | Discharge status: Home / Self Care | Payer: 59 | Source: Ambulatory Visit | Attending: Radiation Oncology | Admitting: Radiation Oncology

## 2021-01-27 ENCOUNTER — Ambulatory Visit: Payer: 59 | Admitting: Gastroenterology

## 2021-01-27 DIAGNOSIS — C50411 Malignant neoplasm of upper-outer quadrant of right female breast: Secondary | ICD-10-CM

## 2021-01-27 HISTORY — DX: Personal history of irradiation: Z92.3

## 2021-02-05 ENCOUNTER — Ambulatory Visit: Payer: Self-pay | Admitting: Radiation Oncology

## 2021-02-09 ENCOUNTER — Other Ambulatory Visit: Payer: Self-pay | Admitting: Family Medicine

## 2021-02-09 DIAGNOSIS — E039 Hypothyroidism, unspecified: Secondary | ICD-10-CM

## 2021-02-10 ENCOUNTER — Ambulatory Visit: Payer: 59 | Attending: Surgery

## 2021-02-10 ENCOUNTER — Other Ambulatory Visit: Payer: Self-pay

## 2021-02-10 ENCOUNTER — Other Ambulatory Visit: Payer: Self-pay | Admitting: Radiation Oncology

## 2021-02-10 VITALS — Wt 240.1 lb

## 2021-02-10 DIAGNOSIS — Z483 Aftercare following surgery for neoplasm: Secondary | ICD-10-CM | POA: Insufficient documentation
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
## 2021-02-11 ENCOUNTER — Ambulatory Visit
Admission: RE | Admit: 2021-02-11 | Discharge: 2021-02-11 | Disposition: A | Discharge status: Home / Self Care | Payer: 59 | Source: Ambulatory Visit | Attending: Radiation Oncology | Admitting: Radiation Oncology

## 2021-02-11 ENCOUNTER — Other Ambulatory Visit: Payer: Self-pay

## 2021-02-11 ENCOUNTER — Telehealth: Payer: Self-pay | Admitting: *Deleted

## 2021-02-11 ENCOUNTER — Encounter: Payer: Self-pay | Admitting: Radiation Oncology

## 2021-02-11 VITALS — BP 125/78 | HR 84 | Temp 97.2°F | Resp 22 | Ht 63.0 in | Wt 238.2 lb

## 2021-02-11 DIAGNOSIS — Z79811 Long term (current) use of aromatase inhibitors: Secondary | ICD-10-CM | POA: Insufficient documentation

## 2021-02-11 DIAGNOSIS — R350 Frequency of micturition: Secondary | ICD-10-CM | POA: Diagnosis not present

## 2021-02-11 DIAGNOSIS — Z79899 Other long term (current) drug therapy: Secondary | ICD-10-CM | POA: Diagnosis not present

## 2021-02-11 DIAGNOSIS — C50411 Malignant neoplasm of upper-outer quadrant of right female breast: Secondary | ICD-10-CM | POA: Diagnosis not present

## 2021-02-11 DIAGNOSIS — Z170 Estrogen receptor positive status [ER+]: Secondary | ICD-10-CM | POA: Diagnosis not present

## 2021-02-11 DIAGNOSIS — N1832 Chronic kidney disease, stage 3b: Secondary | ICD-10-CM | POA: Diagnosis not present

## 2021-02-11 LAB — CBC WITH DIFFERENTIAL (CANCER CENTER ONLY)
Abs Immature Granulocytes: 0.02 10*3/uL (ref 0.00–0.07)
Basophils Absolute: 0 10*3/uL (ref 0.0–0.1)
Basophils Relative: 1 %
Eosinophils Absolute: 0.2 10*3/uL (ref 0.0–0.5)
Eosinophils Relative: 4 %
HCT: 44.1 % (ref 36.0–46.0)
Hemoglobin: 14.4 g/dL (ref 12.0–15.0)
Immature Granulocytes: 0 %
Lymphocytes Relative: 22 %
Lymphs Abs: 1.1 10*3/uL (ref 0.7–4.0)
MCH: 32.2 pg (ref 26.0–34.0)
MCHC: 32.7 g/dL (ref 30.0–36.0)
MCV: 98.7 fL (ref 80.0–100.0)
Monocytes Absolute: 0.5 10*3/uL (ref 0.1–1.0)
Monocytes Relative: 11 %
Neutro Abs: 3 10*3/uL (ref 1.7–7.7)
Neutrophils Relative %: 62 %
Platelet Count: 237 10*3/uL (ref 150–400)
RBC: 4.47 MIL/uL (ref 3.87–5.11)
RDW: 14 % (ref 11.5–15.5)
WBC Count: 4.8 10*3/uL (ref 4.0–10.5)
nRBC: 0 % (ref 0.0–0.2)

## 2021-02-11 LAB — BASIC METABOLIC PANEL - CANCER CENTER ONLY
Anion gap: 5 (ref 5–15)
BUN: 12 mg/dL (ref 8–23)
CO2: 30 mmol/L (ref 22–32)
Calcium: 8.8 mg/dL — ABNORMAL LOW (ref 8.9–10.3)
Chloride: 103 mmol/L (ref 98–111)
Creatinine: 1.07 mg/dL — ABNORMAL HIGH (ref 0.44–1.00)
GFR, Estimated: 58 mL/min — ABNORMAL LOW (ref >60)
Glucose, Bld: 100 mg/dL — ABNORMAL HIGH (ref 70–99)
Potassium: 4.4 mmol/L (ref 3.5–5.1)
Sodium: 138 mmol/L (ref 135–145)

## 2021-02-11 LAB — URINALYSIS, COMPLETE (UACMP) WITH MICROSCOPIC
Bacteria, UA: NONE SEEN
Bilirubin Urine: NEGATIVE
Glucose, UA: NEGATIVE mg/dL
Hgb urine dipstick: NEGATIVE
Ketones, ur: NEGATIVE mg/dL
Leukocytes,Ua: NEGATIVE
Nitrite: NEGATIVE
Protein, ur: NEGATIVE mg/dL
Specific Gravity, Urine: 1.006 (ref 1.005–1.030)
pH: 6 (ref 5.0–8.0)

## 2021-02-11 MED ORDER — HYDROCODONE-ACETAMINOPHEN 5-325 MG PO TABS: 1.0000 | ORAL_TABLET | Freq: Four times a day (QID) | ORAL | 0 refills | Status: DC | PRN

## 2021-02-11 NOTE — Progress Notes (Signed)
Radiation Oncology         [phone] ________________________________  Name: Margaret Beasley MRN: [id number]  Date: 02/11/2021  DOB: [birth year]  Follow-Up Visit Note  CC: Margaret Marry, MD  Margaret Lose, MD    ICD-10-CM   1. Malignant neoplasm of upper-outer quadrant of right breast in female, estrogen receptor positive ([place])  C50.411 CBC with Differential ([place] Only)   O27.0 [place] Only    Urine Culture    Urinalysis, Complete w Microscopic    2. Chronic kidney disease (CKD) stage G3b/A1, moderately decreased glomerular filtration rate (GFR) between 30-44 mL/min/1.73 square meter and albuminuria creatinine ratio less than 30 mg/g ([facility])  N18.32 Urine Culture    Urinalysis, Complete w Microscopic      Diagnosis: Stage IA, (cT1a, cN0, cM0) Right Breast UOQ, Invasive Ductal Carcinoma with DCIS, ER+ / PR+ / Her2-, Grade 1-2  Interval Since Last Radiation: 4 months, and 18 days   Intent: Curative   Radiation Treatment Dates: 08/26/2020 through 09/24/2020 Site Technique Total Dose (Gy) Dose per Fx (Gy) Completed Fx Beam Energies  Breast, Right: Breast_Rt 3D 40.05/40.05 2.67 15/15 10X, 15X  Breast, Right: Breast_Rt_Bst 3D 12/12 2 6/6 6X, 15X    Narrative:  The patient returns today for routine follow-up. She was last seen here for follow-up on 01/13/21. To review from our last visit, the patient expressed concern regarding her breast pain and a possible mass within the right breast. Given so, the patient was scheduled for further imaging to better evaluate this. During out last visit, the patient also reported that she stopped doing her lymphedema breast exercises, which may in part be related to her recurrent lymphedema with associated discomfort/pain.    Accordingly, the patient proceeded to undergo diagnostic right breast mammogram and [place] on 01/27/21 which revealed interval postlumpectomy changes in the right breast, and no evidence  of malignancy involving the right breast. Interval skin thickening overlying the anterior aspect of the right breast was however appreciated, and redness on physical exam. Findings were noted as likely secondary to post radiation changes and lymphedema, though a skin punch biopsy could be  done if clinically indicated to exclude malignant pathology.   More recently the patient has noticed some erythema and swelling in the right breast.  She was seen in physical therapy and the physical therapist question possible cellulitis.  Hence the patient is now seen for examination to rule out cellulitis.  Patient also reports increasing fluid retention.  She reports that her urination frequency and volume has dropped off despite taking in normal fluid intake.   Allergies:  is allergic to opium, topiramate, benadryl [diphenhydramine hcl], butorphanol, celecoxib, latex, penicillins, gabapentin, oxycodone-acetaminophen, requip [ropinirole], stadol [butorphanol tartrate], topamax, and tramadol.  Meds: Current Outpatient Medications  Medication Sig Dispense Refill   acetaminophen (TYLENOL) 650 MG CR tablet Take by mouth.     DULoxetine (CYMBALTA) 30 MG capsule Take 3 capsules (90 mg total) by mouth daily. **LUPIN MFR** 350 capsule 1   folic acid (FOLVITE) 1 MG tablet Take 1 tablet by mouth daily.     HYDROcodone-acetaminophen (NORCO/VICODIN) 5-325 MG tablet Take 1 tablet by mouth every 6 (six) hours as needed for moderate pain. 15 tablet 0   lamoTRIgine (LAMICTAL) 100 MG tablet Take 100 mg by mouth every evening.     levETIRAcetam (KEPPRA XR) 500 MG 24 hr tablet 4 (four) times daily.      levothyroxine (SYNTHROID) 50 MCG tablet TAKE  1 TABLET BY MOUTH EVERY DAY BEFORE BREAKFAST 90 tablet 1   methotrexate (RHEUMATREX) 2.5 MG tablet Take by mouth.     simvastatin (ZOCOR) 40 MG tablet TAKE 1 TABLET BY MOUTH EVERYDAY AT BEDTIME 90 tablet 3   anastrozole (ARIMIDEX) 1 MG tablet Take 1 tablet (1 mg total) by mouth  daily. (Patient not taking: Reported on 01/13/2021) 90 tablet 0   fluorouracil (EFUDEX) 5 % cream Apply topically 2 (two) times a week. (Patient not taking: Reported on 01/13/2021)     HYDROcodone-acetaminophen (NORCO/VICODIN) 5-325 MG tablet Take 1 tablet by mouth every 6 (six) hours as needed for moderate pain. (Patient not taking: Reported on 01/13/2021) 15 tablet 0   pantoprazole (PROTONIX) 40 MG tablet Take 1 tablet (40 mg total) by mouth daily. (Patient not taking: Reported on 01/13/2021) 30 tablet 1   promethazine (PHENERGAN) 25 MG suppository Place 1 suppository (25 mg total) rectally every 6 (six) hours as needed for nausea or vomiting. (Patient not taking: Reported on 02/11/2021) 12 each 0   SUMAtriptan (IMITREX) 100 MG tablet TAKE 1 TABLET (100 MG TOTAL) BY MOUTH EVERY 2 (TWO) HOURS AS NEEDED FOR MIGRAINE. (Patient not taking: Reported on 02/11/2021) 30 tablet 3   No current facility-administered medications for this encounter.    Physical Findings: The patient is in no acute distress. Patient is alert and oriented.  height is _0  (1.6 m) and weight is 238 lb 3.2 oz (108 kg). Her temperature is 97.2 F (36.2 C) (abnormal). Her blood pressure is 125/78 and her pulse is 84. Her respiration is 22 (abnormal) and oxygen saturation is 100%. .  No significant changes. Lungs are clear to auscultation bilaterally. Heart has regular rate and rhythm. No palpable cervical, supraclavicular, or axillary adenopathy. Abdomen soft, non-tender, normal bowel sounds.  Left Breast: no palpable mass, nipple discharge or bleeding. Right breast: Some edema noted in the breast consistent with chronic lymphedema.  Some erythema to the central portion of the breast but no significant warmth today compared to left breast findings are more consistent with breast lymphedema.  No obvious signs of cellulitis within the breast today..     Lab Findings: Lab Results  Component Value Date   WBC 4.8 02/11/2021   HGB  14.4 02/11/2021   HCT 44.1 02/11/2021   MCV 98.7 02/11/2021   PLT 237 02/11/2021    Radiographic Findings: US BREAST LTD UNI RIGHT INC AXILLA  Result Date: 01/27/2021 CLINICAL DATA:  Patient with history of right breast lumpectomy 06/2020. Presenting today for evaluation of palpable concern within the right breast. EXAM: DIGITAL DIAGNOSTIC UNILATERAL RIGHT MAMMOGRAM WITH TOMOSYNTHESIS AND CAD; ULTRASOUND RIGHT BREAST LIMITED TECHNIQUE: Right digital diagnostic mammography and breast tomosynthesis was performed. The images were evaluated with computer-aided detection.; Targeted ultrasound examination of the right breast was performed COMPARISON:  Previous exam(s). ACR Breast Density Category c: The breast tissue is heterogeneously dense, which may obscure small masses. FINDINGS: Interval postlumpectomy changes right breast. There is skin thickening overlying the right breast, likely reflective of post radiation changes. No new additional findings within the right breast. On physical exam, there is skin thickening and redness involving the majority of the right breast. Targeted ultrasound is performed, showing normal tissue without suspicious mass within the right breast 11:30 o'clock 7 cm from nipple at the patient reported site of palpable concern. There is overlying skin thickening. IMPRESSION: Interval postlumpectomy changes right breast. No evidence for malignancy involving the right breast. Interval skin thickening overlying the anterior  aspect of the right breast with redness on physical exam. Findings are likely secondary to post radiation changes and lymphedema. Recommend continued clinical evaluation and skin punch biopsy as clinically indicated to exclude malignant pathology. RECOMMENDATION: Annual diagnostic mammography 04/2021. Recommend continued clinical evaluation of skin thickening and redness. Consider skin punch biopsy as clinically indicated to exclude malignant pathology. I have  discussed the findings and recommendations with the patient. If applicable, a reminder letter will be sent to the patient regarding the next appointment. BI-RADS CATEGORY  2: Benign. Electronically Signed   By: Lovey Newcomer M.D.   On: 01/27/2021 10:03  MM DIAG BREAST TOMO UNI RIGHT  Result Date: 01/27/2021 CLINICAL DATA:  Patient with history of right breast lumpectomy 06/2020. Presenting today for evaluation of palpable concern within the right breast. EXAM: DIGITAL DIAGNOSTIC UNILATERAL RIGHT MAMMOGRAM WITH TOMOSYNTHESIS AND CAD; ULTRASOUND RIGHT BREAST LIMITED TECHNIQUE: Right digital diagnostic mammography and breast tomosynthesis was performed. The images were evaluated with computer-aided detection.; Targeted ultrasound examination of the right breast was performed COMPARISON:  Previous exam(s). ACR Breast Density Category c: The breast tissue is heterogeneously dense, which may obscure small masses. FINDINGS: Interval postlumpectomy changes right breast. There is skin thickening overlying the right breast, likely reflective of post radiation changes. No new additional findings within the right breast. On physical exam, there is skin thickening and redness involving the majority of the right breast. Targeted ultrasound is performed, showing normal tissue without suspicious mass within the right breast 11:30 o'clock 7 cm from nipple at the patient reported site of palpable concern. There is overlying skin thickening. IMPRESSION: Interval postlumpectomy changes right breast. No evidence for malignancy involving the right breast. Interval skin thickening overlying the anterior aspect of the right breast with redness on physical exam. Findings are likely secondary to post radiation changes and lymphedema. Recommend continued clinical evaluation and skin punch biopsy as clinically indicated to exclude malignant pathology. RECOMMENDATION: Annual diagnostic mammography 04/2021. Recommend continued clinical  evaluation of skin thickening and redness. Consider skin punch biopsy as clinically indicated to exclude malignant pathology. I have discussed the findings and recommendations with the patient. If applicable, a reminder letter will be sent to the patient regarding the next appointment. BI-RADS CATEGORY  2: Benign. Electronically Signed   By: Lovey Newcomer M.D.   On: 01/27/2021 10:03   Impression:  Stage IA, (cT1a, cN0, cM0) Right Breast UOQ, Invasive Ductal Carcinoma with DCIS, ER+ / PR+ / Her2-, Grade 1-2  No evidence of recurrence on clinical exam today.  Recent imaging also confirms no evidence of recurrence.  Exam characteristics today are not consistent with cellulitis.  Do not feel there is a real indication today to consider a skin punch biopsy.  Patient has urinary issues as above and we will order a urinalysis culture and sensitivity to rule out bladder infection is etiology of her issues.  We will also obtain CBC with differential to rule out any white count elevation.  We will also check blood chemistries given her history of renal insufficiency and apparent fluid retention recently.  Plan: Patient will keep her already scheduled appointment in January to follow-up with me.  Also recommended she follow-up with her surgeon Dr. Brantley Stage for further evaluation.  She will meet with her rheumatologist later this week in addition.  She reports having increasing problems with muscle and joint ache and this may be related to her rheumatologic situation or possibly Arimidex.   20 minutes of total time was spent for this  patient encounter, including preparation, face-to-face counseling with the patient and coordination of care, physical exam, and documentation of the encounter. ____________________________________  Blair Promise, PhD, MD   This document serves as a record of services personally performed by Gery Pray, MD. It was created on his behalf by Roney Mans, a trained medical scribe.  The creation of this record is based on the scribe's personal observations and the provider's statements to them. This document has been checked and approved by the attending provider.

## 2021-02-11 NOTE — Telephone Encounter (Signed)
CALLED PATIENT TO INFORM THAT DR. KINARD WOULD LIKE TO SEE HER TODAY @ 1:30 PM, PATIENT AGREED TO COME TO THIS APPT.

## 2021-02-11 NOTE — Progress Notes (Signed)
Margaret Beasley is here today for follow up post radiation to the breast.   Breast Side:right   They completed their radiation on: 09/24/2020   Does the patient complain of any of the following: Post radiation skin issues: right breast warm to touch, reddened  Breast Tenderness: yes Breast Swelling: in right axilla Lymphadema: yes Range of Motion limitations: somewhat decreased Fatigue post radiation: extreme fatigue worse than during surgery, taking 2 hour naps daily Appetite good/fair/poor: poor  Additional comments if applicable:  Unable to lie on right side, nausea, bilateral knee and thigh pain, unable to urinate and doesn't have the urge to. Potassium level is elevated.  Vitals:   02/11/21 1328  BP: 125/78  Pulse: 84  Resp: (!) 22  Temp: (!) 97.2 F (36.2 C)  SpO2: 100%  Weight: 238 lb 3.2 oz (108 kg)  Height: 5\' 3"  (1.6 m)

## 2021-02-12 LAB — URINE CULTURE: Culture: NO GROWTH

## 2021-02-19 ENCOUNTER — Telehealth: Payer: Self-pay | Admitting: *Deleted

## 2021-02-19 DIAGNOSIS — E875 Hyperkalemia: Secondary | ICD-10-CM | POA: Diagnosis not present

## 2021-02-19 DIAGNOSIS — R7989 Other specified abnormal findings of blood chemistry: Secondary | ICD-10-CM | POA: Diagnosis not present

## 2021-02-19 DIAGNOSIS — M05772 Rheumatoid arthritis with rheumatoid factor of left ankle and foot without organ or systems involvement: Secondary | ICD-10-CM | POA: Diagnosis not present

## 2021-02-19 DIAGNOSIS — M05771 Rheumatoid arthritis with rheumatoid factor of right ankle and foot without organ or systems involvement: Secondary | ICD-10-CM | POA: Diagnosis not present

## 2021-02-19 NOTE — Telephone Encounter (Signed)
CALLED PATIENT TO ALTER FU ON 02-26-21 DUE TO DR. KINARD BEING IN THE OR, RESCHEDULED FOR 9:15 AM ON 02-26-21, PATIENT AGREED TO NEW TIME ON 02-26-21

## 2021-02-23 ENCOUNTER — Ambulatory Visit: Payer: 59 | Admitting: Radiation Oncology

## 2021-02-25 ENCOUNTER — Other Ambulatory Visit: Payer: 59

## 2021-02-25 NOTE — Progress Notes (Incomplete)
Radiation Oncology         [phone] ________________________________  Name: Margaret Beasley MRN: [id number]  Date: 02/26/2021  DOB: 05-Mar-1955  Follow-Up Visit Note  CC: [name], MD  Nicholas Lose, MD  No diagnosis found.  Diagnosis:  Stage IA, (cT1a, cN0, cM0) Right Breast UOQ, Invasive Ductal Carcinoma with DCIS, ER+ / PR+ / Her2-, Grade 1-2  Interval Since Last Radiation: 5 months and 2 days   Intent: Curative   Radiation Treatment Dates: 08/26/2020 through 09/24/2020 Site Technique Total Dose (Gy) Dose per Fx (Gy) Completed Fx Beam Energies  Breast, Right: Breast_Rt 3D 40.05/40.05 2.67 15/15 10X, 15X  Breast, Right: Breast_Rt_Bst 3D 12/12 2 6/6 6X, 15X    Narrative:  The patient returns today for routine follow-up, she was last seen here for follow-up on 02/11/21. To review from out last visit,  the patient reported urinary issues so we ordered a urinalysis culture and sensitivity to rule out bladder infection.  We also obtained CBC with differential to rule out any white count elevation, and checked blood chemistries given her history of renal insufficiency and apparent fluid retention recently. I also recommended that the patient follow up with Dr. Brantley Stage for further evaluation after our last appointment.               Urinalysis performed during out last visit came back negative, CBC was normal, and blood chemistries appeared within near normal limits overall.                Allergies:  is allergic to opium, topiramate, benadryl [diphenhydramine hcl], butorphanol, celecoxib, latex, penicillins, gabapentin, oxycodone-acetaminophen, requip [ropinirole], stadol [butorphanol tartrate], topamax, and tramadol.  Meds: Current Outpatient Medications  Medication Sig Dispense Refill   acetaminophen (TYLENOL) 650 MG CR tablet Take by mouth.     anastrozole (ARIMIDEX) 1 MG tablet Take 1 tablet (1 mg total) by mouth daily. (Patient not taking: Reported on 01/13/2021)  90 tablet 0   DULoxetine (CYMBALTA) 30 MG capsule Take 3 capsules (90 mg total) by mouth daily. **LUPIN MFR** 270 capsule 1   fluorouracil (EFUDEX) 5 % cream Apply topically 2 (two) times a week. (Patient not taking: Reported on 56/81/2751)     folic acid (FOLVITE) 1 MG tablet Take 1 tablet by mouth daily.     HYDROcodone-acetaminophen (NORCO/VICODIN) 5-325 MG tablet Take 1 tablet by mouth every 6 (six) hours as needed for moderate pain. (Patient not taking: Reported on 01/13/2021) 15 tablet 0   HYDROcodone-acetaminophen (NORCO/VICODIN) 5-325 MG tablet Take 1 tablet by mouth every 6 (six) hours as needed for moderate pain. 15 tablet 0   lamoTRIgine (LAMICTAL) 100 MG tablet Take 100 mg by mouth every evening.     levETIRAcetam (KEPPRA XR) 500 MG 24 hr tablet 4 (four) times daily.      levothyroxine (SYNTHROID) 50 MCG tablet TAKE 1 TABLET BY MOUTH EVERY DAY BEFORE BREAKFAST 90 tablet 1   methotrexate (RHEUMATREX) 2.5 MG tablet Take by mouth.     pantoprazole (PROTONIX) 40 MG tablet Take 1 tablet (40 mg total) by mouth daily. (Patient not taking: Reported on 01/13/2021) 30 tablet 1   promethazine (PHENERGAN) 25 MG suppository Place 1 suppository (25 mg total) rectally every 6 (six) hours as needed for nausea or vomiting. (Patient not taking: Reported on 02/11/2021) 12 each 0   simvastatin (ZOCOR) 40 MG tablet TAKE 1 TABLET BY MOUTH EVERYDAY AT BEDTIME 90 tablet 3   SUMAtriptan (IMITREX) 100 MG tablet TAKE 1  TABLET (100 MG TOTAL) BY MOUTH EVERY 2 (TWO) HOURS AS NEEDED FOR MIGRAINE. (Patient not taking: Reported on 02/11/2021) 30 tablet 3   No current facility-administered medications for this encounter.    Physical Findings: The patient is in no acute distress. Patient is alert and oriented.  vitals were not taken for this visit. .  No significant changes. Lungs are clear to auscultation bilaterally. Heart has regular rate and rhythm. No palpable cervical, supraclavicular, or axillary adenopathy.  Abdomen soft, non-tender, normal bowel sounds.   Lab Findings: Lab Results  Component Value Date   WBC 4.8 02/11/2021   HGB 14.4 02/11/2021   HCT 44.1 02/11/2021   MCV 98.7 02/11/2021   PLT 237 02/11/2021    Radiographic Findings: US BREAST LTD UNI RIGHT INC AXILLA  Result Date: 01/27/2021 CLINICAL DATA:  Patient with history of right breast lumpectomy 06/2020. Presenting today for evaluation of palpable concern within the right breast. EXAM: DIGITAL DIAGNOSTIC UNILATERAL RIGHT MAMMOGRAM WITH TOMOSYNTHESIS AND CAD; ULTRASOUND RIGHT BREAST LIMITED TECHNIQUE: Right digital diagnostic mammography and breast tomosynthesis was performed. The images were evaluated with computer-aided detection.; Targeted ultrasound examination of the right breast was performed COMPARISON:  Previous exam(s). ACR Breast Density Category c: The breast tissue is heterogeneously dense, which may obscure small masses. FINDINGS: Interval postlumpectomy changes right breast. There is skin thickening overlying the right breast, likely reflective of post radiation changes. No new additional findings within the right breast. On physical exam, there is skin thickening and redness involving the majority of the right breast. Targeted ultrasound is performed, showing normal tissue without suspicious mass within the right breast 11:30 o'clock 7 cm from nipple at the patient reported site of palpable concern. There is overlying skin thickening. IMPRESSION: Interval postlumpectomy changes right breast. No evidence for malignancy involving the right breast. Interval skin thickening overlying the anterior aspect of the right breast with redness on physical exam. Findings are likely secondary to post radiation changes and lymphedema. Recommend continued clinical evaluation and skin punch biopsy as clinically indicated to exclude malignant pathology. RECOMMENDATION: Annual diagnostic mammography 04/2021. Recommend continued clinical evaluation  of skin thickening and redness. Consider skin punch biopsy as clinically indicated to exclude malignant pathology. I have discussed the findings and recommendations with the patient. If applicable, a reminder letter will be sent to the patient regarding the next appointment. BI-RADS CATEGORY  2: Benign. Electronically Signed   By: Lovey Newcomer M.D.   On: 01/27/2021 10:03  MM DIAG BREAST TOMO UNI RIGHT  Result Date: 01/27/2021 CLINICAL DATA:  Patient with history of right breast lumpectomy 06/2020. Presenting today for evaluation of palpable concern within the right breast. EXAM: DIGITAL DIAGNOSTIC UNILATERAL RIGHT MAMMOGRAM WITH TOMOSYNTHESIS AND CAD; ULTRASOUND RIGHT BREAST LIMITED TECHNIQUE: Right digital diagnostic mammography and breast tomosynthesis was performed. The images were evaluated with computer-aided detection.; Targeted ultrasound examination of the right breast was performed COMPARISON:  Previous exam(s). ACR Breast Density Category c: The breast tissue is heterogeneously dense, which may obscure small masses. FINDINGS: Interval postlumpectomy changes right breast. There is skin thickening overlying the right breast, likely reflective of post radiation changes. No new additional findings within the right breast. On physical exam, there is skin thickening and redness involving the majority of the right breast. Targeted ultrasound is performed, showing normal tissue without suspicious mass within the right breast 11:30 o'clock 7 cm from nipple at the patient reported site of palpable concern. There is overlying skin thickening. IMPRESSION: Interval postlumpectomy changes right breast. No  evidence for malignancy involving the right breast. Interval skin thickening overlying the anterior aspect of the right breast with redness on physical exam. Findings are likely secondary to post radiation changes and lymphedema. Recommend continued clinical evaluation and skin punch biopsy as clinically indicated  to exclude malignant pathology. RECOMMENDATION: Annual diagnostic mammography 04/2021. Recommend continued clinical evaluation of skin thickening and redness. Consider skin punch biopsy as clinically indicated to exclude malignant pathology. I have discussed the findings and recommendations with the patient. If applicable, a reminder letter will be sent to the patient regarding the next appointment. BI-RADS CATEGORY  2: Benign. Electronically Signed   By: Lovey Newcomer M.D.   On: 01/27/2021 10:03   Impression: Stage IA, (cT1a, cN0, cM0) Right Breast UOQ, Invasive Ductal Carcinoma with DCIS, ER+ / PR+ / Her2-, Grade 1-2  The patient is recovering from the effects of radiation.  ***  Plan:  ***   *** minutes of total time was spent for this patient encounter, including preparation, face-to-face counseling with the patient and coordination of care, physical exam, and documentation of the encounter. ____________________________________  Blair Promise, PhD, MD   This document serves as a record of services personally performed by Gery Pray, MD. It was created on his behalf by Roney Mans, a trained medical scribe. The creation of this record is based on the scribe's personal observations and the provider's statements to them. This document has been checked and approved by the attending provider.

## 2021-02-26 ENCOUNTER — Ambulatory Visit
Admission: RE | Admit: 2021-02-26 | Discharge: 2021-02-26 | Disposition: A | Discharge status: Home / Self Care | Payer: 59 | Source: Ambulatory Visit | Attending: Radiation Oncology | Admitting: Radiation Oncology

## 2021-03-22 NOTE — Progress Notes (Signed)
Radiation Oncology         [phone] ________________________________  Name: Margaret Beasley MRN: [id number]  Date: 03/23/2021  DOB: 12-18-1955  Follow-Up Visit Note  CC: [name], MD  Nicholas Lose, MD    ICD-10-CM   1. Malignant neoplasm of upper-outer quadrant of right breast in female, estrogen receptor positive ([place])  C50.411    Z17.0     2. Ductal carcinoma in situ (DCIS) of left breast  D05.12       Diagnosis:  Stage IA, (cT1a, cN0, cM0) Right Breast UOQ, Invasive Ductal Carcinoma with DCIS, ER+ / PR+ / Her2-, Grade 1-2  Interval Since Last Radiation: 5 months and 27 days   Intent: Curative   Radiation Treatment Dates: 08/26/2020 through 09/24/2020 Site Technique Total Dose (Gy) Dose per Fx (Gy) Completed Fx Beam Energies  Breast, Right: Breast_Rt 3D 40.05/40.05 2.67 15/15 10X, 15X  Breast, Right: Breast_Rt_Bst 3D 12/12 2 6/6 6X, 15X    Narrative:  The patient returns today for routine follow-up, she was last seen here for follow-up on 02/11/21. To review from out last visit,  the patient reported urinary issues so we ordered a urinalysis culture and sensitivity to rule out bladder infection. We also obtained CBC with differential to rule out any white count elevation, and checked blood chemistries given her history of renal insufficiency and apparent fluid retention recently. I also recommended that the patient follow up with Dr. Brantley Stage for further evaluation after our last appointment.               Urinalysis performed during our last visit came back negative, CBC was normal, and blood chemistries appeared within near normal limits overall.         The patient reports seeing her rheumatologist after our last follow-up.  She was told that her inflammation was up and he is increased her methotrexate.  She also received some injections in the ankle region which helped with her mobility issues.  She continues to complain of excessive fatigue which has been  present prior to her surgery and radiation therapy.  I recommended she follow-up with her primary care physician concerning this issue.  Unsure if she has had her thyroid checked recently.  She denies any further pain within the right breast area nipple discharge or bleeding.  She reports the swelling has gone down particular in the low axillary region.  She continues to do breast lymphedema exercises every night which has been helpful.  She denies any pain within the left breast nipple discharge or bleeding.  She is working on getting her left mammogram scheduled.         Allergies:  is allergic to opium, topiramate, benadryl [diphenhydramine hcl], butorphanol, celecoxib, latex, penicillins, gabapentin, oxycodone-acetaminophen, requip [ropinirole], stadol [butorphanol tartrate], topamax, and tramadol.  Meds: Current Outpatient Medications  Medication Sig Dispense Refill   acetaminophen (TYLENOL) 650 MG CR tablet Take by mouth.     DULoxetine (CYMBALTA) 30 MG capsule Take 3 capsules (90 mg total) by mouth daily. **LUPIN MFR** 979 capsule 1   folic acid (FOLVITE) 1 MG tablet Take 1 tablet by mouth daily.     HYDROcodone-acetaminophen (NORCO/VICODIN) 5-325 MG tablet Take 1 tablet by mouth every 6 (six) hours as needed for moderate pain. 15 tablet 0   HYDROcodone-acetaminophen (NORCO/VICODIN) 5-325 MG tablet Take 1 tablet by mouth every 6 (six) hours as needed for moderate pain. 15 tablet 0   lamoTRIgine (LAMICTAL) 100 MG tablet Take 100 mg  by mouth every evening.     levETIRAcetam (KEPPRA XR) 500 MG 24 hr tablet 4 (four) times daily.      levothyroxine (SYNTHROID) 50 MCG tablet TAKE 1 TABLET BY MOUTH EVERY DAY BEFORE BREAKFAST 90 tablet 1   methotrexate (RHEUMATREX) 2.5 MG tablet Take by mouth. 8 tabs per week     promethazine (PHENERGAN) 25 MG suppository Place 1 suppository (25 mg total) rectally every 6 (six) hours as needed for nausea or vomiting. 12 each 0   simvastatin (ZOCOR) 40 MG tablet  TAKE 1 TABLET BY MOUTH EVERYDAY AT BEDTIME 90 tablet 3   SUMAtriptan (IMITREX) 100 MG tablet TAKE 1 TABLET (100 MG TOTAL) BY MOUTH EVERY 2 (TWO) HOURS AS NEEDED FOR MIGRAINE. 30 tablet 3   anastrozole (ARIMIDEX) 1 MG tablet Take 1 tablet (1 mg total) by mouth daily. (Patient not taking: Reported on 01/13/2021) 90 tablet 0   fluorouracil (EFUDEX) 5 % cream Apply topically 2 (two) times a week. (Patient not taking: Reported on 01/13/2021)     No current facility-administered medications for this encounter.    Physical Findings: The patient is in no acute distress. Patient is alert and oriented.  height is $RemoveB'5\' 3"'YaMToPXT$  (1.6 m) and weight is 242 lb 2 oz (109.8 kg). Her temporal temperature is 96.1 F (35.6 C) (abnormal). Her blood pressure is 148/93 (abnormal) and her pulse is 97. Her respiration is 18 and oxygen saturation is 98%. .   Lungs are clear to auscultation bilaterally. Heart has regular rate and rhythm. No palpable cervical, supraclavicular, or axillary adenopathy. Abdomen soft, non-tender, normal bowel sounds.  Left Breast: no palpable mass, nipple discharge or bleeding.  Tattoos in place from her previous radiation therapy Right Breast: Overall swelling is decreased.  She continues to have some erythema centrally within the breast area consistent with lymphedema.  She does have some lymphedema most notable when lying flat particularly in the nipple areolar complex area.  No dominant mass appreciated in the breast nipple discharge or bleeding.  No signs of infection within the breast.   Lab Findings: Lab Results  Component Value Date   WBC 4.8 02/11/2021   HGB 14.4 02/11/2021   HCT 44.1 02/11/2021   MCV 98.7 02/11/2021   PLT 237 02/11/2021    Radiographic Findings: No results found.  Impression: Stage IA, (cT1a, cN0, cM0) Right Breast UOQ, Invasive Ductal Carcinoma with DCIS, ER+ / PR+ / Her2-, Grade 1-2  No evidence of recurrence on clinical exam today.  Overall patient breast  discomfort has resolved at this time.  I encouraged her to continue using her lymphedema exercises on a regular basis like she is doing.  She will undergo Sozo measurement later this spring  Plan: Routine follow-up in 6 months.  She will continue on Arimidex.  Encouraged her to follow-up with her surgeon.   15 minutes of total time was spent for this patient encounter, including preparation, face-to-face counseling with the patient and coordination of care, physical exam, and documentation of the encounter. ____________________________________  Blair Promise, PhD, MD   This document serves as a record of services personally performed by Gery Pray, MD. It was created on his behalf by Roney Mans, a trained medical scribe. The creation of this record is based on the scribe's personal observations and the provider's statements to them. This document has been checked and approved by the attending provider.

## 2021-03-23 ENCOUNTER — Ambulatory Visit
Admission: RE | Admit: 2021-03-23 | Discharge: 2021-03-23 | Disposition: A | Discharge status: Home / Self Care | Payer: 59 | Source: Ambulatory Visit | Attending: Radiation Oncology | Admitting: Radiation Oncology

## 2021-03-23 ENCOUNTER — Encounter: Payer: Self-pay | Admitting: Radiation Oncology

## 2021-03-23 ENCOUNTER — Other Ambulatory Visit: Payer: Self-pay

## 2021-03-23 VITALS — BP 148/93 | HR 97 | Temp 96.1°F | Resp 18 | Ht 63.0 in | Wt 242.1 lb

## 2021-03-23 DIAGNOSIS — Z79899 Other long term (current) drug therapy: Secondary | ICD-10-CM | POA: Diagnosis not present

## 2021-03-23 DIAGNOSIS — Z170 Estrogen receptor positive status [ER+]: Secondary | ICD-10-CM | POA: Diagnosis not present

## 2021-03-23 DIAGNOSIS — I890 Lymphedema, not elsewhere classified: Secondary | ICD-10-CM | POA: Insufficient documentation

## 2021-03-23 DIAGNOSIS — Z79811 Long term (current) use of aromatase inhibitors: Secondary | ICD-10-CM | POA: Diagnosis not present

## 2021-03-23 DIAGNOSIS — C50411 Malignant neoplasm of upper-outer quadrant of right female breast: Secondary | ICD-10-CM | POA: Diagnosis present

## 2021-03-23 DIAGNOSIS — D0512 Intraductal carcinoma in situ of left breast: Secondary | ICD-10-CM

## 2021-03-23 NOTE — Progress Notes (Signed)
Margaret Beasley is here today for follow up post radiation to the breast.   Breast Side:Right   They completed their radiation on: 09/24/20  Does the patient complain of any of the following: Post radiation skin issues:  Patient reports skin has healed. Breast Tenderness:  Breast Swelling: no Lymphadema: Continues to do exercises for lymphedema. Range of Motion limitations: no Fatigue post radiation: Continues to have a moderate level of fatigue. Patient states, "something is not right, I feel worse than before my breast surgery."  Appetite good/fair/poor: Good  Additional comments if applicable:   Vitals:   03/23/21 1612  BP: (!) 148/93  Pulse: 97  Resp: 18  Temp: (!) 96.1 F (35.6 C)  TempSrc: Temporal  SpO2: 98%  Weight: 242 lb 2 oz (109.8 kg)  Height: 5\' 3"  (1.6 m)

## 2021-03-24 ENCOUNTER — Other Ambulatory Visit: Payer: Self-pay | Admitting: Radiation Oncology

## 2021-03-24 ENCOUNTER — Telehealth: Payer: Self-pay | Admitting: *Deleted

## 2021-03-24 DIAGNOSIS — Z9889 Other specified postprocedural states: Secondary | ICD-10-CM

## 2021-03-24 NOTE — Telephone Encounter (Signed)
CALLED PATIENT TO INFORM OF FU WITH DR. KINARD ON 09-21-21 @ 4 PM, SPOKE WITH PATIENT AND SHE IS AWARE OF THIS APPT.

## 2021-04-01 ENCOUNTER — Encounter: Payer: Self-pay | Admitting: Gastroenterology

## 2021-04-20 ENCOUNTER — Ambulatory Visit (INDEPENDENT_AMBULATORY_CARE_PROVIDER_SITE_OTHER): Payer: 59 | Admitting: Gastroenterology

## 2021-04-20 ENCOUNTER — Other Ambulatory Visit: Payer: Self-pay

## 2021-04-20 VITALS — BP 132/86 | HR 83 | Ht 63.0 in | Wt 242.5 lb

## 2021-04-20 DIAGNOSIS — R1013 Epigastric pain: Secondary | ICD-10-CM

## 2021-04-20 DIAGNOSIS — R1310 Dysphagia, unspecified: Secondary | ICD-10-CM

## 2021-04-20 DIAGNOSIS — Z1211 Encounter for screening for malignant neoplasm of colon: Secondary | ICD-10-CM

## 2021-04-20 MED ORDER — CLENPIQ 10-3.5-12 MG-GM -GM/160ML PO SOLN: 1.0000 | Freq: Once | ORAL | 0 refills | Status: AC

## 2021-04-20 NOTE — Patient Instructions (Addendum)
If you are age 66 or older, your body mass index should be between 23-30. Your Body mass index is 42.96 kg/m?Marland Kitchen If this is out of the aforementioned range listed, please consider follow up with your Primary Care Provider. ? ?If you are age 40 or younger, your body mass index should be between 19-25. Your Body mass index is 42.96 kg/m?Marland Kitchen If this is out of the aformentioned range listed, please consider follow up with your Primary Care Provider.  ? ?________________________________________________________ ? ?The [facility] providers would like to encourage you to use [facility] to communicate with providers for non-urgent requests or questions.  Due to long hold times on the telephone, sending your provider a message by [facility] may be a faster and more efficient way to get a response.  Please allow 48 business hours for a response.  Please remember that this is for non-urgent requests.  ?_______________________________________________________ ? ?We have sent the following medications to your pharmacy for you to pick up at your convenience: ?Clenpiq ? ?Please call with any questions or concerns. ? ?It was a pleasure to see you today! ? ?Gerrit Heck, D.O. ? ?
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
## 2021-04-21 ENCOUNTER — Other Ambulatory Visit: Payer: Self-pay

## 2021-04-21 ENCOUNTER — Other Ambulatory Visit: Payer: Self-pay | Admitting: Family Medicine

## 2021-04-21 ENCOUNTER — Ambulatory Visit (INDEPENDENT_AMBULATORY_CARE_PROVIDER_SITE_OTHER): Payer: 59 | Admitting: Family Medicine

## 2021-04-21 ENCOUNTER — Encounter: Payer: Self-pay | Admitting: Family Medicine

## 2021-04-21 ENCOUNTER — Ambulatory Visit (INDEPENDENT_AMBULATORY_CARE_PROVIDER_SITE_OTHER): Payer: 59

## 2021-04-21 VITALS — BP 134/77 | HR 84 | Ht 63.0 in | Wt 244.0 lb

## 2021-04-21 DIAGNOSIS — M25812 Other specified joint disorders, left shoulder: Secondary | ICD-10-CM

## 2021-04-21 DIAGNOSIS — Z9189 Other specified personal risk factors, not elsewhere classified: Secondary | ICD-10-CM

## 2021-04-21 DIAGNOSIS — R1310 Dysphagia, unspecified: Secondary | ICD-10-CM | POA: Diagnosis not present

## 2021-04-21 DIAGNOSIS — E039 Hypothyroidism, unspecified: Secondary | ICD-10-CM

## 2021-04-21 DIAGNOSIS — Z853 Personal history of malignant neoplasm of breast: Secondary | ICD-10-CM

## 2021-04-21 NOTE — Assessment & Plan Note (Signed)
She has an endoscopy scheduled on March 23.  Its progressed since I last saw her she is now having difficulty even drinking water sometimes. ?

## 2021-04-21 NOTE — Progress Notes (Signed)
Hi [place],  ?The US shows and "indeterminate" mass.  Basically this means that they were not sure and could not quite classify the tissue.  So they are recommending a CT with contrast of the neck.  If you are okay with moving forward with that we will go ahead and get that ordered and approved with the insurance.
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
## 2021-04-21 NOTE — Assessment & Plan Note (Signed)
Plan to recheck TSH in about 4 weeks.  She went back down to one a tab daily. ?

## 2021-04-23 ENCOUNTER — Other Ambulatory Visit: Payer: Self-pay | Admitting: Surgery

## 2021-04-23 DIAGNOSIS — R222 Localized swelling, mass and lump, trunk: Secondary | ICD-10-CM

## 2021-04-27 ENCOUNTER — Ambulatory Visit (INDEPENDENT_AMBULATORY_CARE_PROVIDER_SITE_OTHER): Payer: 59

## 2021-04-27 ENCOUNTER — Other Ambulatory Visit: Payer: Self-pay

## 2021-04-27 DIAGNOSIS — M7989 Other specified soft tissue disorders: Secondary | ICD-10-CM | POA: Diagnosis not present

## 2021-04-27 DIAGNOSIS — R2232 Localized swelling, mass and lump, left upper limb: Secondary | ICD-10-CM

## 2021-04-27 DIAGNOSIS — R221 Localized swelling, mass and lump, neck: Secondary | ICD-10-CM

## 2021-04-27 DIAGNOSIS — M25812 Other specified joint disorders, left shoulder: Secondary | ICD-10-CM

## 2021-04-27 LAB — I-STAT CREATININE (MANUAL ENTRY): Creatinine, Ser: 1.1 (ref 0.50–1.10)

## 2021-04-27 MED ORDER — IOHEXOL 300 MG/ML  SOLN
100.0000 mL | Freq: Once | INTRAMUSCULAR | Status: AC | PRN
  Administered 2021-04-27: 80 mL via INTRAVENOUS

## 2021-04-28 ENCOUNTER — Telehealth: Payer: Self-pay

## 2021-04-28 DIAGNOSIS — D170 Benign lipomatous neoplasm of skin and subcutaneous tissue of head, face and neck: Secondary | ICD-10-CM

## 2021-04-28 DIAGNOSIS — M25812 Other specified joint disorders, left shoulder: Secondary | ICD-10-CM

## 2021-04-28 NOTE — Progress Notes (Signed)
Hi Margaret Beasley,  ?I apologize for misspelling your name.  I was using my dictation software and just as I hit send I realized it was misspelled.  I apologize.

## 2021-04-28 NOTE — Progress Notes (Signed)
Hi Margaret Beasley, great news!.  The CT did not show any worrisome findings such as a hardened mass or swollen lymph nodes.  The skin marker that they placed was basically over fatty tissue which again is reassuring.  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  Since it is getting larger these can be removed and so if you would like I can always refer you to a general surgeon for consultation. ? ?They did also note just a very small incidental calcification near the anterior jaw on the right side which they felt was most likely a little duct stone in the submandibular gland.  But they did not see any sign of inflammation or obstruction so is probably been there and then typically these are not harmful unless it starts to clog the duct.

## 2021-04-28 NOTE — Telephone Encounter (Signed)
Patient wants a referral to General Surgery ? ?I started the referral ?

## 2021-04-29 ENCOUNTER — Ambulatory Visit
Admission: RE | Admit: 2021-04-29 | Discharge: 2021-04-29 | Disposition: A | Discharge status: Home / Self Care | Payer: 59 | Source: Ambulatory Visit | Attending: Surgery | Admitting: Surgery

## 2021-04-29 DIAGNOSIS — R222 Localized swelling, mass and lump, trunk: Secondary | ICD-10-CM

## 2021-04-29 MED ORDER — IOPAMIDOL (ISOVUE-300) INJECTION 61%
75.0000 mL | Freq: Once | INTRAVENOUS | Status: AC | PRN
  Administered 2021-04-29: 75 mL via INTRAVENOUS

## 2021-04-29 NOTE — Telephone Encounter (Signed)
Pt informed.  Pt expressed understanding and is agreeable.  T. Mavrick Mcquigg, CMA  

## 2021-04-29 NOTE — Telephone Encounter (Signed)
Referral placed.

## 2021-05-01 ENCOUNTER — Telehealth: Payer: Self-pay | Admitting: Radiology

## 2021-05-01 NOTE — Telephone Encounter (Signed)
Patient reports redness and rash to lower abdomen down to between legs. Per Freeman Caldron, PA-C advised patient to contact PCP and if unable to see PCP today she may use monastat until able to make an appointment. Patient verbalized understanding. ?

## 2021-05-06 ENCOUNTER — Ambulatory Visit
Admission: RE | Admit: 2021-05-06 | Discharge: 2021-05-06 | Disposition: A | Discharge status: Home / Self Care | Payer: 59 | Source: Ambulatory Visit | Attending: Radiation Oncology | Admitting: Radiation Oncology

## 2021-05-06 DIAGNOSIS — Z9889 Other specified postprocedural states: Secondary | ICD-10-CM

## 2021-05-07 ENCOUNTER — Encounter: Payer: 59 | Admitting: Gastroenterology

## 2021-05-11 ENCOUNTER — Ambulatory Visit: Payer: 59

## 2021-05-20 ENCOUNTER — Encounter (HOSPITAL_COMMUNITY): Payer: Self-pay

## 2021-05-25 ENCOUNTER — Ambulatory Visit: Payer: 59 | Attending: Surgery

## 2021-05-25 VITALS — Wt 239.0 lb

## 2021-05-25 DIAGNOSIS — Z483 Aftercare following surgery for neoplasm: Secondary | ICD-10-CM | POA: Insufficient documentation
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
## 2021-05-26 ENCOUNTER — Other Ambulatory Visit: Payer: Self-pay

## 2021-05-26 DIAGNOSIS — E038 Other specified hypothyroidism: Secondary | ICD-10-CM

## 2021-05-27 LAB — TSH: TSH: 1.86 mIU/L (ref 0.40–4.50)

## 2021-05-27 NOTE — Progress Notes (Signed)
Your lab work is within acceptable range and there are no concerning findings.   ?

## 2021-06-23 ENCOUNTER — Ambulatory Visit: Payer: 59 | Admitting: Family Medicine

## 2021-07-15 ENCOUNTER — Ambulatory Visit: Payer: 59 | Admitting: Family Medicine

## 2021-07-26 ENCOUNTER — Other Ambulatory Visit: Payer: Self-pay | Admitting: Family Medicine

## 2021-08-01 ENCOUNTER — Other Ambulatory Visit: Payer: Self-pay | Admitting: Family Medicine

## 2021-08-01 DIAGNOSIS — E039 Hypothyroidism, unspecified: Secondary | ICD-10-CM

## 2021-08-12 ENCOUNTER — Other Ambulatory Visit: Payer: Self-pay | Admitting: Family Medicine

## 2021-08-24 ENCOUNTER — Telehealth: Payer: Self-pay

## 2021-08-24 NOTE — Telephone Encounter (Signed)
Received call from patient stating that she recently had elevated CRP- (31) performed at [facility] by Rheumatologist due to patient having RA factor . Patient states she feels extremely fatigued and at times she is unable to walk. Patient states this is how she felt prior to her breast cancer diagnosis. Patient questioning if elevated CRP could be an indicator that her cancer is back. Please advise.

## 2021-09-20 NOTE — Progress Notes (Signed)
Radiation Oncology         [phone] ________________________________  Name: Margaret Beasley MRN: [id number]  Date: 09/21/2021  DOB: 1955-11-30  Follow-Up Visit Note  CC: [name], MD  Nicholas Lose, MD    ICD-10-CM   1. Malignant neoplasm of upper-outer quadrant of right breast in female, estrogen receptor positive ([place])  C50.411 Ambulatory referral to Physical Therapy   Z17.0     2. Malignant neoplasm of upper-outer quadrant of female breast, unspecified estrogen receptor status, unspecified laterality ([place])  C50.419       Diagnosis: Stage IA, (cT1a, cN0, cM0) Right Breast UOQ, Invasive Ductal Carcinoma with DCIS, ER+ / PR+ / Her2-, Grade 1-2  Interval Since Last Radiation: about 1 year   Intent: Curative   Radiation Treatment Dates: 08/26/2020 through 09/24/2020 Site Technique Total Dose (Gy) Dose per Fx (Gy) Completed Fx Beam Energies  Breast, Right: Breast_Rt 3D 40.05/40.05 2.67 15/15 10X, 15X  Breast, Right: Breast_Rt_Bst 3D 12/12 2 6/6 6X, 15X    Narrative:  The patient returns today for routine 6 month follow-up, she was last seen here for follow-up on 03/23/21. Since her last visit, the patient followed up with Dr. Brantley Stage on 04/17/21. During which time, the patient expressed concern regarding an increasing fatty mass at the base of her left neck. Dr. Brantley Stage recommended further work-up via imaging given that the mass appeared to extend down behind her clavicle. Otherwise, the patient denied any other concerns.      Subsequent US of the left upper extremity on 04/21/21 revealed an indeterminate 6.6 cm soft tissue mass in the left supraclavicular area. Soft tissue neck CT on 04/27/21 revealed skin overlying normal appearing supraclavicular fat; consistent with a lipoma. No other concerning abnormalities were appreciated. (Chest CT on 04/29/21 also showed no visible supraclavicular mass, and skin thickening in the right breast likely related to radiation  changes.  Other pertinent imaging thus far includes a bilateral diagnostic mammogram on 05/06/21 which showed no evidence of malignancy within either breast.  Of note: The patient had an episode of altered awareness this past May which prompted an MRI of the head on 07/08/21 which showed normal intracranial findings other than several small foci of T2 hyperintensity in the cerebral white matter (nonspecific). These were noted to be unchanged compared with a prior study from 2019.   She denies any pain within the breast area nipple discharge or bleeding.  She denies any problems with swelling in her right arm or hand.                            Allergies:  is allergic to opium, topiramate, benadryl [diphenhydramine hcl], butorphanol, celecoxib, latex, penicillins, arimidex [anastrozole], gabapentin, oxycodone-acetaminophen, oxycodone-acetaminophen, requip [ropinirole], stadol [butorphanol tartrate], topamax, and tramadol.  Meds: Current Outpatient Medications  Medication Sig Dispense Refill   acetaminophen (TYLENOL) 650 MG CR tablet Take by mouth.     DULoxetine (CYMBALTA) 30 MG capsule TAKE 3 CAPSULES BY MOUTH DAILY 270 capsule 1   fluorouracil (EFUDEX) 5 % cream Apply topically 2 (two) times a week.     folic acid (FOLVITE) 1 MG tablet Take 1 tablet by mouth daily.     HYDROcodone-acetaminophen (NORCO/VICODIN) 5-325 MG tablet Take 1 tablet by mouth every 6 (six) hours as needed for moderate pain. 15 tablet 0   lamoTRIgine (LAMICTAL) 100 MG tablet Take 100 mg by mouth every evening.     levETIRAcetam (KEPPRA XR)  500 MG 24 hr tablet 4 (four) times daily.      levothyroxine (SYNTHROID) 50 MCG tablet TAKE 1 TABLET BY MOUTH EVERY DAY BEFORE BREAKFAST 90 tablet 1   methotrexate (RHEUMATREX) 2.5 MG tablet Take by mouth. 8 tabs per week     promethazine (PHENERGAN) 25 MG suppository Place 1 suppository (25 mg total) rectally every 6 (six) hours as needed for nausea or vomiting. 12 each 0    simvastatin (ZOCOR) 40 MG tablet TAKE 1 TABLET BY MOUTH EVERYDAY AT BEDTIME 90 tablet 0   SUMAtriptan (IMITREX) 100 MG tablet TAKE 1 TABLET (100 MG TOTAL) BY MOUTH EVERY 2 (TWO) HOURS AS NEEDED FOR MIGRAINE. 30 tablet 3   No current facility-administered medications for this encounter.    Physical Findings: The patient is in no acute distress. Patient is alert and oriented.  height is _0  (1.6 m) and weight is 240 lb 3.2 oz (109 kg). Her temperature is 97.8 F (36.6 C). Her blood pressure is 118/76 and her pulse is 84. Her respiration is 20 and oxygen saturation is 97%. .  No significant changes. Lungs are clear to auscultation bilaterally. Heart has regular rate and rhythm. No palpable cervical, supraclavicular, or axillary adenopathy. Abdomen soft, non-tender, normal bowel sounds.  Left Breast: no palpable mass, nipple discharge or bleeding. Right Breast: Mild erythema noted centrally within the breast which has been noticed on previous exams.  Patient does have some edema in the nipple areolar complex area.  Mild hyperpigmentation changes throughout the right breast.  No dominant mass appreciated in the breast nipple discharge or bleeding. I am unable to palpate any abnormality in the right axillary area   Lab Findings: Lab Results  Component Value Date   WBC 4.8 02/11/2021   HGB 14.4 02/11/2021   HCT 44.1 02/11/2021   MCV 98.7 02/11/2021   PLT 237 02/11/2021    Radiographic Findings: No results found.  Impression:  Stage IA, (cT1a, cN0, cM0) Right Breast UOQ, Invasive Ductal Carcinoma with DCIS, ER+ / PR+ / Her2-, Grade 1-2  No evidence of recurrence on clinical exam today.  She does have some edema within the breast area and we discussed potential referral to physical therapy for this issue.  She does report doing manual massages but I feel she would benefit from formal evaluation of this issue and instruction.  She does wish to proceed with referral to physical therapy.  She  will be undergoing the Sozo measurement in the next several days.  Plan: Routine follow-up in 1 year.  She will be seeing Dr. Brantley Stage in approximately 6 months.  Referral to physical therapy as above.   22 minutes of total time was spent for this patient encounter, including preparation, face-to-face counseling with the patient and coordination of care, physical exam, and documentation of the encounter. ____________________________________  Blair Promise, PhD, MD  This document serves as a record of services personally performed by Gery Pray, MD. It was created on his behalf by Roney Mans, a trained medical scribe. The creation of this record is based on the scribe's personal observations and the provider's statements to them. This document has been checked and approved by the attending provider.

## 2021-09-21 ENCOUNTER — Ambulatory Visit
Admission: RE | Admit: 2021-09-21 | Discharge: 2021-09-21 | Disposition: A | Discharge status: Home / Self Care | Payer: 59 | Source: Ambulatory Visit | Attending: Radiation Oncology | Admitting: Radiation Oncology

## 2021-09-21 VITALS — BP 118/76 | HR 84 | Temp 97.8°F | Resp 20 | Ht 63.0 in | Wt 240.2 lb

## 2021-09-21 DIAGNOSIS — Z79899 Other long term (current) drug therapy: Secondary | ICD-10-CM | POA: Diagnosis not present

## 2021-09-21 DIAGNOSIS — R609 Edema, unspecified: Secondary | ICD-10-CM | POA: Diagnosis not present

## 2021-09-21 DIAGNOSIS — C50411 Malignant neoplasm of upper-outer quadrant of right female breast: Secondary | ICD-10-CM

## 2021-09-21 DIAGNOSIS — Z923 Personal history of irradiation: Secondary | ICD-10-CM | POA: Diagnosis not present

## 2021-09-21 DIAGNOSIS — Z170 Estrogen receptor positive status [ER+]: Secondary | ICD-10-CM

## 2021-09-21 DIAGNOSIS — Z79890 Hormone replacement therapy (postmenopausal): Secondary | ICD-10-CM | POA: Insufficient documentation

## 2021-09-21 DIAGNOSIS — C50419 Malignant neoplasm of upper-outer quadrant of unspecified female breast: Secondary | ICD-10-CM

## 2021-09-21 NOTE — Progress Notes (Signed)
Clarnce Flock is here today for follow up post radiation to the breast.   Breast Side:Right   They completed their radiation on: 09/24/20   Does the patient complain of any of the following: Post radiation skin issues: no Breast Tenderness: no Breast Swelling: no Lymphadema: no Range of Motion limitations: no Fatigue post radiation: yes due to RA Appetite good/fair/poor: fair.  She also reports having nasuea on and off during the day.  Additional comments if applicable: Reports she has felt a lump under her right arm.  She is going to have a block done for her knee pain in a month.  If it helps then they will do a nerve block.  BP 118/76 (BP Location: Left Wrist, Patient Position: Sitting, Cuff Size: Normal)   Pulse 84   Temp 97.8 F (36.6 C)   Resp 20   Ht '5\' 3"'$  (1.6 m)   Wt 240 lb 3.2 oz (109 kg)   SpO2 97%   BMI 42.55 kg/m

## 2021-09-25 ENCOUNTER — Encounter: Payer: Self-pay | Admitting: Rehabilitation

## 2021-09-25 ENCOUNTER — Ambulatory Visit: Payer: 59 | Attending: Radiation Oncology | Admitting: Rehabilitation

## 2021-09-25 ENCOUNTER — Other Ambulatory Visit: Payer: Self-pay

## 2021-09-25 DIAGNOSIS — C50411 Malignant neoplasm of upper-outer quadrant of right female breast: Secondary | ICD-10-CM | POA: Diagnosis present

## 2021-09-25 DIAGNOSIS — Z483 Aftercare following surgery for neoplasm: Secondary | ICD-10-CM | POA: Insufficient documentation

## 2021-09-25 DIAGNOSIS — I890 Lymphedema, not elsewhere classified: Secondary | ICD-10-CM | POA: Diagnosis present

## 2021-09-25 DIAGNOSIS — R293 Abnormal posture: Secondary | ICD-10-CM | POA: Insufficient documentation

## 2021-09-25 DIAGNOSIS — Z170 Estrogen receptor positive status [ER+]: Secondary | ICD-10-CM | POA: Diagnosis present
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
## 2021-09-30 ENCOUNTER — Ambulatory Visit: Payer: 59

## 2021-09-30 DIAGNOSIS — Z483 Aftercare following surgery for neoplasm: Secondary | ICD-10-CM

## 2021-09-30 DIAGNOSIS — C50411 Malignant neoplasm of upper-outer quadrant of right female breast: Secondary | ICD-10-CM

## 2021-09-30 DIAGNOSIS — R293 Abnormal posture: Secondary | ICD-10-CM

## 2021-09-30 DIAGNOSIS — I890 Lymphedema, not elsewhere classified: Secondary | ICD-10-CM
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
## 2021-10-07 ENCOUNTER — Ambulatory Visit: Payer: 59 | Admitting: Rehabilitation

## 2021-10-07 ENCOUNTER — Encounter: Payer: Self-pay | Admitting: General Practice

## 2021-10-08 ENCOUNTER — Encounter: Payer: Self-pay | Admitting: Family Medicine

## 2021-10-08 ENCOUNTER — Ambulatory Visit (INDEPENDENT_AMBULATORY_CARE_PROVIDER_SITE_OTHER): Payer: 59 | Admitting: Family Medicine

## 2021-10-08 VITALS — BP 138/81 | HR 90 | Ht 63.0 in | Wt 241.0 lb

## 2021-10-08 DIAGNOSIS — Z0000 Encounter for general adult medical examination without abnormal findings: Secondary | ICD-10-CM

## 2021-10-08 MED ORDER — SIMVASTATIN 40 MG PO TABS: ORAL_TABLET | ORAL | 3 refills | Status: DC

## 2021-10-08 NOTE — Progress Notes (Signed)
Complete physical exam  Patient: Margaret Beasley   DOB: [birth year]   66 y.o. Female  MRN: [id number]  Subjective:    Chief Complaint  Patient presents with   Annual Exam    [name] is a 66 y.o. female who presents today for a complete physical exam. She reports consuming a general diet. Exercise is limited by orthopedic condition(s): knee OA. She generally feels poorly. She reports sleeping poorly. She does not have additional problems to discuss today.   Her serum creatinine has been slowly trending upward and her rheumatologist has been following routinely because of her medications so he has referred her to a nephrologist her consultation is coming up next month.  She is concerned about it.  She never followed up with GI for this dysphagia.  So she plans on getting back to that after she has her evaluation with a nephrologist.  Knee pains been quite severe and has been limiting her activity and even just her daily life around the house sometimes the pain is so severe that she can barely move.  She does follow with pain management and they are actually planning on doing some injections around the nerves and if that works well then she will likely have a nerve ablation.  She really ultimately needs knee replacement.  Actively engaged in lymphedema therapy for her chest and arm.  She also reports that she is lost about 3 pounds in the last month not trying to lose weight.  Followed by rheumatology for possible rheumatoid.  They have her on methotrexate.  They have been following her labs regularly.  They have seen a shift in her serum creatinine.  Most recent fall risk assessment:    10/08/2021    3:01 PM  [place] in the past year? 0  Number falls in past yr: 0  Injury with Fall? 0  Risk for fall due to : No Fall Risks  Follow up Falls evaluation completed     Most recent depression screenings:    10/08/2021    3:00 PM 06/29/2018   12:44 PM  PHQ 2/9 Scores   PHQ - 2 Score 4 0  PHQ- 9 Score 12       Past Surgical History:  Procedure Laterality Date   APPENDECTOMY  01-15-2010   BREAST BIOPSY     BREAST LUMPECTOMY  02/10/2011   Procedure: LUMPECTOMY;  Surgeon: Harl Bowie, MD;  Location: [place];  Service: General;  Laterality: Left;  needle localized left breast lumpectomy   BREAST LUMPECTOMY WITH RADIOACTIVE SEED AND SENTINEL LYMPH NODE BIOPSY Right 06/26/2020   Procedure: RIGHT BREAST LUMPECTOMY WITH RADIOACTIVE SEED AND SENTINEL LYMPH NODE BIOPSY;  Surgeon: Erroll Luna, MD;  Location: [place];  Service: General;  Laterality: Right;   [place], 1983   RIGHT KNEE  MENISCUS TEAR   TUBAL LIGATION     Social History   Tobacco Use   Smoking status: Never   Smokeless tobacco: Never  Vaping Use   Vaping Use: Never used  Substance Use Topics   Alcohol use: No   Drug use: No   Allergies  Allergen Reactions   Opium     Other reaction(s): Seizures All opiods/able to take VIcodin and Percocet Other reaction(s): Seizures All opiods/able to take VIcodin and Percocet   Topiramate  Other reaction(s): Seizures   Benadryl [Diphenhydramine Hcl] Other (See Comments)    Seizure.   Butorphanol     Other reaction(s): Unknown Other reaction(s): Unknown   Celecoxib     Other reaction(s): Other Stomach aches   Latex     Other reaction(s): Other Latex gloves    Penicillins     Other reaction(s): Unknown Childhood reaction Other reaction(s): Unknown Childhood reaction   Arimidex [Anastrozole] Nausea And Vomiting   Gabapentin Other (See Comments) and Nausea Only    seizure. Ended up in ED after 3 days on the medication.  Other reaction(s): Other (See Comments) Other reaction(s): Other (See Comments) Vocal spasms and seizure. Ended up in ED after 3 days on the medication.  Other reaction(s): Other (See Comments) Vocal  spasms and seizure. Ended up in ED after 3 days on the medication.     Oxycodone-Acetaminophen     And most opioids   Oxycodone-Acetaminophen     Other reaction(s): Headache And most opioids   Requip [Ropinirole] Other (See Comments)    Headache, vivid dreams    Stadol [Butorphanol Tartrate]    Topamax    Tramadol       Patient Care Team: [name], MD as PCP - General (Family Medicine) Penny Pia (Inactive) as Referring Physician (Neurology) Mauro Kaufmann, RN as Oncology Nurse Navigator Rockwell Germany, RN as Oncology Nurse Navigator Erroll Luna, MD as Consulting Physician (General Surgery) Nicholas Lose, MD as Consulting Physician (Hematology and Oncology) Gery Pray, MD as Consulting Physician (Radiation Oncology)   Outpatient Medications Prior to Visit  Medication Sig   acetaminophen (TYLENOL) 650 MG CR tablet Take by mouth.   DULoxetine (CYMBALTA) 30 MG capsule TAKE 3 CAPSULES BY MOUTH DAILY   fluorouracil (EFUDEX) 5 % cream Apply topically 2 (two) times a week.   folic acid (FOLVITE) 1 MG tablet Take 1 tablet by mouth daily.   HYDROcodone-acetaminophen (NORCO/VICODIN) 5-325 MG tablet Take 1 tablet by mouth every 6 (six) hours as needed for moderate pain.   lamoTRIgine (LAMICTAL) 100 MG tablet Take 100 mg by mouth every evening.   levETIRAcetam (KEPPRA XR) 500 MG 24 hr tablet 4 (four) times daily.    levothyroxine (SYNTHROID) 50 MCG tablet TAKE 1 TABLET BY MOUTH EVERY DAY BEFORE BREAKFAST   methotrexate (RHEUMATREX) 2.5 MG tablet Take by mouth. 4 tablets a week.   promethazine (PHENERGAN) 25 MG suppository Place 1 suppository (25 mg total) rectally every 6 (six) hours as needed for nausea or vomiting.   SUMAtriptan (IMITREX) 100 MG tablet TAKE 1 TABLET (100 MG TOTAL) BY MOUTH EVERY 2 (TWO) HOURS AS NEEDED FOR MIGRAINE.   [DISCONTINUED] simvastatin (ZOCOR) 40 MG tablet TAKE 1 TABLET BY MOUTH EVERYDAY AT BEDTIME   No facility-administered  medications prior to visit.    ROS        Objective:     BP 138/81   Pulse 90   Ht '5\' 3"'$  (1.6 m)   Wt 241 lb (109.3 kg)   SpO2 95%   BMI 42.69 kg/m    Physical Exam Vitals and nursing note reviewed.  Constitutional:      Appearance: She is well-developed.  HENT:     Head: Normocephalic and atraumatic.     Right Ear: Tympanic membrane, ear canal and external ear normal.     Left Ear: Tympanic membrane, ear canal and external ear normal.     Nose: Nose normal.  Eyes:     Conjunctiva/sclera: Conjunctivae normal.  Pupils: Pupils are equal, round, and reactive to light.  Neck:     Thyroid: No thyromegaly.  Cardiovascular:     Rate and Rhythm: Normal rate and regular rhythm.     Heart sounds: Normal heart sounds.  Pulmonary:     Effort: Pulmonary effort is normal.     Breath sounds: Normal breath sounds. No wheezing.  Abdominal:     General: Bowel sounds are normal.     Palpations: Abdomen is soft.  Musculoskeletal:     Cervical back: Neck supple.  Lymphadenopathy:     Cervical: No cervical adenopathy.  Skin:    General: Skin is warm and dry.  Neurological:     Mental Status: She is alert and oriented to person, place, and time.  Psychiatric:        Behavior: Behavior normal.      No results found for any visits on 10/08/21.     Assessment & Plan:    Routine Health Maintenance and Physical Exam  Immunization History  Administered Date(s) Administered   Influenza,inj,Quad PF,6+ Mos 12/17/2015   Moderna Sars-Covid-2 Vaccination 07/31/2019, 08/31/2019, 10/19/2019   Tdap 08/22/2013    Health Maintenance  Topic Date Due   COLONOSCOPY (Pts 45-34yr Insurance coverage will need to be confirmed)  12/23/2021 (Originally 02/01/2001)   COVID-19 Vaccine (4 - Moderna risk series) 12/23/2021 (Originally 12/14/2019)   Zoster Vaccines- Shingrix (1 of 2) 12/23/2021 (Originally 02/02/1975)   INFLUENZA VACCINE  05/16/2022 (Originally 09/15/2021)   Pneumonia  Vaccine 66 Years old (1 - PCV) 10/09/2022 (Originally 02/01/2021)   PAP SMEAR-Modifier  05/03/2023   MAMMOGRAM  05/07/2023   TETANUS/TDAP  08/23/2023   DEXA SCAN  Completed   Hepatitis C Screening  Completed   HIV Screening  Completed   HPV VACCINES  Aged Out    Discussed health benefits of physical activity, and encouraged her to engage in regular exercise appropriate for her age and condition.  Problem List Items Addressed This Visit   None Visit Diagnoses     Wellness examination    -  Primary   Relevant Orders   TSH   Lipid Panel w/reflex Direct LDL   COMPLETE METABOLIC PANEL WITH GFR       Keep up a regular exercise program and make sure you are eating a healthy diet Try to eat 4 servings of dairy a day, or if you are lactose intolerant take a calcium with vitamin D daily.  Declined flu vaccine and pneumonia vaccine today.  Meds ordered this encounter  Medications   simvastatin (ZOCOR) 40 MG tablet    Sig: TAKE 1 TABLET BY MOUTH EVERYDAY AT BEDTIME    Dispense:  90 tablet    Refill:  3     Return in about 6 months (around 04/10/2022) for Cymbalta.     CBeatrice Lecher MD

## 2021-10-12 ENCOUNTER — Ambulatory Visit: Payer: 59

## 2021-10-13 ENCOUNTER — Telehealth: Payer: Self-pay | Admitting: Family Medicine

## 2021-10-13 DIAGNOSIS — E039 Hypothyroidism, unspecified: Secondary | ICD-10-CM

## 2021-10-13 LAB — COMPLETE METABOLIC PANEL WITH GFR
AG Ratio: 1.5 (calc) (ref 1.0–2.5)
ALT: 22 U/L (ref 6–29)
AST: 20 U/L (ref 10–35)
Albumin: 4 g/dL (ref 3.6–5.1)
Alkaline phosphatase (APISO): 72 U/L (ref 37–153)
BUN/Creatinine Ratio: 8 (calc) (ref 6–22)
BUN: 9 mg/dL (ref 7–25)
CO2: 29 mmol/L (ref 20–32)
Calcium: 9.4 mg/dL (ref 8.6–10.4)
Chloride: 104 mmol/L (ref 98–110)
Creat: 1.13 mg/dL — ABNORMAL HIGH (ref 0.50–1.05)
Globulin: 2.6 g/dL (calc) (ref 1.9–3.7)
Glucose, Bld: 109 mg/dL — ABNORMAL HIGH (ref 65–99)
Potassium: 5.2 mmol/L (ref 3.5–5.3)
Sodium: 144 mmol/L (ref 135–146)
Total Bilirubin: 0.5 mg/dL (ref 0.2–1.2)
Total Protein: 6.6 g/dL (ref 6.1–8.1)
eGFR: 54 mL/min/{1.73_m2} — ABNORMAL LOW (ref >=60)

## 2021-10-13 LAB — LIPID PANEL W/REFLEX DIRECT LDL
Cholesterol: 156 mg/dL (ref <200)
HDL: 55 mg/dL (ref >=50)
LDL Cholesterol (Calc): 74 mg/dL (calc)
Non-HDL Cholesterol (Calc): 101 mg/dL (calc) (ref <130)
Total CHOL/HDL Ratio: 2.8 (calc) (ref <5.0)
Triglycerides: 172 mg/dL — ABNORMAL HIGH (ref <150)

## 2021-10-13 LAB — TSH: TSH: 7.02 mIU/L — ABNORMAL HIGH (ref 0.40–4.50)

## 2021-10-13 MED ORDER — LEVOTHYROXINE SODIUM 50 MCG PO TABS: 50.0000 ug | ORAL_TABLET | Freq: Every day | ORAL | 1 refills | Status: DC

## 2021-10-13 NOTE — Telephone Encounter (Signed)
Meds ordered this encounter  Medications   levothyroxine (SYNTHROID) 50 MCG tablet    Sig: Take 1 tablet (50 mcg total) by mouth daily before breakfast.    Dispense:  90 tablet    Refill:  1

## 2021-10-13 NOTE — Progress Notes (Signed)
Hi Isra, your thyroid has shifted dramatically.  Did you run out or miss any doses recently?  Based on your number it looks like we may need to adjust your medication.  Is just verify for me if you do not mind.  Your cholesterol looks phenomenal!  Doristine Devoid work!  Kidney function is stable.  Liver enzymes are normal.

## 2021-10-13 NOTE — Telephone Encounter (Signed)
Pt informed of RX.  Charyl Bigger, CMA

## 2021-10-13 NOTE — Telephone Encounter (Signed)
Pt called.  She wants to get back on thyroid med.  Please send script to [facility] on [address].

## 2021-10-14 ENCOUNTER — Ambulatory Visit: Payer: 59

## 2021-10-14 NOTE — Telephone Encounter (Signed)
Thank you :)

## 2021-10-21 ENCOUNTER — Ambulatory Visit: Payer: 59 | Attending: Radiation Oncology

## 2021-10-21 DIAGNOSIS — C50411 Malignant neoplasm of upper-outer quadrant of right female breast: Secondary | ICD-10-CM | POA: Diagnosis present

## 2021-10-21 DIAGNOSIS — Z483 Aftercare following surgery for neoplasm: Secondary | ICD-10-CM | POA: Diagnosis present

## 2021-10-21 DIAGNOSIS — I890 Lymphedema, not elsewhere classified: Secondary | ICD-10-CM | POA: Insufficient documentation

## 2021-10-21 DIAGNOSIS — R293 Abnormal posture: Secondary | ICD-10-CM | POA: Diagnosis present

## 2021-10-21 DIAGNOSIS — Z170 Estrogen receptor positive status [ER+]: Secondary | ICD-10-CM | POA: Insufficient documentation
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
## 2021-11-10 ENCOUNTER — Ambulatory Visit (INDEPENDENT_AMBULATORY_CARE_PROVIDER_SITE_OTHER): Payer: 59 | Admitting: Family Medicine

## 2021-11-10 ENCOUNTER — Encounter: Payer: Self-pay | Admitting: Family Medicine

## 2021-11-10 VITALS — BP 142/82 | HR 94 | Ht 63.0 in | Wt 241.0 lb

## 2021-11-10 DIAGNOSIS — R29898 Other symptoms and signs involving the musculoskeletal system: Secondary | ICD-10-CM | POA: Insufficient documentation

## 2021-11-10 DIAGNOSIS — I10 Essential (primary) hypertension: Secondary | ICD-10-CM | POA: Diagnosis not present

## 2021-11-10 DIAGNOSIS — E039 Hypothyroidism, unspecified: Secondary | ICD-10-CM | POA: Diagnosis not present

## 2021-11-10 DIAGNOSIS — N1832 Chronic kidney disease, stage 3b: Secondary | ICD-10-CM | POA: Diagnosis not present

## 2021-11-10 HISTORY — DX: Essential (primary) hypertension: I10

## 2021-11-10 MED ORDER — LOSARTAN POTASSIUM 25 MG PO TABS: 25.0000 mg | ORAL_TABLET | Freq: Every day | ORAL | 0 refills | Status: DC

## 2021-11-10 NOTE — Assessment & Plan Note (Signed)
Am concerned about the progressing weakness in her lower extremities.  We discussed just doing some blood work to rule out some underlying deficiencies or causes.  I really like for her to make an appointment with her neurologist she has some upcoming memory testing but I discussed with her that this would need to be a separate appointment for him to address her leg weakness.  She is possibly having some nerve blocks done in her knees which might be helpful but I still am concerned that this may be a separate issue.

## 2021-11-10 NOTE — Progress Notes (Signed)
Established Patient Office Visit  Subjective   Patient ID: Margaret Beasley, female    DOB: 05/21/1955  Age: 66 y.o. MRN: [id number]  Chief Complaint  Patient presents with   Hypertension    HPI  Hypertension- Pt denies chest pain, SOB, dizziness, or heart palpitations.  Taking meds as directed w/o problems.  Denies medication side effects.   Brought in copy of home BPS.  She is concerned because her blood pressures have been for about the last 3 weeks she has been seeing numbers in the 130s and 140s which is not like her at all she usually has great blood pressure.  She has been in a lot of pain.  More recently she has been having some increased weakness particularly in her lower extremities.  She said that 1 day she woke up with pins and needle sensation in the bottoms of both of her feet and then the next day was walking and felt like her ankles wanted to give out.  She is also had a little bit of swelling in her feet more on the right.  Then she felt like it gradually started to affect her knees she started having lateral knee pain as well as weakness and now feels like she is getting some weakness in her thighs.  She is very concerned because she says she remembers her grandmother suddenly not being able to walk and was in a wheelchair at the rest of her life.  She has an appointment coming up with Dr. Lynnette Caffey her neurologist but it is for memory testing and evaluation its not a routine follow-up.  He recently went to see the nephrologist for CKD 3.  They felt like her levels had stabilized and so released her.  Last kidney function was 1.1.     ROS    Objective:     BP (!) 142/82   Pulse 94   Ht '5\' 3"'$  (1.6 m)   Wt 241 lb (109.3 kg)   SpO2 98%   BMI 42.69 kg/m    Physical Exam Vitals and nursing note reviewed.  Constitutional:      Appearance: She is well-developed.  HENT:     Head: Normocephalic and atraumatic.  Cardiovascular:     Rate and Rhythm: Normal rate and  regular rhythm.     Heart sounds: Normal heart sounds.  Pulmonary:     Effort: Pulmonary effort is normal.     Breath sounds: Normal breath sounds.  Skin:    General: Skin is warm and dry.  Neurological:     Mental Status: She is alert and oriented to person, place, and time.  Psychiatric:        Behavior: Behavior normal.      No results found for any visits on 11/10/21.    The 10-year ASCVD risk score (Arnett DK, et al., 2019) is: 8%    Assessment & Plan:   Problem List Items Addressed This Visit       Cardiovascular and Mediastinum   Essential hypertension    Pressure elevated here today as well as home blood pressure log showing elevated pressures though she did have a couple that were at goal.  Pain could certainly be contributing and that is a possibility she does have an appointment to follow-up for a nerve block on her knees.  We discussed starting losartan which could also have some renal protection benefit.  We will need to recheck her BMP in a few weeks after starting the  medication.      Relevant Medications   losartan (COZAAR) 25 MG tablet     Endocrine   Hypothyroid - Primary   Relevant Orders   B12   Vitamin B1   Vitamin B6   BASIC METABOLIC PANEL WITH GFR   TSH     Genitourinary   Chronic kidney disease (CKD) stage G3b/A1, moderately decreased glomerular filtration rate (GFR) between 30-44 mL/min/1.73 square meter and albuminuria creatinine ratio less than 30 mg/g [facility])    Did discuss that she is actually had CKD since at least 2015 so for at least the last 8 years maybe even a little longer.  The nephrologist felt reassured.  Because her blood pressures have been elevated over the last several weeks I think it would be great to start losartan which could have renal protective benefits.        Other   Weakness of both lower extremities    Am concerned about the progressing weakness in her lower extremities.  We discussed just doing some blood work  to rule out some underlying deficiencies or causes.  I really like for her to make an appointment with her neurologist she has some upcoming memory testing but I discussed with her that this would need to be a separate appointment for him to address her leg weakness.  She is possibly having some nerve blocks done in her knees which might be helpful but I still am concerned that this may be a separate issue.      Relevant Orders   B12   Vitamin B1   Vitamin B6   BASIC METABOLIC PANEL WITH GFR   TSH   Ambulatory referral to Neurology    Return in about 4 weeks (around 12/08/2021) for Hypertension.    Beatrice Lecher, MD

## 2021-11-10 NOTE — Assessment & Plan Note (Signed)
Did discuss that she is actually had CKD since at least 2015 so for at least the last 8 years maybe even a little longer.  The nephrologist felt reassured.  Because her blood pressures have been elevated over the last several weeks I think it would be great to start losartan which could have renal protective benefits.

## 2021-11-10 NOTE — Assessment & Plan Note (Signed)
Pressure elevated here today as well as home blood pressure log showing elevated pressures though she did have a couple that were at goal.  Pain could certainly be contributing and that is a possibility she does have an appointment to follow-up for a nerve block on her knees.  We discussed starting losartan which could also have some renal protection benefit.  We will need to recheck her BMP in a few weeks after starting the medication.

## 2021-11-11 NOTE — Progress Notes (Signed)
Marcele, vitamin B12 is normal but it is slowly drifting downward.  It may be reasonable to go ahead and start an over-the-counter B12 supplement at this point just so that we get ahead of it.  Your metabolic panel looks good.  Thyroid does look better its moved back into the normal range which is great.  We will plan to recheck that again in about 6 months.  Vitamin B1 and vitamin B6 are pending.

## 2021-11-15 LAB — BASIC METABOLIC PANEL WITH GFR
BUN: 14 mg/dL (ref 7–25)
CO2: 29 mmol/L (ref 20–32)
Calcium: 9.3 mg/dL (ref 8.6–10.4)
Chloride: 105 mmol/L (ref 98–110)
Creat: 1.02 mg/dL (ref 0.50–1.05)
Glucose, Bld: 99 mg/dL (ref 65–99)
Potassium: 5.2 mmol/L (ref 3.5–5.3)
Sodium: 141 mmol/L (ref 135–146)
eGFR: 61 mL/min/{1.73_m2} (ref >=60)

## 2021-11-15 LAB — VITAMIN B6: Vitamin B6: 6.5 ng/mL (ref 2.1–21.7)

## 2021-11-15 LAB — VITAMIN B12: Vitamin B-12: 298 pg/mL (ref 200–1100)

## 2021-11-15 LAB — TSH: TSH: 3.73 mIU/L (ref 0.40–4.50)

## 2021-11-15 LAB — VITAMIN B1: Vitamin B1 (Thiamine): 10 nmol/L (ref 8–30)

## 2021-11-16 NOTE — Progress Notes (Signed)
B6 is okay.  Vitamin B1 is normal but on the low end.  I would recommend taking a vitamin B1 supplement also known as thiamine.  And always recheck your level in 3 months.

## 2021-11-26 ENCOUNTER — Telehealth (INDEPENDENT_AMBULATORY_CARE_PROVIDER_SITE_OTHER): Payer: 59 | Admitting: Family Medicine

## 2021-11-26 DIAGNOSIS — G43109 Migraine with aura, not intractable, without status migrainosus: Secondary | ICD-10-CM | POA: Diagnosis not present

## 2021-11-26 DIAGNOSIS — J069 Acute upper respiratory infection, unspecified: Secondary | ICD-10-CM | POA: Diagnosis not present

## 2021-11-26 DIAGNOSIS — R0981 Nasal congestion: Secondary | ICD-10-CM | POA: Diagnosis not present

## 2021-11-26 DIAGNOSIS — U071 COVID-19: Secondary | ICD-10-CM

## 2021-11-26 DIAGNOSIS — R8290 Unspecified abnormal findings in urine: Secondary | ICD-10-CM

## 2021-11-26 LAB — POCT URINALYSIS DIP (CLINITEK)
Blood, UA: NEGATIVE
Glucose, UA: NEGATIVE mg/dL
Leukocytes, UA: NEGATIVE
Nitrite, UA: NEGATIVE
POC PROTEIN,UA: 30 — AB
Spec Grav, UA: 1.02 (ref 1.010–1.025)
Urobilinogen, UA: 0.2 E.U./dL
pH, UA: 5.5 (ref 5.0–8.0)

## 2021-11-26 LAB — POCT INFLUENZA A/B
Influenza A, POC: NEGATIVE
Influenza B, POC: NEGATIVE

## 2021-11-26 LAB — POC COVID19 BINAXNOW: SARS Coronavirus 2 Ag: POSITIVE — AB

## 2021-11-26 MED ORDER — PROMETHAZINE HCL 25 MG RE SUPP: 25.0000 mg | Freq: Four times a day (QID) | RECTAL | 2 refills | Status: DC | PRN

## 2021-11-26 MED ORDER — NIRMATRELVIR/RITONAVIR (PAXLOVID) TABLET (RENAL DOSING): 2.0000 | ORAL_TABLET | Freq: Two times a day (BID) | ORAL | 0 refills | Status: AC

## 2021-11-26 NOTE — Progress Notes (Signed)
Virtual Visit via Video Note  I connected with Margaret Beasley on 11/26/21 at 10:10 AM EDT by a video enabled telemedicine application and verified that I am speaking with the correct person using two identifiers.   I discussed the limitations of evaluation and management by telemedicine and the availability of in person appointments. The patient expressed understanding and agreed to proceed.  After doing the initial visit it was converted to an in person visit and she was able to come to the office same days for further evaluation. Patient location: at home Provider location: in office  Subjective:    CC:   Chief Complaint  Patient presents with   Migraine   Nasal Congestion    HPI:  Pt c/o  she feels she may have the flu - she reports yestedray  started with Migraine - -she took '200mg'$  immetrix  and phenegen suppository  but migraine persisted ( only helped about 65% to 75%- ) she took excederin migraine and this relieved the migraine completely. She states she is having nausea, nasal congestion, loss of appetite, and fatigue (just laying in bed ) - she denies fever, or watery eyes. She is requesting a rx rf of phenergan suppositories today. No runny nose. Scratchy throat.   She says the HA was so bad that she actually called EMS yesterday and waited over an hour for them to come and they actually never came.  She finally took an Excedrin Migraine and got some relief.   Past medical history, Surgical history, Family history not pertinant except as noted below, Social history, Allergies, and medications have been entered into the medical record, reviewed, and corrections made.    Objective:    General: Speaking clearly in complete sentences without any shortness of breath.  Alert and oriented x3.  Normal judgment. No apparent acute distress.    Impression and Recommendations:    Problem List Items Addressed This Visit       Cardiovascular and Mediastinum   Migraine    Relevant Medications   promethazine (PHENERGAN) 25 MG suppository   Other Visit Diagnoses     Viral upper respiratory tract infection    -  Primary   Relevant Medications   nirmatrelvir/ritonavir EUA, renal dosing, (PAXLOVID) 10 x 150 MG & 10 x '100MG'$  TABS   Other Relevant Orders   POCT Influenza A/B (Completed)   POC COVID-19 (Completed)   Complaint of nasal congestion       Relevant Orders   POCT Influenza A/B (Completed)   POC COVID-19 (Completed)   Abnormal urine finding       Relevant Orders   POCT URINALYSIS DIP (CLINITEK) (Completed)   COVID-19       Relevant Medications   nirmatrelvir/ritonavir EUA, renal dosing, (PAXLOVID) 10 x 150 MG & 10 x '100MG'$  TABS       COVID-19-swab positive for COVID discussed option of treating with Paxlovid.  Prescription sent to pharmacy.  She had also noticed a little bit of mucus in her urine today so we also did a dipstick for that.  Need to quarantine for 5 days.  She says her husband has been sick as well.  Encouraged her to have him tested if they are positive then they do not have to quarantine from each other.  Migraine headache-she had a pretty severe migraine yesterday to the point of calling EMS she is feeling better today it is possible she could have another 1 especially during her active infection with  COVID.  Encouraged her to use her Excedrin if needed again.  Orders Placed This Encounter  Procedures   POCT Influenza A/B   POC COVID-19    Order Specific Question:   Previously tested for COVID-19    Answer:   Yes    Order Specific Question:   Resident in a congregate (group) care setting    Answer:   No    Order Specific Question:   Employed in healthcare setting    Answer:   No    Order Specific Question:   Pregnant    Answer:   No   POCT URINALYSIS DIP (CLINITEK)    Meds ordered this encounter  Medications   promethazine (PHENERGAN) 25 MG suppository    Sig: Place 1 suppository (25 mg total) rectally every 6 (six)  hours as needed for nausea or vomiting.    Dispense:  12 each    Refill:  2   nirmatrelvir/ritonavir EUA, renal dosing, (PAXLOVID) 10 x 150 MG & 10 x '100MG'$  TABS    Sig: Take 2 tablets by mouth 2 (two) times daily for 5 days. (Take nirmatrelvir 150 mg one tablet twice daily for 5 days and ritonavir 100 mg one tablet twice daily for 5 days) Patient GFR is 56    Dispense:  20 tablet    Refill:  0     I discussed the assessment and treatment plan with the patient. The patient was provided an opportunity to ask questions and all were answered. The patient agreed with the plan and demonstrated an understanding of the instructions.   The patient was advised to call back or seek an in-person evaluation if the symptoms worsen or if the condition fails to improve as anticipated.   Beatrice Lecher, MD

## 2021-11-26 NOTE — Progress Notes (Signed)
HI Margaret Beasley,  That urinalysis showed some ketones that usually from fasting.  And just a little bit of protein.  So I want to recheck that in a couple of weeks when you are feeling much better.  But nothing else worrisome and no sign of infection.

## 2021-11-26 NOTE — Progress Notes (Signed)
Pt c/o  she feels she may have the flu - ashe reports yestedray  started with Migraine - -she took '200mg'$  immetrix  and phenegen suppository  but migraine persisted ( only helped about 65% to 75%- )she took excederin migraine and this relieved the migraine completely. She states she is having  nausea,nasal congestion  loss of appetite , nausea and fatigue ( just laying in bed ) - she denies  fever, or watery eyes. She is requesting a rx rf of  Phenergen suppositories today

## 2021-12-08 NOTE — Progress Notes (Unsigned)
   Established Patient Office Visit  Subjective   Patient ID: Margaret Beasley, female    DOB: 09/18/1955  Age: 66 y.o. MRN: [id number]  No chief complaint on file.   HPI  F/U new start losartan for Hypertension.   {History (Optional):23778}  ROS    Objective:     There were no vitals taken for this visit. {Vitals History (Optional):23777}  Physical Exam Vitals and nursing note reviewed.  Constitutional:      Appearance: She is well-developed.  HENT:     Head: Normocephalic and atraumatic.  Cardiovascular:     Rate and Rhythm: Normal rate and regular rhythm.     Heart sounds: Normal heart sounds.  Pulmonary:     Effort: Pulmonary effort is normal.     Breath sounds: Normal breath sounds.  Skin:    General: Skin is warm and dry.  Neurological:     Mental Status: She is alert and oriented to person, place, and time.  Psychiatric:        Behavior: Behavior normal.      No results found for any visits on 12/09/21.  {Labs (Optional):23779}  The 10-year ASCVD risk score (Arnett DK, et al., 2019) is: 8%    Assessment & Plan:   Problem List Items Addressed This Visit       Cardiovascular and Mediastinum   Essential hypertension - Primary    No follow-ups on file.    Beatrice Lecher, MD

## 2021-12-09 ENCOUNTER — Ambulatory Visit (INDEPENDENT_AMBULATORY_CARE_PROVIDER_SITE_OTHER): Payer: 59

## 2021-12-09 ENCOUNTER — Ambulatory Visit (INDEPENDENT_AMBULATORY_CARE_PROVIDER_SITE_OTHER): Payer: 59 | Admitting: Family Medicine

## 2021-12-09 ENCOUNTER — Encounter: Payer: Self-pay | Admitting: Family Medicine

## 2021-12-09 VITALS — BP 139/79 | HR 76 | Ht 63.0 in | Wt 235.0 lb

## 2021-12-09 DIAGNOSIS — R8290 Unspecified abnormal findings in urine: Secondary | ICD-10-CM | POA: Diagnosis not present

## 2021-12-09 DIAGNOSIS — I10 Essential (primary) hypertension: Secondary | ICD-10-CM | POA: Diagnosis not present

## 2021-12-09 DIAGNOSIS — R051 Acute cough: Secondary | ICD-10-CM

## 2021-12-09 DIAGNOSIS — N069 Isolated proteinuria with unspecified morphologic lesion: Secondary | ICD-10-CM

## 2021-12-09 DIAGNOSIS — R634 Abnormal weight loss: Secondary | ICD-10-CM

## 2021-12-09 DIAGNOSIS — Z23 Encounter for immunization: Secondary | ICD-10-CM | POA: Diagnosis not present

## 2021-12-09 DIAGNOSIS — R110 Nausea: Secondary | ICD-10-CM

## 2021-12-09 DIAGNOSIS — R42 Dizziness and giddiness: Secondary | ICD-10-CM | POA: Diagnosis not present

## 2021-12-09 DIAGNOSIS — R2231 Localized swelling, mass and lump, right upper limb: Secondary | ICD-10-CM | POA: Diagnosis not present

## 2021-12-09 LAB — POCT URINALYSIS DIP (CLINITEK)
Bilirubin, UA: NEGATIVE
Blood, UA: NEGATIVE
Glucose, UA: NEGATIVE mg/dL
Ketones, POC UA: NEGATIVE mg/dL
Leukocytes, UA: NEGATIVE
Nitrite, UA: NEGATIVE
POC PROTEIN,UA: NEGATIVE
Spec Grav, UA: 1.01 (ref 1.010–1.025)
Urobilinogen, UA: 0.2 E.U./dL
pH, UA: 7 (ref 5.0–8.0)

## 2021-12-09 MED ORDER — LOSARTAN POTASSIUM 50 MG PO TABS: 50.0000 mg | ORAL_TABLET | Freq: Every day | ORAL | 0 refills | Status: DC

## 2021-12-09 NOTE — Assessment & Plan Note (Addendum)
Brought in home blood pressure logs.  About half of them are greater than 130 and the other half are in the 120s.  Repeat blood pressure here today in the upper 130s.  Increase losartan to 50 mg to see if that will lower blood pressure by an additional 5-8 points.  Follow-up in 3 to 4 weeks with nurse visit for blood pressure.

## 2021-12-10 ENCOUNTER — Other Ambulatory Visit: Payer: Self-pay | Admitting: *Deleted

## 2021-12-10 DIAGNOSIS — I10 Essential (primary) hypertension: Secondary | ICD-10-CM

## 2021-12-10 LAB — COMPLETE METABOLIC PANEL WITH GFR
AG Ratio: 1.8 (calc) (ref 1.0–2.5)
ALT: 19 U/L (ref 6–29)
AST: 18 U/L (ref 10–35)
Albumin: 4 g/dL (ref 3.6–5.1)
Alkaline phosphatase (APISO): 63 U/L (ref 37–153)
BUN: 7 mg/dL (ref 7–25)
CO2: 30 mmol/L (ref 20–32)
Calcium: 8.8 mg/dL (ref 8.6–10.4)
Chloride: 105 mmol/L (ref 98–110)
Creat: 0.93 mg/dL (ref 0.50–1.05)
Globulin: 2.2 g/dL (calc) (ref 1.9–3.7)
Glucose, Bld: 95 mg/dL (ref 65–99)
Potassium: 5 mmol/L (ref 3.5–5.3)
Sodium: 139 mmol/L (ref 135–146)
Total Bilirubin: 0.4 mg/dL (ref 0.2–1.2)
Total Protein: 6.2 g/dL (ref 6.1–8.1)
eGFR: 68 mL/min/{1.73_m2} (ref >=60)

## 2021-12-10 LAB — LIPASE: Lipase: 26 U/L (ref 7–60)

## 2021-12-11 NOTE — Progress Notes (Signed)
HI Margaret Beasley, great news!  The lump that you noted is just a lipoma which is a little fat growth.  These are not harmful and you can have them scattered pretty much throughout the body.  They can really show up just about anywhere.  Sometimes they will get a little larger over time but sometimes will stay about the same size.  If at any point you feel like it is bothersome we can opt to have it removed but its not necessary as these are benign lesions.

## 2021-12-11 NOTE — Progress Notes (Signed)
Hayes, no sign of pancreatitis.  Kidney function looks great at 0.9.  Electrolytes are normal.  Liver function looks good.  I have not seen the chest x-ray results back yet but they hopefully will be back later today as well as the ultrasound result.

## 2021-12-14 MED ORDER — PREDNISONE 20 MG PO TABS: 40.0000 mg | ORAL_TABLET | Freq: Every day | ORAL | 0 refills | Status: DC

## 2021-12-14 NOTE — Progress Notes (Signed)
Hi Margaret Beasley, your chest xray is clear so no sign of infection. I would like to try you on prednisone for you cough and see if that helps. Cough can also come fro past nasal drip and reflux. You can also try using nasal saline irrigation nightly.   Orders Placed This Encounter     predniSONE (DELTASONE) 20 MG tablet         Sig: Take 2 tablets (40 mg total) by mouth daily with breakfast.         Dispense:  10 tablet         Refill:  0

## 2021-12-28 ENCOUNTER — Ambulatory Visit: Payer: 59 | Attending: Radiation Oncology

## 2021-12-28 VITALS — Wt 237.4 lb

## 2021-12-28 DIAGNOSIS — Z483 Aftercare following surgery for neoplasm: Secondary | ICD-10-CM | POA: Insufficient documentation
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
## 2022-01-12 ENCOUNTER — Encounter: Payer: Self-pay | Admitting: Family Medicine

## 2022-01-12 ENCOUNTER — Ambulatory Visit (INDEPENDENT_AMBULATORY_CARE_PROVIDER_SITE_OTHER): Payer: 59 | Admitting: Family Medicine

## 2022-01-12 VITALS — BP 129/70 | HR 80 | Ht 63.0 in | Wt 235.0 lb

## 2022-01-12 DIAGNOSIS — M47816 Spondylosis without myelopathy or radiculopathy, lumbar region: Secondary | ICD-10-CM

## 2022-01-12 DIAGNOSIS — N182 Chronic kidney disease, stage 2 (mild): Secondary | ICD-10-CM | POA: Diagnosis not present

## 2022-01-12 DIAGNOSIS — I10 Essential (primary) hypertension: Secondary | ICD-10-CM

## 2022-01-12 MED ORDER — LIDOCAINE 4 % EX PTCH: 1.0000 | MEDICATED_PATCH | CUTANEOUS | 1 refills | Status: DC

## 2022-01-12 NOTE — Progress Notes (Signed)
   Established Patient Office Visit  Subjective   Patient ID: Margaret Beasley, female    DOB: 03-01-1955  Age: 66 y.o. MRN: [id number]  Chief Complaint  Patient presents with   Follow-up    Nausea and dizziness     HPI  Follow-up hypertension-we increased her losartan the last time she was here 4 weeks ago.  Blood pressures were almost at goal.  She did bring in her home blood pressure cuff today so she can compare to our machine.  It is reading about 15 points higher.  Home blood pressures have ranged from a systolic of 749 up to the low 150s.  But most of them were in the 120s and 130s.  F/U  intermittent nausea for 1 year. -Labs indicated no sign of liver or information or pancreatitis.  She is having more frequent low back pain she is mostly relying on Tylenol it does help some.  She has been using Voltaren gel for her knees and other joints but read on the package not to use it for her back.     ROS    Objective:     BP 129/70   Pulse 80   Ht '5\' 3"'$  (1.6 m)   Wt 235 lb (106.6 kg)   SpO2 98%   BMI 41.63 kg/m    Physical Exam Vitals and nursing note reviewed.  Constitutional:      Appearance: She is well-developed.  HENT:     Head: Normocephalic and atraumatic.  Cardiovascular:     Rate and Rhythm: Normal rate and regular rhythm.     Heart sounds: Normal heart sounds.  Pulmonary:     Effort: Pulmonary effort is normal.     Breath sounds: Normal breath sounds.  Skin:    General: Skin is warm and dry.  Neurological:     Mental Status: She is alert and oriented to person, place, and time.  Psychiatric:        Behavior: Behavior normal.      No results found for any visits on 01/12/22.    The 10-year ASCVD risk score (Arnett DK, et al., 2019) is: 6.6%    Assessment & Plan:   Problem List Items Addressed This Visit       Cardiovascular and Mediastinum   Essential hypertension - Primary    Blood pressure here looks fantastic it looks like her home  cuff is reading about 15 points higher.  Were going to continue with current regimen since it looks great.        Musculoskeletal and Integument   Spondylosis without myelopathy or radiculopathy, lumbar region     Recommend a trial of lidocaine patches we will send a new prescription to pharmacy for her to try.      Relevant Medications   lidocaine 4 %     Genitourinary   CKD (chronic kidney disease) stage 2, GFR 60-89 ml/min    I did readjust diagnosis on her problem list since her renal function is looking much better and GFR is now just above 60.      Encouraged her to schedule Medicare wellness exam with Novella Rob our Medicare wellness nurse.  Return in about 3 months (around 04/14/2022) for Hypertension.    Beatrice Lecher, MD

## 2022-01-12 NOTE — Assessment & Plan Note (Signed)
Recommend a trial of lidocaine patches we will send a new prescription to pharmacy for her to try.

## 2022-01-12 NOTE — Progress Notes (Signed)
Pt brought in her home BP cuff : 133/82 P: 86 Second home reading 145/90 p:85  She wanted to know if there was something that she can take for backache and headaches. She said that she has back pain on the R side that is from a herniated disc.

## 2022-01-12 NOTE — Assessment & Plan Note (Signed)
I did readjust diagnosis on her problem list since her renal function is looking much better and GFR is now just above 60.

## 2022-01-12 NOTE — Assessment & Plan Note (Signed)
Blood pressure here looks fantastic it looks like her home cuff is reading about 15 points higher.  Were going to continue with current regimen since it looks great.

## 2022-02-05 ENCOUNTER — Other Ambulatory Visit: Payer: Self-pay | Admitting: Family Medicine

## 2022-02-05 DIAGNOSIS — I10 Essential (primary) hypertension: Secondary | ICD-10-CM

## 2022-02-07 ENCOUNTER — Other Ambulatory Visit: Payer: Self-pay | Admitting: Family Medicine

## 2022-02-15 DIAGNOSIS — C50919 Malignant neoplasm of unspecified site of unspecified female breast: Secondary | ICD-10-CM

## 2022-02-15 HISTORY — DX: Malignant neoplasm of unspecified site of unspecified female breast: C50.919

## 2022-03-02 ENCOUNTER — Telehealth: Payer: Self-pay

## 2022-03-02 ENCOUNTER — Other Ambulatory Visit: Payer: Self-pay | Admitting: Radiation Oncology

## 2022-03-02 DIAGNOSIS — Z853 Personal history of malignant neoplasm of breast: Secondary | ICD-10-CM

## 2022-03-02 NOTE — Telephone Encounter (Signed)
Patient called concerned about 2 new bumps on her right nipple and redness around areola. She called [facility] to see if her annual mammogram could be moved up to investigate, but was told a physician would need to recommend the sooner appointment before they could change appointment (she currently scheduled for annual mammogram in March). Patient unable to come into office today for Dr. Sondra Come to examine site, but agreed to F/U appointment this Thursday 03/04/22. Denied any other needs at this time, but verbalized that she will call if something arises before her Thursday appointment

## 2022-03-03 NOTE — Progress Notes (Addendum)
Radiation Oncology         [phone] ________________________________  Name: Margaret Beasley MRN: [id number]  Date: 03/04/2022  DOB: [birth year]  Follow-Up Visit Note  CC: [name], MD  Nicholas Lose, MD    ICD-10-CM   1. Malignant neoplasm of upper-outer quadrant of right breast in female, estrogen receptor positive ([place])  C50.411 CBC with Differential ([place])   Z17.0 [place] ([place] only)      Diagnosis: Stage IA, (cT1a, cN0, cM0) Right Breast UOQ, Invasive Ductal Carcinoma with DCIS, ER+ / PR+ / Her2-, Grade 1-2   Interval Since Last Radiation:  1 year, 5 months, and 8 days   Intent: Curative   Radiation Treatment Dates: 08/26/2020 through 09/24/2020 Site Technique Total Dose (Gy) Dose per Fx (Gy) Completed Fx Beam Energies  Breast, Right: Breast_Rt 3D 40.05/40.05 2.67 15/15 10X, 15X  Breast, Right: Breast_Rt_Bst 3D 12/12 2 6/6 6X, 15X   Narrative:  The patient returns today for routine follow-up. She was last seen here for follow-up on 09/21/21. No significant oncologic interval history since the patient was last seen.  Imaging performed in the interval since her last visit includes:  -- Renal ultrasound on 10/22/21 (for follow-up of stage 3 CKD) which showed no renal abnormalities or evidence of obstruction.  -- Right upper extremity ultrasound on 12/09/21 demonstrated a lipoma within the superficial subcutaneous soft tissues of the right upper extremity measuring 1.8 cm.    -- Chest x-ray on 12/09/21 for evaluation of cough showed no active cardiopulmonary disease (management of cough included prednisone prescribed by her PCP).   Patient endorses axillary pain, along with some skin changes to her breast, and some breast swelling. She has also been experiencing weight loss, nausea, and loss of appetite for the past month. She also states she has experienced black tarry stools.                        Allergies:  is allergic to opium, topiramate,  benadryl [diphenhydramine hcl], butorphanol, celecoxib, latex, penicillins, arimidex [anastrozole], gabapentin, oxycodone-acetaminophen, oxycodone-acetaminophen, requip [ropinirole], stadol [butorphanol tartrate], topamax, and tramadol.  Meds: Current Outpatient Medications  Medication Sig Dispense Refill   acetaminophen (TYLENOL) 650 MG CR tablet Take by mouth.     DULoxetine (CYMBALTA) 30 MG capsule TAKE 3 CAPSULES BY MOUTH EVERY DAY 500 capsule 1   folic acid (FOLVITE) 1 MG tablet Take 1 tablet by mouth daily.     HYDROcodone-acetaminophen (NORCO/VICODIN) 5-325 MG tablet Take 1 tablet by mouth every 6 (six) hours as needed for moderate pain. 15 tablet 0   lamoTRIgine (LAMICTAL) 100 MG tablet Take 100 mg by mouth every evening.     levETIRAcetam (KEPPRA XR) 500 MG 24 hr tablet 4 (four) times daily.      levothyroxine (SYNTHROID) 50 MCG tablet Take 1 tablet (50 mcg total) by mouth daily before breakfast. 90 tablet 1   losartan (COZAAR) 50 MG tablet Take 1 tablet (50 mg total) by mouth daily. 90 tablet 0   methotrexate (RHEUMATREX) 2.5 MG tablet Take by mouth. 4 tablets a week.     promethazine (PHENERGAN) 25 MG suppository Place 1 suppository (25 mg total) rectally every 6 (six) hours as needed for nausea or vomiting. 12 each 2   simvastatin (ZOCOR) 40 MG tablet TAKE 1 TABLET BY MOUTH EVERYDAY AT BEDTIME 90 tablet 3   SUMAtriptan (IMITREX) 100 MG tablet TAKE 1 TABLET (100 MG TOTAL) BY MOUTH EVERY 2 (  TWO) HOURS AS NEEDED FOR MIGRAINE. 30 tablet 3   clobetasol (TEMOVATE) 0.05 % external solution Apply topically daily. (Patient not taking: Reported on 03/04/2022)     lidocaine 4 % Place 1 patch onto the skin daily. On low back (Patient not taking: Reported on 03/04/2022) 30 patch 1   No current facility-administered medications for this encounter.    Physical Findings: The patient is in no acute distress. Patient is alert and oriented.  height is '5\' 3"'$  (1.6 m) and weight is 235 lb 8 oz (106.8  kg). Her temporal temperature is 96.7 F (35.9 C) (abnormal). Her blood pressure is 129/73 and her pulse is 85. Her respiration is 18 and oxygen saturation is 97%. .  Lungs are clear to auscultation bilaterally. Heart has regular rate and rhythm. No palpable cervical, supraclavicular, or axillary adenopathy. Abdomen soft, non-tender, normal bowel sounds.  Left Breast: no palpable mass, nipple discharge or bleeding. Right Breast: Edema noted in the nipple areolar complex area, consistent with last physical exam. Slight erythema visualized within the central breast which has also been noted on previous changes. I am unable to visualize any abnormal skin findings around the nipple. No dominant mass palpated in the breast, no nipple discharge or bleeding. No abnormality palpated in the right axillary area.  Lab Findings: Lab Results  Component Value Date   WBC 4.6 03/04/2022   HGB 15.1 (H) 03/04/2022   HCT 45.7 03/04/2022   MCV 99.1 03/04/2022   PLT 227 03/04/2022    Radiographic Findings: No results found.  Impression:  Stage IA, (cT1a, cN0, cM0) Right Breast UOQ, Invasive Ductal Carcinoma with DCIS, ER+ / PR+ / Her2-, Grade 1-2   The patient is recovering well from the effects of radiation. Physical exam did not show any concerning findings for disease recurrence. Due to concerns of the patient, I did offer to schedule her for a diagnostic mammogram. Patient is scheduled for her annual screening mammogram in March, so she declined.   She has seen physical therapy for breast lymphedema.  She does have exercises concerning this issue but has not been performing these lately.  In light of her breast lymphedema recommend she restart these exercises.  Patient endorsed tarry stools and weight loss/nausea which could be indicative of a GI bleed. CBC with diff ordered to help evaluate for blood loss.  She does have the Cologuard study at home and encouraged her to go ahead and proceed with completing  as recommended and forwarding for evaluation.  Encouraged her to follow-up with her primary care physician concerning her GI complaints.  Plan:  CBC with diff/CMET ordered today. Follow up with routine mammogram screening in March. Patient knows to call back if symptoms worsen. She can call the office with any further questions or concerns she has.  Routine follow-up in 6 months   40 minutes of total time was spent for this patient encounter, including preparation, face-to-face counseling with the patient and coordination of care, physical exam, and documentation of the encounter. ____________________________________   Leona Singleton, PA   Blair Promise, PhD, MD  This document serves as a record of services personally performed by Gery Pray, MD. It was created on his behalf by Roney Mans, a trained medical scribe. The creation of this record is based on the scribe's personal observations and the provider's statements to them. This document has been checked and approved by the attending provider.

## 2022-03-04 ENCOUNTER — Ambulatory Visit
Admission: RE | Admit: 2022-03-04 | Discharge: 2022-03-04 | Disposition: A | Discharge status: Home / Self Care | Payer: 59 | Source: Ambulatory Visit | Attending: Radiation Oncology | Admitting: Radiation Oncology

## 2022-03-04 ENCOUNTER — Encounter: Payer: Self-pay | Admitting: Radiation Oncology

## 2022-03-04 VITALS — BP 129/73 | HR 85 | Temp 96.7°F | Resp 18 | Ht 63.0 in | Wt 235.5 lb

## 2022-03-04 DIAGNOSIS — Z170 Estrogen receptor positive status [ER+]: Secondary | ICD-10-CM | POA: Diagnosis not present

## 2022-03-04 DIAGNOSIS — C50411 Malignant neoplasm of upper-outer quadrant of right female breast: Secondary | ICD-10-CM | POA: Insufficient documentation

## 2022-03-04 DIAGNOSIS — I890 Lymphedema, not elsewhere classified: Secondary | ICD-10-CM | POA: Diagnosis not present

## 2022-03-04 DIAGNOSIS — Z79890 Hormone replacement therapy (postmenopausal): Secondary | ICD-10-CM | POA: Insufficient documentation

## 2022-03-04 DIAGNOSIS — Z923 Personal history of irradiation: Secondary | ICD-10-CM | POA: Insufficient documentation

## 2022-03-04 DIAGNOSIS — Z79899 Other long term (current) drug therapy: Secondary | ICD-10-CM | POA: Diagnosis not present

## 2022-03-04 HISTORY — DX: Acute kidney failure, unspecified: N17.9

## 2022-03-04 LAB — CBC WITH DIFFERENTIAL (CANCER CENTER ONLY)
Abs Immature Granulocytes: 0.01 10*3/uL (ref 0.00–0.07)
Basophils Absolute: 0 10*3/uL (ref 0.0–0.1)
Basophils Relative: 1 %
Eosinophils Absolute: 0.1 10*3/uL (ref 0.0–0.5)
Eosinophils Relative: 2 %
HCT: 45.7 % (ref 36.0–46.0)
Hemoglobin: 15.1 g/dL — ABNORMAL HIGH (ref 12.0–15.0)
Immature Granulocytes: 0 %
Lymphocytes Relative: 29 %
Lymphs Abs: 1.3 10*3/uL (ref 0.7–4.0)
MCH: 32.8 pg (ref 26.0–34.0)
MCHC: 33 g/dL (ref 30.0–36.0)
MCV: 99.1 fL (ref 80.0–100.0)
Monocytes Absolute: 0.5 10*3/uL (ref 0.1–1.0)
Monocytes Relative: 12 %
Neutro Abs: 2.6 10*3/uL (ref 1.7–7.7)
Neutrophils Relative %: 56 %
Platelet Count: 227 10*3/uL (ref 150–400)
RBC: 4.61 MIL/uL (ref 3.87–5.11)
RDW: 13.8 % (ref 11.5–15.5)
WBC Count: 4.6 10*3/uL (ref 4.0–10.5)
nRBC: 0 % (ref 0.0–0.2)

## 2022-03-04 LAB — CMP (CANCER CENTER ONLY)
ALT: 21 U/L (ref 0–44)
AST: 22 U/L (ref 15–41)
Albumin: 4.2 g/dL (ref 3.5–5.0)
Alkaline Phosphatase: 69 U/L (ref 38–126)
Anion gap: 6 (ref 5–15)
BUN: 10 mg/dL (ref 8–23)
CO2: 32 mmol/L (ref 22–32)
Calcium: 10 mg/dL (ref 8.9–10.3)
Chloride: 102 mmol/L (ref 98–111)
Creatinine: 1.07 mg/dL — ABNORMAL HIGH (ref 0.44–1.00)
GFR, Estimated: 57 mL/min — ABNORMAL LOW (ref >60)
Glucose, Bld: 84 mg/dL (ref 70–99)
Potassium: 4.9 mmol/L (ref 3.5–5.1)
Sodium: 140 mmol/L (ref 135–145)
Total Bilirubin: 0.5 mg/dL (ref 0.3–1.2)
Total Protein: 7 g/dL (ref 6.5–8.1)

## 2022-03-04 NOTE — Progress Notes (Addendum)
Margaret Beasley presents today for follow-up after completing radiation to her right breast  Completed radiation on 09/24/20 Pain:No Skin:  Patient states that she has two bumps that came up on her breast since radiation. States that they come  and then they disappear. ROM:  No issues Lymphedema: None MedOnc F/U: States that she seen the medical oncologist three months ago. Other issues of note: Reports moderate fatigue. States she currently takes methotrexate for arthritis. Pt reports Yes No Comments  Tamoxifen '[]'$  '[x]'$    Letrozole '[]'$  '[x]'$    Anastrazole '[]'$  '[x]'$    Mammogram '[]'$  Date:  '[x]'$      Vitals:   03/04/22 1400  BP: 129/73  Pulse: 85  Resp: 18  Temp: (!) 96.7 F (35.9 C)  TempSrc: Temporal  SpO2: 97%  Weight: 106.8 kg  Height: '5\' 3"'$  (1.6 m)

## 2022-03-06 ENCOUNTER — Other Ambulatory Visit: Payer: Self-pay | Admitting: Family Medicine

## 2022-03-06 DIAGNOSIS — I10 Essential (primary) hypertension: Secondary | ICD-10-CM

## 2022-03-23 ENCOUNTER — Other Ambulatory Visit: Payer: Self-pay | Admitting: Radiation Oncology

## 2022-03-23 ENCOUNTER — Ambulatory Visit
Admission: RE | Admit: 2022-03-23 | Discharge: 2022-03-23 | Disposition: A | Discharge status: Home / Self Care | Payer: 59 | Source: Ambulatory Visit | Attending: Radiation Oncology | Admitting: Radiation Oncology

## 2022-03-23 ENCOUNTER — Encounter: Payer: Self-pay | Admitting: Radiation Oncology

## 2022-03-23 DIAGNOSIS — N6489 Other specified disorders of breast: Secondary | ICD-10-CM

## 2022-03-23 DIAGNOSIS — Z853 Personal history of malignant neoplasm of breast: Secondary | ICD-10-CM

## 2022-03-23 DIAGNOSIS — R921 Mammographic calcification found on diagnostic imaging of breast: Secondary | ICD-10-CM

## 2022-04-02 ENCOUNTER — Ambulatory Visit
Admission: RE | Admit: 2022-04-02 | Discharge: 2022-04-02 | Disposition: A | Discharge status: Home / Self Care | Payer: 59 | Source: Ambulatory Visit | Attending: Radiation Oncology | Admitting: Radiation Oncology

## 2022-04-02 ENCOUNTER — Other Ambulatory Visit: Payer: Self-pay

## 2022-04-02 DIAGNOSIS — N6489 Other specified disorders of breast: Secondary | ICD-10-CM

## 2022-04-02 DIAGNOSIS — Z853 Personal history of malignant neoplasm of breast: Secondary | ICD-10-CM

## 2022-04-02 DIAGNOSIS — R921 Mammographic calcification found on diagnostic imaging of breast: Secondary | ICD-10-CM

## 2022-04-02 HISTORY — PX: BREAST BIOPSY: SHX20

## 2022-04-06 ENCOUNTER — Telehealth: Payer: Self-pay | Admitting: Hematology and Oncology

## 2022-04-06 NOTE — Telephone Encounter (Signed)
Scheduled appt per 2/19 staff msg. Pt is aware of appt date and time. Pt is aware to arrive 15 mins prior to appt time and to bring and updated insurance card. Pt is aware of appt location.

## 2022-04-08 NOTE — Progress Notes (Signed)
Patient Care Team: [name], MD as PCP - General (Family Medicine) Penny Pia (Inactive) as Referring Physician (Neurology) Mauro Kaufmann, RN as Oncology Nurse Navigator Rockwell Germany, RN as Oncology Nurse Navigator Erroll Luna, MD as Consulting Physician (General Surgery) Nicholas Lose, MD as Consulting Physician (Hematology and Oncology) Gery Pray, MD as Consulting Physician (Radiation Oncology)  DIAGNOSIS: No diagnosis found.  SUMMARY OF ONCOLOGIC HISTORY: Oncology History  Malignant neoplasm of upper-outer quadrant of right breast in female, estrogen receptor positive ([place])  05/21/2020 Initial Diagnosis   Screening mammogram showed a possible mass in the right breast. Diagnostic mammogram and US showed a 0.5cm mass at the 12 o'clock position in the right breast, with no right axillary abnormalities. Biopsy showed invasive and in situ ductal carcinoma, grade 1, HER-2 equivocal by IHC, negative by FISH (ratio 1.22), ER+ 95%, PR+ 90%, Ki67 10%.    05/28/2020 Cancer Staging   Staging form: Breast, AJCC 8th Edition - Clinical stage from 05/28/2020: Stage IA (cT1a, cN0, cM0, G2, ER+, PR+, HER2-) - Signed by Nicholas Lose, MD on 05/28/2020 Stage prefix: Initial diagnosis Method of lymph node assessment: Clinical Histologic grading system: 3 grade system   06/04/2020 Genetic Testing   Negative hereditary cancer genetic testing: no pathogenic variants detected in [facility] Multi-Cancer Panel.  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  The report date is June 04, 2020.   The Multi-Cancer Panel offered by [facility] includes sequencing and/or deletion duplication testing of the following 84 genes: AIP, ALK, APC, ATM, AXIN2,BAP1,  BARD1, BLM, BMPR1A, BRCA1, BRCA2, BRIP1, CASR, CDC73, CDH1, CDK4, CDKN1B, CDKN1C, CDKN2A (p14ARF), CDKN2A (p16INK4a), CEBPA, CHEK2, CTNNA1, DICER1, DIS3L2, EGFR (c.2369C>T, p.Thr790Met variant only), EPCAM  (Deletion/duplication testing only), FH, FLCN, GATA2, GPC3, GREM1 (Promoter region deletion/duplication testing only), HOXB13 (c.251G>A, p.Gly84Glu), HRAS, KIT, MAX, MEN1, MET, MITF (c.952G>A, p.Glu318Lys variant only), MLH1, MSH2, MSH3, MSH6, MUTYH, NBN, NF1, NF2, NTHL1, PALB2, PDGFRA, PHOX2B, PMS2, POLD1, POLE, POT1, PRKAR1A, PTCH1, PTEN, RAD50, RAD51C, RAD51D, RB1, RECQL4, RET, RUNX1, SDHAF2, SDHA (sequence changes only), SDHB, SDHC, SDHD, SMAD4, SMARCA4, SMARCB1, SMARCE1, STK11, SUFU, TERC, TERT, TMEM127, TP53, TSC1, TSC2, VHL, WRN and WT1.    06/26/2020 Surgery   Right lumpectomy ([facility]): IDC, grade 1, 0.2cm, with intermediate grade DCIS, and 2 right axillary lymph nodes negative for carcinoma.     CHIEF COMPLIANT:   INTERVAL HISTORY: JAIDALYN HENDLER is a  67 y.o. female with above-mentioned history of right breast cancer having undergone lumpectomy, currently undergoing radiation therapy. She reports    today for follow-up.  ALLERGIES:  is allergic to opium, topiramate, benadryl [diphenhydramine hcl], butorphanol, celecoxib, latex, penicillins, arimidex [anastrozole], gabapentin, oxycodone-acetaminophen, oxycodone-acetaminophen, requip [ropinirole], stadol [butorphanol tartrate], topamax, and tramadol.  MEDICATIONS:  Current Outpatient Medications  Medication Sig Dispense Refill   acetaminophen (TYLENOL) 650 MG CR tablet Take by mouth.     clobetasol (TEMOVATE) 0.05 % external solution Apply topically daily. (Patient not taking: Reported on 03/04/2022)     DULoxetine (CYMBALTA) 30 MG capsule TAKE 3 CAPSULES BY MOUTH EVERY DAY [id number] capsule 1   folic acid (FOLVITE) 1 MG tablet Take 1 tablet by mouth daily.     HYDROcodone-acetaminophen (NORCO/VICODIN) 5-325 MG tablet Take 1 tablet by mouth every 6 (six) hours as needed for moderate pain. 15 tablet 0   lamoTRIgine (LAMICTAL) 100 MG tablet Take 100 mg by mouth every evening.     levETIRAcetam (KEPPRA XR) 500 MG 24 hr tablet 4 (four) times  daily.  levothyroxine (SYNTHROID) 50 MCG tablet Take 1 tablet (50 mcg total) by mouth daily before breakfast. 90 tablet 1   lidocaine 4 % Place 1 patch onto the skin daily. On low back (Patient not taking: Reported on 03/04/2022) 30 patch 1   losartan (COZAAR) 50 MG tablet TAKE 1 TABLET BY MOUTH EVERY DAY 90 tablet 0   methotrexate (RHEUMATREX) 2.5 MG tablet Take by mouth. 4 tablets a week.     promethazine (PHENERGAN) 25 MG suppository Place 1 suppository (25 mg total) rectally every 6 (six) hours as needed for nausea or vomiting. 12 each 2   simvastatin (ZOCOR) 40 MG tablet TAKE 1 TABLET BY MOUTH EVERYDAY AT BEDTIME 90 tablet 3   SUMAtriptan (IMITREX) 100 MG tablet TAKE 1 TABLET (100 MG TOTAL) BY MOUTH EVERY 2 (TWO) HOURS AS NEEDED FOR MIGRAINE. 30 tablet 3   No current facility-administered medications for this visit.    PHYSICAL EXAMINATION: ECOG PERFORMANCE STATUS: {CHL ONC ECOG [phone]}  There were no vitals filed for this visit. There were no vitals filed for this visit.  BREAST:*** No palpable masses or nodules in either right or left breasts. No palpable axillary supraclavicular or infraclavicular adenopathy no breast tenderness or nipple discharge. (exam performed in the presence of a chaperone)  LABORATORY DATA:  I have reviewed the data as listed    Latest Ref Rng & Units 03/04/2022    3:19 PM 12/10/2021   12:00 AM 11/10/2021   12:00 AM  CMP  Glucose 70 - 99 mg/dL 84  95  99   BUN 8 - 23 mg/dL '10  7  14   '$ Creatinine 0.44 - 1.00 mg/dL 1.07  0.93  1.02   Sodium 135 - 145 mmol/L 140  139  141   Potassium 3.5 - 5.1 mmol/L 4.9  5.0  5.2   Chloride 98 - 111 mmol/L 102  105  105   CO2 22 - 32 mmol/L 32  30  29   Calcium 8.9 - 10.3 mg/dL 10.0  8.8  9.3   Total Protein 6.5 - 8.1 g/dL 7.0  6.2    Total Bilirubin 0.3 - 1.2 mg/dL 0.5  0.4    Alkaline Phos 38 - 126 U/L 69     AST 15 - 41 U/L 22  18    ALT 0 - 44 U/L 21  19      Lab Results  Component Value Date    WBC 4.6 03/04/2022   HGB 15.1 (H) 03/04/2022   HCT 45.7 03/04/2022   MCV 99.1 03/04/2022   PLT 227 03/04/2022   NEUTROABS 2.6 03/04/2022    ASSESSMENT & PLAN:  No problem-specific Assessment & Plan notes found for this encounter.    No orders of the defined types were placed in this encounter.  The patient has a good understanding of the overall plan. she agrees with it. she will call with any problems that may develop before the next visit here. Total time spent: 30 mins including face to face time and time spent for planning, charting and co-ordination of care   Suzzette Righter, [place] 04/08/22    I Gardiner Coins am acting as a Education administrator for [employer]  ***

## 2022-04-09 ENCOUNTER — Ambulatory Visit: Payer: Self-pay | Admitting: Surgery

## 2022-04-09 ENCOUNTER — Other Ambulatory Visit: Payer: Self-pay | Admitting: Surgery

## 2022-04-09 DIAGNOSIS — C50419 Malignant neoplasm of upper-outer quadrant of unspecified female breast: Secondary | ICD-10-CM

## 2022-04-12 ENCOUNTER — Ambulatory Visit: Payer: 59

## 2022-04-13 ENCOUNTER — Encounter: Payer: Self-pay | Admitting: *Deleted

## 2022-04-13 ENCOUNTER — Other Ambulatory Visit: Payer: Self-pay

## 2022-04-13 ENCOUNTER — Inpatient Hospital Stay: Payer: 59 | Attending: Hematology and Oncology | Admitting: Hematology and Oncology

## 2022-04-13 ENCOUNTER — Ambulatory Visit: Payer: 59 | Admitting: Family Medicine

## 2022-04-13 VITALS — BP 141/80 | HR 81 | Temp 97.5°F | Resp 16

## 2022-04-13 DIAGNOSIS — C50411 Malignant neoplasm of upper-outer quadrant of right female breast: Secondary | ICD-10-CM | POA: Diagnosis not present

## 2022-04-13 DIAGNOSIS — Z79811 Long term (current) use of aromatase inhibitors: Secondary | ICD-10-CM | POA: Insufficient documentation

## 2022-04-13 DIAGNOSIS — Z170 Estrogen receptor positive status [ER+]: Secondary | ICD-10-CM | POA: Insufficient documentation

## 2022-04-13 DIAGNOSIS — M8580 Other specified disorders of bone density and structure, unspecified site: Secondary | ICD-10-CM | POA: Insufficient documentation

## 2022-04-13 DIAGNOSIS — Z780 Asymptomatic menopausal state: Secondary | ICD-10-CM

## 2022-04-13 DIAGNOSIS — C50212 Malignant neoplasm of upper-inner quadrant of left female breast: Secondary | ICD-10-CM | POA: Insufficient documentation

## 2022-04-13 MED ORDER — LETROZOLE 2.5 MG PO TABS: 2.5000 mg | ORAL_TABLET | Freq: Every day | ORAL | 3 refills | Status: DC

## 2022-04-13 NOTE — Assessment & Plan Note (Addendum)
05/21/2020: Screening mammogram showed a possible mass in the right breast. Diagnostic mammogram and US showed a 0.5cm mass at the 12 o'clock position in the right breast, with no right axillary abnormalities. Biopsy showed invasive and in situ ductal carcinoma, grade 1, HER-2 equivocal by IHC, negative by FISH (ratio 1.22), ER+ 95%, PR+ 90%, Ki67 10%.    Treatment Summary 1. 06/26/20: Rt Lumpectomy: Grade 1 IDC with DCIS 0/2 LN neg, Margins neg, ER+ 95%, PR+ 90%, Ki67 10%. , Her 2 Neg 2. Adjuvant XRT: completed 09/24/2020 3. Followed by Adj Anti estrogen therapy (patient did not receive)

## 2022-04-13 NOTE — Assessment & Plan Note (Signed)
04/02/2022: Suspicious 0.9 cm mass left breast 10 o'clock position, indeterminate 1.5 cm group of calcifications UIQ left breast: Mass and calcifications span 3.7 cm.  No axillary lymph node, biopsy: Grade 1 IDC with DCIS ER 100%, PR 60%, Ki-67 5%, HER2 2+ by IHC FISH negative ratio 1.34, left UIQ biopsy: Low-grade DCIS ER 95%, PR 80%  Pathology and radiology counseling: Discussed with the patient, the details of pathology including the type of breast cancer,the clinical staging, the significance of ER, PR and HER-2/neu receptors and the implications for treatment. After reviewing the pathology in detail, we proceeded to discuss the different treatment options between surgery, radiation, chemotherapy, antiestrogen therapies.  Treatment plan: Breast conserving surgery with sentinel lymph node biopsy Adjuvant radiation Followed by adjuvant antiestrogen therapy with anastrozole 1 mg daily x 7 years  Bone density 10/03/2020: T-score -1.6: Osteopenia Return to clinic after surgery to discuss final pathology report

## 2022-04-16 ENCOUNTER — Ambulatory Visit: Payer: 59

## 2022-04-19 ENCOUNTER — Ambulatory Visit
Admission: RE | Admit: 2022-04-19 | Discharge: 2022-04-19 | Disposition: A | Discharge status: Home / Self Care | Payer: 59 | Source: Ambulatory Visit | Attending: Surgery | Admitting: Surgery

## 2022-04-19 DIAGNOSIS — C50419 Malignant neoplasm of upper-outer quadrant of unspecified female breast: Secondary | ICD-10-CM

## 2022-04-19 MED ORDER — GADOPICLENOL 0.5 MMOL/ML IV SOLN
10.0000 mL | Freq: Once | INTRAVENOUS | Status: AC | PRN
  Administered 2022-04-19: 10 mL via INTRAVENOUS

## 2022-04-20 ENCOUNTER — Encounter: Payer: Self-pay | Admitting: *Deleted

## 2022-04-20 ENCOUNTER — Ambulatory Visit: Payer: Self-pay | Admitting: Surgery

## 2022-04-20 DIAGNOSIS — C50912 Malignant neoplasm of unspecified site of left female breast: Secondary | ICD-10-CM

## 2022-04-30 ENCOUNTER — Telehealth: Payer: Self-pay | Admitting: Hematology and Oncology

## 2022-04-30 ENCOUNTER — Encounter: Payer: Self-pay | Admitting: *Deleted

## 2022-04-30 DIAGNOSIS — Z170 Estrogen receptor positive status [ER+]: Secondary | ICD-10-CM

## 2022-04-30 NOTE — Telephone Encounter (Signed)
Scheduled appointment per scheduling message. Patient is aware of the made appointment. 

## 2022-05-02 IMAGING — MG MM BREAST SURGICAL SPECIMEN
1 series · 2 of 2 positions shown · non-contrast
Comparison: Previous exam(s).

CLINICAL DATA: Status post excision of the right breast following
radioactive seed localization.

EXAM:
SPECIMEN RADIOGRAPH OF THE RIGHT BREAST

[Series 2: R · right · 0.07mm/px · 2 of 2 slices shown]
[im 1/2]
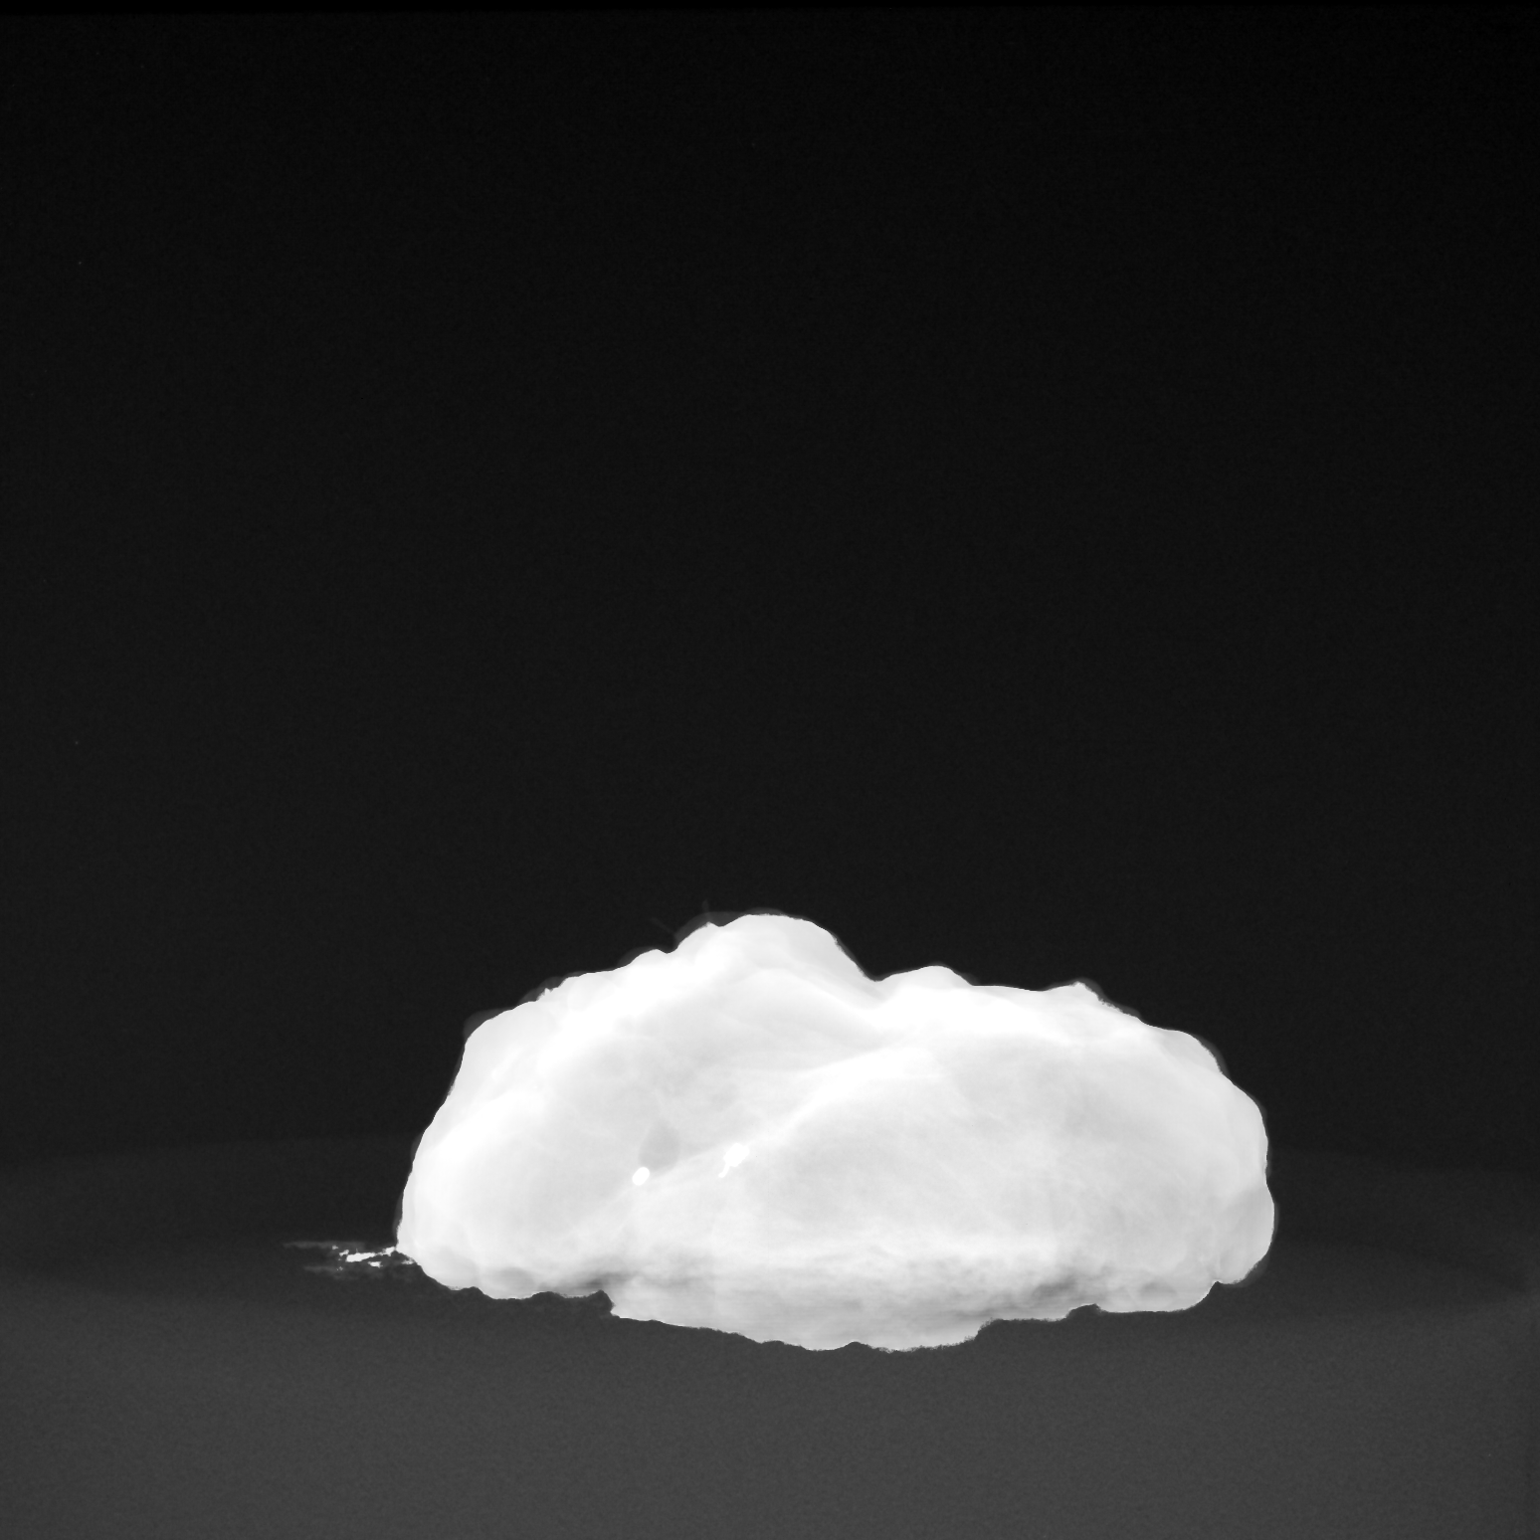
[im 2/2]
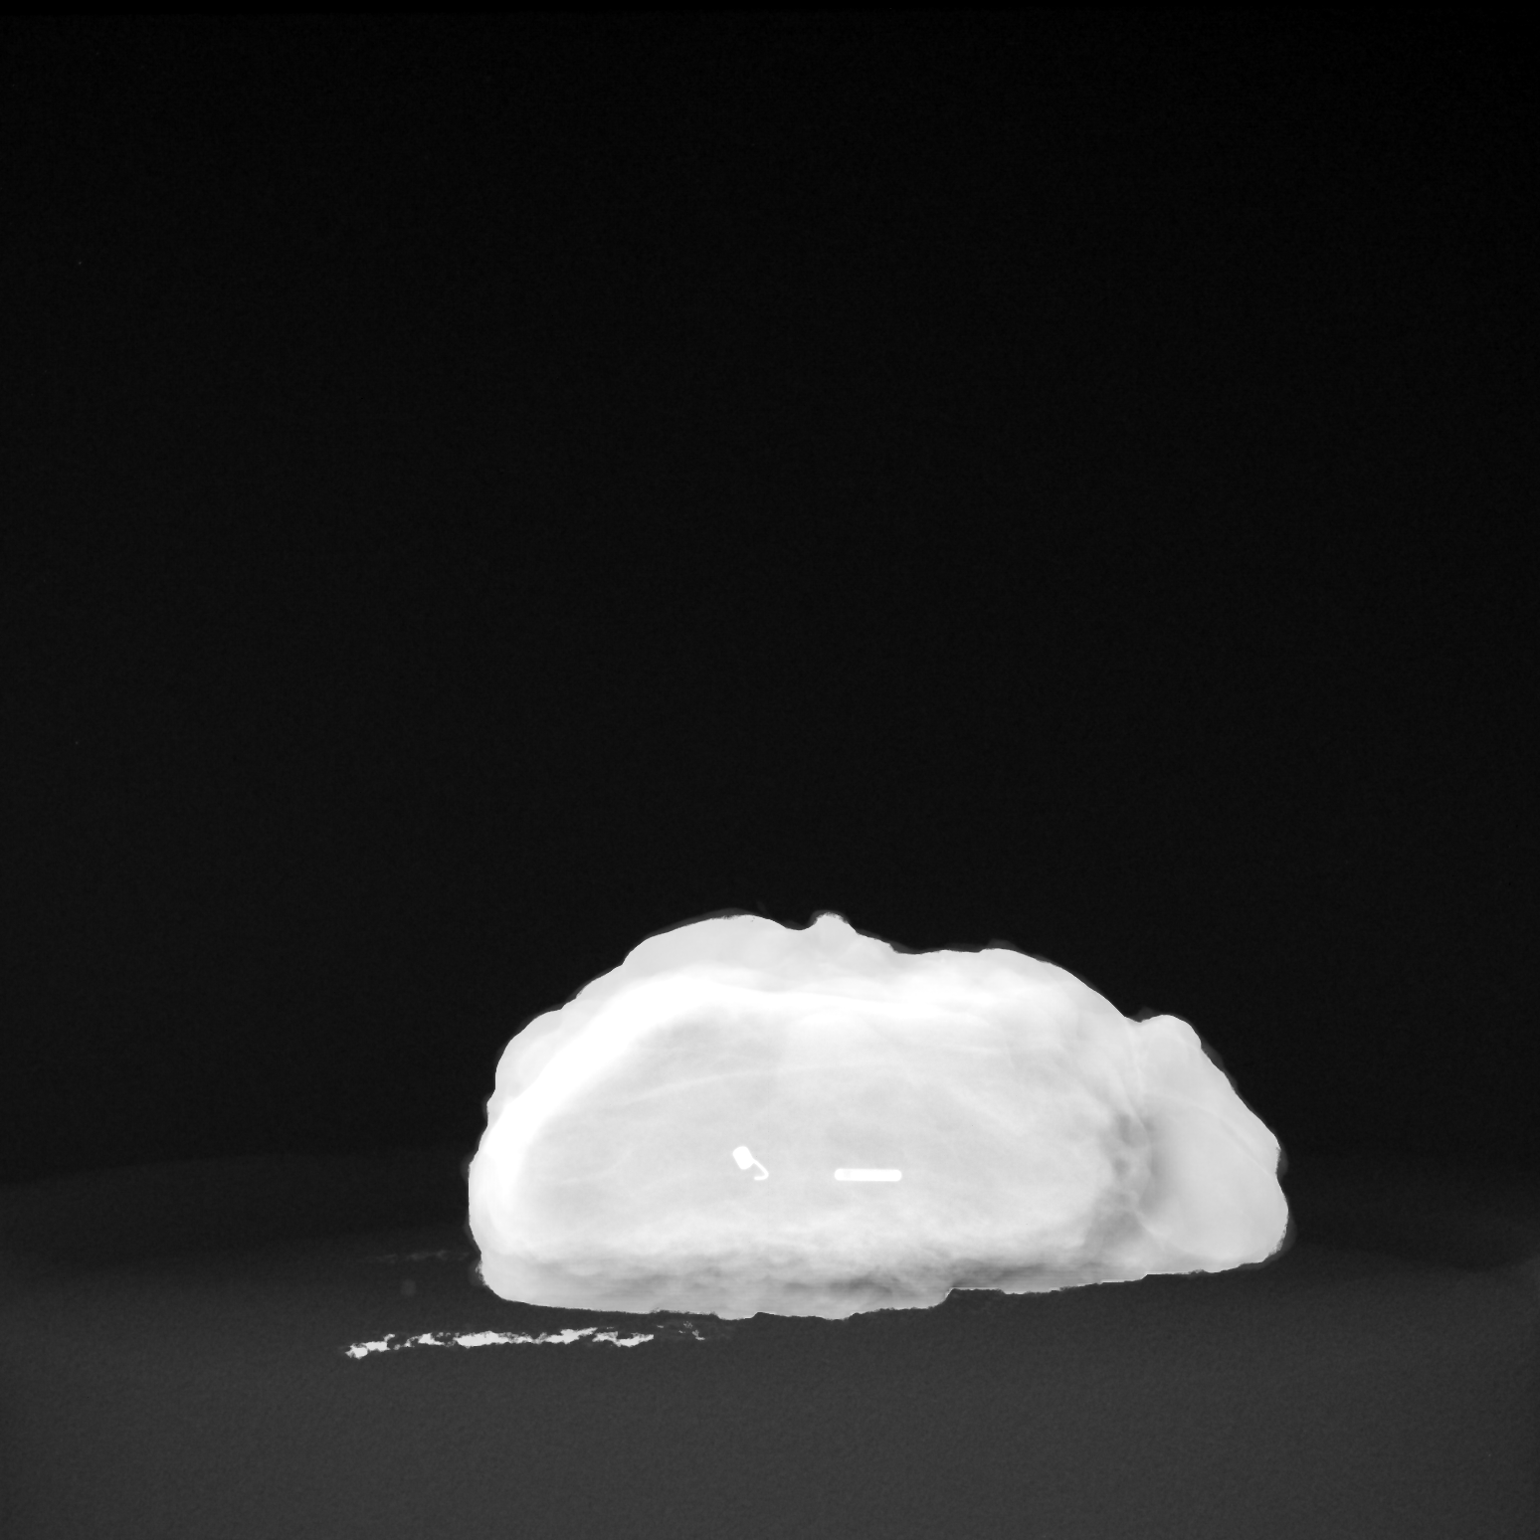

[2 of 2 positions shown; findings below may reference images not displayed]

FINDINGS: Status post excision of the right breast. The radioactive seed and
biopsy marker clip are present, completely intact, and were marked
for pathology.
IMPRESSION: Specimen radiograph of the right breast.

## 2022-05-03 ENCOUNTER — Other Ambulatory Visit: Payer: Self-pay | Admitting: Surgery

## 2022-05-03 DIAGNOSIS — C50912 Malignant neoplasm of unspecified site of left female breast: Secondary | ICD-10-CM

## 2022-05-03 NOTE — Progress Notes (Signed)
Location of Breast Cancer: Malignant neoplasm of upper-outer quadrant of left breast in female, estrogen receptor positive   Histology per Pathology Report:  04-05-22   06-26-20 FINAL MICROSCOPIC DIAGNOSIS:  A. BREAST, RIGHT, LUMPECTOMY: -  Invasive ductal carcinoma, Nottingham grade 1 of 3, 0.2 cm -  Ductal carcinoma in-situ, intermediate grade -  Margins uninvolved by carcinoma (0.25 cm; posterior margin) -  Previous biopsy site changes present -  See oncology table and comment below  B. LYMPH NODE, RIGHT AXILLARY, SENTINEL, EXCISION: -  No carcinoma identified in one lymph node (0/1)  C. LYMPH NODE, RIGHT AXILLARY, SENTINEL, EXCISION: -  No carcinoma identified in one lymph node (0/1)  ONCOLOGY TABLE:  INVASIVE CARCINOMA OF THE BREAST:  Resection  Procedure: Excision Specimen Laterality: Right Histologic Type: Invasive carcinoma of no special type (ductal, NOS) Histologic Grade:      Glandular (Acinar)/Tubular Differentiation: Score 2      Nuclear Pleomorphism: Score 2      Mitotic Rate: Score 1      Overall Grade: Nottingham grade 1 Tumor Size: 0.5 cm (biopsy measurement) Ductal Carcinoma In Situ: Present Tumor Extent: Limited to breast parenchyma Treatment Effect in the Breast: No known presurgical therapy Margins: All margins negative for invasive carcinoma      Distance from Closest Margin (mm): 2.5      Specify Closest Margin (required only if <38mm): Posterior DCIS Margins: Uninvolved by DCIS      Distance from Closest Margin (mm): 2      Specify Closest Margin (required only if <59mm): Lateral Regional Lymph Nodes:      Number of Lymph Nodes Examined: 2      Number of Sentinel Nodes Examined: 2      Number of Lymph Nodes with Macrometastases (>2 mm): 0      Number of Lymph Nodes with Micrometastases: 0      Number of Lymph Nodes with Isolated Tumor Cells (=0.2 mm or =200 cells): 0      Size of Largest Metastatic Deposit (mm): N/A      Extranodal Extension:  N/A Distant Metastasis:      Distant Site(s) Involved: N/A Breast Biomarker Testing Performed on Previous Biopsy:      Testing Performed on Case Number: [id number]            Estrogen Receptor: POSITIVE, 95% STRONG            Progesterone Receptor: POSITIVE, 90% STRONG            HER2: NEGATIVE (FISH, group 5)            Ki-67: 10% Pathologic Stage Classification (pTNM, AJCC 8th Edition): pT1a, pN0 Representative Tumor Block: A4 Comment(s): The focus of invasive carcinoma on the resection specimen is 0.2 cm in greatest extent; therefore, the biopsy site measurement is utilized for the oncology table. (v4.5.0.0)   Receptor Status: ER(100% positive), PR (60% positive), Her2-neu (neg), Ki-67(5%) 04-06-22   Did patient present with symptoms (if so, please note symptoms) or was this found on screening mammography?:   Past/Anticipated interventions by surgeon, if any: 06-26-20 Preoperative diagnosis: Stage I right  breast cancer upper-outer quadrant    Postoperative diagnosis: Same    Procedure: right  breast seed localized lumpectomy with left axillary sentinel lymph node mapping     Surgeon: Erroll Luna M.D.     Past/Anticipated interventions by medical oncology, if any:  Dr. Lindi Adie on 04-13-22 Oncology History  Malignant neoplasm of upper-outer quadrant of right  breast in female, estrogen receptor positive ([place])  05/21/2020 Initial Diagnosis    Screening mammogram showed a possible mass in the right breast. Diagnostic mammogram and US showed a 0.5cm mass at the 12 o'clock position in the right breast, with no right axillary abnormalities. Biopsy showed invasive and in situ ductal carcinoma, grade 1, HER-2 equivocal by IHC, negative by FISH (ratio 1.22), ER+ 95%, PR+ 90%, Ki67 10%.     05/28/2020 Cancer Staging    Staging form: Breast, AJCC 8th Edition - Clinical stage from 05/28/2020: Stage IA (cT1a, cN0, cM0, G2, ER+, PR+, HER2-) - Signed by Nicholas Lose, MD on  05/28/2020 Stage prefix: Initial diagnosis Method of lymph node assessment: Clinical Histologic grading system: 3 grade system    06/04/2020 Genetic Testing    Negative hereditary cancer genetic testing: no pathogenic variants detected in [facility] Multi-Cancer Panel.  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  The report date is June 04, 2020.    The Multi-Cancer Panel offered by [facility] includes sequencing and/or deletion duplication testing of the following 84 genes: AIP, ALK, APC, ATM, AXIN2,BAP1,  BARD1, BLM, BMPR1A, BRCA1, BRCA2, BRIP1, CASR, CDC73, CDH1, CDK4, CDKN1B, CDKN1C, CDKN2A (p14ARF), CDKN2A (p16INK4a), CEBPA, CHEK2, CTNNA1, DICER1, DIS3L2, EGFR (c.2369C>T, p.Thr790Met variant only), EPCAM (Deletion/duplication testing only), FH, FLCN, GATA2, GPC3, GREM1 (Promoter region deletion/duplication testing only), HOXB13 (c.251G>A, p.Gly84Glu), HRAS, KIT, MAX, MEN1, MET, MITF (c.952G>A, p.Glu318Lys variant only), MLH1, MSH2, MSH3, MSH6, MUTYH, NBN, NF1, NF2, NTHL1, PALB2, PDGFRA, PHOX2B, PMS2, POLD1, POLE, POT1, PRKAR1A, PTCH1, PTEN, RAD50, RAD51C, RAD51D, RB1, RECQL4, RET, RUNX1, SDHAF2, SDHA (sequence changes only), SDHB, SDHC, SDHD, SMAD4, SMARCA4, SMARCB1, SMARCE1, STK11, SUFU, TERC, TERT, TMEM127, TP53, TSC1, TSC2, VHL, WRN and WT1.     06/26/2020 Surgery    Right lumpectomy ([facility]): IDC, grade 1, 0.2cm, with intermediate grade DCIS, and 2 right axillary lymph nodes negative for carcinoma.    Malignant neoplasm of upper-inner quadrant of left breast in female, estrogen receptor positive ([place])  04/02/2022 Initial Diagnosis    Suspicious 0.9 cm mass left breast 10 o'clock position, indeterminate 1.5 cm group of calcifications UIQ left breast: Mass and calcifications span 3.7 cm.  No axillary lymph node, biopsy: Grade 1 IDC with DCIS ER 100%, PR 60%, Ki-67 5%, HER2 2+ by IHC FISH negative ratio 1.34, left UIQ biopsy: Low-grade DCIS ER 95%, PR 80%      ASSESSMENT & PLAN:  Malignant neoplasm of upper-outer quadrant of right breast in female, estrogen receptor positive ([place]) 05/21/2020: Screening mammogram showed a possible mass in the right breast. Diagnostic mammogram and US showed a 0.5cm mass at the 12 o'clock position in the right breast, with no right axillary abnormalities. Biopsy showed invasive and in situ ductal carcinoma, grade 1, HER-2 equivocal by IHC, negative by FISH (ratio 1.22), ER+ 95%, PR+ 90%, Ki67 10%.    Treatment Summary 1. 06/26/20: Rt Lumpectomy: Grade 1 IDC with DCIS 0/2 LN neg, Margins neg, ER+ 95%, PR+ 90%, Ki67 10%. , Her 2 Neg 2. Adjuvant XRT: completed 09/24/2020 3. Followed by Adj Anti estrogen therapy (patient stopped after taking 1 tablet because of nausea)     Malignant neoplasm of upper-inner quadrant of left breast in female, estrogen receptor positive ([place]) 04/02/2022: Suspicious 0.9 cm mass left breast 10 o'clock position, indeterminate 1.5 cm group of calcifications UIQ left breast: Mass and calcifications span 3.7 cm.  No axillary lymph node, biopsy: Grade 1 IDC with DCIS ER 100%, PR 60%, Ki-67 5%, HER2 2+  by IHC FISH negative ratio 1.34, left UIQ biopsy: Low-grade DCIS ER 95%, PR 80%   Pathology and radiology counseling: Discussed with the patient, the details of pathology including the type of breast cancer,the clinical staging, the significance of ER, PR and HER-2/neu receptors and the implications for treatment. After reviewing the pathology in detail, we proceeded to discuss the different treatment options between surgery, radiation, chemotherapy, antiestrogen therapies.   Treatment plan: Breast conserving surgery with sentinel lymph node biopsy Adjuvant radiation Followed by adjuvant antiestrogen therapy with letrozole 2.5 mg daily x 5 years   Bone density 10/03/2020: T-score -1.6: Osteopenia I discussed with her that we can start the letrozole tablet today and see if she can tolerate it. I sent a  message to Dr. Brantley Stage that patient prefers lumpectomy instead of mastectomy.   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  I will refer her to genetic counseling.  She does not do genetic test in 2012   Return to clinic after surgery to discuss final pathology report  Lymphedema issues, if any:  {:18581} {t:21944}   Pain issues, if any:  {:18581} {PAIN DESCRIPTION:[id number]}  SAFETY ISSUES: Prior radiation? {:18581} Pacemaker/ICD? {:18581} Possible current pregnancy?{:18581} Is the patient on methotrexate? {:18581}  Current Complaints / other details:  ***
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
## 2022-05-05 ENCOUNTER — Other Ambulatory Visit: Payer: 59

## 2022-05-05 ENCOUNTER — Encounter: Payer: Self-pay | Admitting: Radiation Oncology

## 2022-05-05 ENCOUNTER — Ambulatory Visit
Admission: RE | Admit: 2022-05-05 | Discharge: 2022-05-05 | Disposition: A | Discharge status: Home / Self Care | Payer: 59 | Source: Ambulatory Visit | Attending: Radiation Oncology | Admitting: Radiation Oncology

## 2022-05-05 VITALS — BP 133/73 | HR 81 | Temp 97.8°F | Resp 20 | Ht 63.0 in | Wt 239.8 lb

## 2022-05-05 DIAGNOSIS — C50412 Malignant neoplasm of upper-outer quadrant of left female breast: Secondary | ICD-10-CM | POA: Diagnosis not present

## 2022-05-05 DIAGNOSIS — N189 Chronic kidney disease, unspecified: Secondary | ICD-10-CM | POA: Diagnosis not present

## 2022-05-05 DIAGNOSIS — Z923 Personal history of irradiation: Secondary | ICD-10-CM | POA: Diagnosis not present

## 2022-05-05 DIAGNOSIS — Z85828 Personal history of other malignant neoplasm of skin: Secondary | ICD-10-CM | POA: Insufficient documentation

## 2022-05-05 DIAGNOSIS — G2581 Restless legs syndrome: Secondary | ICD-10-CM | POA: Insufficient documentation

## 2022-05-05 DIAGNOSIS — G4733 Obstructive sleep apnea (adult) (pediatric): Secondary | ICD-10-CM | POA: Insufficient documentation

## 2022-05-05 DIAGNOSIS — E538 Deficiency of other specified B group vitamins: Secondary | ICD-10-CM | POA: Insufficient documentation

## 2022-05-05 DIAGNOSIS — M797 Fibromyalgia: Secondary | ICD-10-CM | POA: Diagnosis not present

## 2022-05-05 DIAGNOSIS — I129 Hypertensive chronic kidney disease with stage 1 through stage 4 chronic kidney disease, or unspecified chronic kidney disease: Secondary | ICD-10-CM | POA: Insufficient documentation

## 2022-05-05 DIAGNOSIS — Z806 Family history of leukemia: Secondary | ICD-10-CM | POA: Diagnosis not present

## 2022-05-05 DIAGNOSIS — C50212 Malignant neoplasm of upper-inner quadrant of left female breast: Secondary | ICD-10-CM

## 2022-05-05 DIAGNOSIS — Z803 Family history of malignant neoplasm of breast: Secondary | ICD-10-CM | POA: Diagnosis not present

## 2022-05-05 DIAGNOSIS — E038 Other specified hypothyroidism: Secondary | ICD-10-CM | POA: Diagnosis not present

## 2022-05-05 DIAGNOSIS — Z79811 Long term (current) use of aromatase inhibitors: Secondary | ICD-10-CM | POA: Insufficient documentation

## 2022-05-05 DIAGNOSIS — Z801 Family history of malignant neoplasm of trachea, bronchus and lung: Secondary | ICD-10-CM | POA: Insufficient documentation

## 2022-05-05 DIAGNOSIS — Z79899 Other long term (current) drug therapy: Secondary | ICD-10-CM | POA: Diagnosis not present

## 2022-05-05 DIAGNOSIS — Z79890 Hormone replacement therapy (postmenopausal): Secondary | ICD-10-CM | POA: Insufficient documentation

## 2022-05-05 DIAGNOSIS — E7800 Pure hypercholesterolemia, unspecified: Secondary | ICD-10-CM | POA: Diagnosis not present

## 2022-05-05 DIAGNOSIS — M3500 Sicca syndrome, unspecified: Secondary | ICD-10-CM | POA: Insufficient documentation

## 2022-05-05 DIAGNOSIS — Z170 Estrogen receptor positive status [ER+]: Secondary | ICD-10-CM | POA: Diagnosis not present

## 2022-05-05 DIAGNOSIS — D0512 Intraductal carcinoma in situ of left breast: Secondary | ICD-10-CM

## 2022-05-10 ENCOUNTER — Other Ambulatory Visit: Payer: Self-pay | Admitting: *Deleted

## 2022-05-10 ENCOUNTER — Telehealth: Payer: Self-pay | Admitting: Radiation Oncology

## 2022-05-10 DIAGNOSIS — Z170 Estrogen receptor positive status [ER+]: Secondary | ICD-10-CM

## 2022-05-10 NOTE — Telephone Encounter (Signed)
LVM to schedule FUN/SIM post-sx with Dr. Sondra Come.

## 2022-05-18 ENCOUNTER — Encounter: Payer: Self-pay | Admitting: Genetic Counselor

## 2022-05-18 ENCOUNTER — Inpatient Hospital Stay: Payer: 59 | Attending: Hematology and Oncology | Admitting: Genetic Counselor

## 2022-05-18 ENCOUNTER — Inpatient Hospital Stay: Payer: 59

## 2022-05-18 ENCOUNTER — Other Ambulatory Visit: Payer: Self-pay

## 2022-05-18 ENCOUNTER — Other Ambulatory Visit: Payer: Self-pay | Admitting: Genetic Counselor

## 2022-05-18 DIAGNOSIS — C50212 Malignant neoplasm of upper-inner quadrant of left female breast: Secondary | ICD-10-CM | POA: Diagnosis not present

## 2022-05-18 DIAGNOSIS — D0512 Intraductal carcinoma in situ of left breast: Secondary | ICD-10-CM

## 2022-05-18 DIAGNOSIS — C50411 Malignant neoplasm of upper-outer quadrant of right female breast: Secondary | ICD-10-CM | POA: Insufficient documentation

## 2022-05-18 DIAGNOSIS — Z923 Personal history of irradiation: Secondary | ICD-10-CM | POA: Insufficient documentation

## 2022-05-18 DIAGNOSIS — Z170 Estrogen receptor positive status [ER+]: Secondary | ICD-10-CM | POA: Insufficient documentation

## 2022-05-18 DIAGNOSIS — Z803 Family history of malignant neoplasm of breast: Secondary | ICD-10-CM

## 2022-05-18 DIAGNOSIS — Z800 Family history of malignant neoplasm of digestive organs: Secondary | ICD-10-CM

## 2022-05-18 DIAGNOSIS — Z806 Family history of leukemia: Secondary | ICD-10-CM

## 2022-05-18 DIAGNOSIS — M8580 Other specified disorders of bone density and structure, unspecified site: Secondary | ICD-10-CM | POA: Insufficient documentation

## 2022-05-18 DIAGNOSIS — Z807 Family history of other malignant neoplasms of lymphoid, hematopoietic and related tissues: Secondary | ICD-10-CM

## 2022-05-18 DIAGNOSIS — Z79811 Long term (current) use of aromatase inhibitors: Secondary | ICD-10-CM | POA: Insufficient documentation

## 2022-05-18 LAB — GENETIC SCREENING ORDER

## 2022-05-18 NOTE — Pre-Procedure Instructions (Signed)
Surgical Instructions    Your procedure is scheduled on Thursday, May 27, 2022  Report to [facility] Main Entrance "A" at 10 A.M., then check in with the Admitting office.  Call this number if you have problems the morning of surgery:  [phone]  If you have any questions prior to your surgery date call [phone]: Open Monday-Friday 8am-4pm If you experience any cold or flu symptoms such as cough, fever, chills, shortness of breath, etc. between now and your scheduled surgery, please notify us at the above number.   You may drink clear liquids until 9 AM the morning of your surgery.   Clear liquids allowed are: Water, Non-Citrus Juices (without pulp), Carbonated Beverages, Clear Tea, Black Coffee Only (NO MILK, CREAM OR POWDERED CREAMER of any kind), and Gatorade.   Remember:   Do not eat after midnight the night before your surgery Patient Instructions  The night before surgery:  No food after midnight. ONLY clear liquids after midnight  The day of surgery (if you do NOT have diabetes):  Drink ONE (1) Pre-Surgery Clear Ensure by 9 AM the morning of surgery. Drink in one sitting. Do not sip.  This drink was given to you during your hospital  pre-op appointment visit.  Nothing else to drink after completing the  Pre-Surgery Clear Ensure.         Take these medicines the morning of surgery with A SIP OF WATER  DULoxetine (CYMBALTA ) levothyroxine (SYNTHROID)  levETIRAcetam (KEPPRA XR)  lamoTRIgine (LAMICTAL) letrozole [facility])    As needed :  Tylenol HYDROcodone-acetaminophen (NORCO/VICODIN) promethazine (PHENERGAN) SUMAtriptan (IMITREX)   As of today, STOP taking any Aspirin (unless otherwise instructed by your surgeon) Aleve, Naproxen, Ibuprofen, Motrin, Advil, Goody's, BC's, all herbal medications, fish oil, and all vitamins.                     Do NOT Smoke (Tobacco/Vaping) for 24 hours prior to your procedure.  If you use a CPAP at night, you may  bring your mask/headgear for your overnight stay.   Contacts, glasses, piercing's, hearing aid's, dentures or partials may not be worn into surgery, please bring cases for these belongings.    For patients admitted to the hospital, discharge time will be determined by your treatment team.   Patients discharged the day of surgery will not be allowed to drive home, and someone needs to stay with them for 24 hours.  SURGICAL WAITING ROOM VISITATION Patients having surgery or a procedure may have no more than 2 support people in the waiting area - these visitors may rotate.   Children under the age of 77 must have an adult with them who is not the patient. If the patient needs to stay at the hospital during part of their recovery, the visitor guidelines for inpatient rooms apply. Pre-op nurse will coordinate an appropriate time for 1 support person to accompany patient in pre-op.  This support person may not rotate.   Please refer to the [facility] website for the visitor guidelines for Inpatients (after your surgery is over and you are in a regular room).    Special instructions:   [place]- Preparing For Surgery  Before surgery, you can play an important role. Because skin is not sterile, your skin needs to be as free of germs as possible. You can reduce the number of germs on your skin by washing with CHG (chlorahexidine gluconate) Soap before surgery.  CHG is an antiseptic cleaner which kills  germs and bonds with the skin to continue killing germs even after washing.    Oral Hygiene is also important to reduce your risk of infection.  Remember - BRUSH YOUR TEETH THE MORNING OF SURGERY WITH YOUR REGULAR TOOTHPASTE  Please do not use if you have an allergy to CHG or antibacterial soaps. If your skin becomes reddened/irritated stop using the CHG.  Do not shave (including legs and underarms) for at least 48 hours prior to first CHG shower. It is OK to shave your face.  Please follow these  instructions carefully.   Shower the NIGHT BEFORE SURGERY and the MORNING OF SURGERY  If you chose to wash your hair, wash your hair first as usual with your normal shampoo.  After you shampoo, rinse your hair and body thoroughly to remove the shampoo.  Use CHG Soap as you would any other liquid soap. You can apply CHG directly to the skin and wash gently with a scrungie or a clean washcloth.   Apply the CHG Soap to your body ONLY FROM THE NECK DOWN.  Do not use on open wounds or open sores. Avoid contact with your eyes, ears, mouth and genitals (private parts). Wash Face and genitals (private parts)  with your normal soap.   Wash thoroughly, paying special attention to the area where your surgery will be performed.  Thoroughly rinse your body with warm water from the neck down.  DO NOT shower/wash with your normal soap after using and rinsing off the CHG Soap.  Pat yourself dry with a CLEAN TOWEL.  Wear CLEAN PAJAMAS to bed the night before surgery  Place CLEAN SHEETS on your bed the night before your surgery  DO NOT SLEEP WITH PETS.   Day of Surgery: Take a shower with CHG soap. Do not wear jewelry or makeup Do not wear lotions, powders, perfumes/colognes, or deodorant. Do not shave 48 hours prior to surgery.  Men may shave face and neck. Do not bring valuables to the hospital.  [facility] is not responsible for any belongings or valuables. Do not wear nail polish, gel polish, artificial nails, or any other type of covering on natural nails (fingers and toes) If you have artificial nails or gel coating that need to be removed by a nail salon, please have this removed prior to surgery. Artificial nails or gel coating may interfere with anesthesia's ability to adequately monitor your vital signs. Wear Clean/Comfortable clothing the morning of surgery Remember to brush your teeth WITH YOUR REGULAR TOOTHPASTE.   Please read over the following fact sheets that you were  given.    If you received a COVID test during your pre-op visit  it is requested that you wear a mask when out in public, stay away from anyone that may not be feeling well and notify your surgeon if you develop symptoms. If you have been in contact with anyone that has tested positive in the last 10 days please notify you surgeon.
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
## 2022-05-19 ENCOUNTER — Encounter (HOSPITAL_COMMUNITY): Payer: Self-pay

## 2022-05-19 ENCOUNTER — Other Ambulatory Visit: Payer: Self-pay

## 2022-05-19 ENCOUNTER — Encounter (HOSPITAL_COMMUNITY)
Admission: RE | Admit: 2022-05-19 | Discharge: 2022-05-19 | Disposition: A | Discharge status: Home / Self Care | Payer: 59 | Source: Ambulatory Visit | Attending: Surgery | Admitting: Surgery

## 2022-05-19 VITALS — BP 106/62 | HR 92 | Temp 97.9°F | Resp 19 | Ht 63.0 in | Wt 240.9 lb

## 2022-05-19 DIAGNOSIS — E038 Other specified hypothyroidism: Secondary | ICD-10-CM | POA: Diagnosis not present

## 2022-05-19 DIAGNOSIS — I129 Hypertensive chronic kidney disease with stage 1 through stage 4 chronic kidney disease, or unspecified chronic kidney disease: Secondary | ICD-10-CM | POA: Diagnosis not present

## 2022-05-19 DIAGNOSIS — M3500 Sicca syndrome, unspecified: Secondary | ICD-10-CM | POA: Diagnosis not present

## 2022-05-19 DIAGNOSIS — Z01812 Encounter for preprocedural laboratory examination: Secondary | ICD-10-CM | POA: Insufficient documentation

## 2022-05-19 DIAGNOSIS — M797 Fibromyalgia: Secondary | ICD-10-CM | POA: Diagnosis not present

## 2022-05-19 DIAGNOSIS — Z01818 Encounter for other preprocedural examination: Secondary | ICD-10-CM

## 2022-05-19 DIAGNOSIS — C50912 Malignant neoplasm of unspecified site of left female breast: Secondary | ICD-10-CM | POA: Diagnosis not present

## 2022-05-19 DIAGNOSIS — N189 Chronic kidney disease, unspecified: Secondary | ICD-10-CM | POA: Diagnosis not present

## 2022-05-19 DIAGNOSIS — G40909 Epilepsy, unspecified, not intractable, without status epilepticus: Secondary | ICD-10-CM | POA: Insufficient documentation

## 2022-05-19 DIAGNOSIS — E7800 Pure hypercholesterolemia, unspecified: Secondary | ICD-10-CM | POA: Insufficient documentation

## 2022-05-19 DIAGNOSIS — G4733 Obstructive sleep apnea (adult) (pediatric): Secondary | ICD-10-CM | POA: Insufficient documentation

## 2022-05-19 DIAGNOSIS — I10 Essential (primary) hypertension: Secondary | ICD-10-CM

## 2022-05-19 HISTORY — DX: Family history of other specified conditions: Z84.89

## 2022-05-19 LAB — BASIC METABOLIC PANEL
Anion gap: 8 (ref 5–15)
BUN: 12 mg/dL (ref 8–23)
CO2: 29 mmol/L (ref 22–32)
Calcium: 9.4 mg/dL (ref 8.9–10.3)
Chloride: 101 mmol/L (ref 98–111)
Creatinine, Ser: 1.02 mg/dL — ABNORMAL HIGH (ref 0.44–1.00)
GFR, Estimated: >60 mL/min (ref >60)
Glucose, Bld: 88 mg/dL (ref 70–99)
Potassium: 5.6 mmol/L — ABNORMAL HIGH (ref 3.5–5.1)
Sodium: 138 mmol/L (ref 135–145)

## 2022-05-19 LAB — CBC
HCT: 46.6 % — ABNORMAL HIGH (ref 36.0–46.0)
Hemoglobin: 14.8 g/dL (ref 12.0–15.0)
MCH: 31.9 pg (ref 26.0–34.0)
MCHC: 31.8 g/dL (ref 30.0–36.0)
MCV: 100.4 fL — ABNORMAL HIGH (ref 80.0–100.0)
Platelets: 232 10*3/uL (ref 150–400)
RBC: 4.64 MIL/uL (ref 3.87–5.11)
RDW: 12.9 % (ref 11.5–15.5)
WBC: 5.8 10*3/uL (ref 4.0–10.5)
nRBC: 0 % (ref 0.0–0.2)

## 2022-05-19 NOTE — Progress Notes (Signed)
PCP - Dr. Beatrice Lecher Cardiologist - denies  PPM/ICD - denies   Chest x-ray - 12/09/21 EKG - 12/09/21 Stress Test - denies ECHO - denies Cardiac Cath - denies  Sleep Study - 15 years ago per pt, OSA+ CPAP - denies, unable to tolerate  DM- denies  ASA/Blood Thinner Instructions: n/a   ERAS Protcol - yes PRE-SURGERY Ensure given at PAT  COVID TEST- n/a   Anesthesia review: yes, seed placement  Patient denies shortness of breath, fever, cough and chest pain at PAT appointment   All instructions explained to the patient, with a verbal understanding of the material. Patient agrees to go over the instructions while at home for a better understanding. The opportunity to ask questions was provided.

## 2022-05-20 NOTE — Anesthesia Preprocedure Evaluation (Addendum)
Anesthesia Evaluation  Patient identified by MRN, date of birth, ID band Patient awake    Reviewed: Allergy & Precautions, NPO status , Patient's Chart, lab work & pertinent test results  History of Anesthesia Complications (+) PONV, Family history of anesthesia reaction and history of anesthetic complications  Airway Mallampati: II       Dental no notable dental hx.    Pulmonary sleep apnea and Continuous Positive Airway Pressure Ventilation  Non compliant with CPAP   Pulmonary exam normal breath sounds clear to auscultation       Cardiovascular hypertension, Pt. on medications Normal cardiovascular exam Rhythm:Regular Rate:Normal     Neuro/Psych  Headaches, Seizures -, Well Controlled,  PSYCHIATRIC DISORDERS Anxiety Depression     Neuromuscular disease    GI/Hepatic negative GI ROS, Neg liver ROS,,,  Endo/Other  Hypothyroidism  Morbid obesityLeft Breast Ca Hyperlipidemia  Renal/GU Renal InsufficiencyRenal disease  negative genitourinary   Musculoskeletal  (+) Arthritis , Osteoarthritis,  Fibromyalgia -, narcotic dependent  Abdominal  (+) + obese  Peds  Hematology negative hematology ROS (+)   Anesthesia Other Findings   Reproductive/Obstetrics                              Anesthesia Physical Anesthesia Plan  ASA: 3  Anesthesia Plan: General   Post-op Pain Management: Regional block* and Minimal or no pain anticipated   Induction: Intravenous  PONV Risk Score and Plan: 4 or greater and Treatment may vary due to age or medical condition, Scopolamine patch - Pre-op, Ondansetron and Dexamethasone  Airway Management Planned: LMA  Additional Equipment: None  Intra-op Plan:   Post-operative Plan: Extubation in OR  Informed Consent: I have reviewed the patients History and Physical, chart, labs and discussed the procedure including the risks, benefits and alternatives for the  proposed anesthesia with the patient or authorized representative who has indicated his/her understanding and acceptance.     Dental advisory given  Plan Discussed with: CRNA and Anesthesiologist  Anesthesia Plan Comments: (PAT note written 05/20/2022 by Shonna Chock, PA-C.  )        Anesthesia Quick Evaluation
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
## 2022-05-25 ENCOUNTER — Ambulatory Visit (INDEPENDENT_AMBULATORY_CARE_PROVIDER_SITE_OTHER): Payer: 59

## 2022-05-25 ENCOUNTER — Telehealth: Payer: Self-pay | Admitting: Genetic Counselor

## 2022-05-25 ENCOUNTER — Ambulatory Visit (INDEPENDENT_AMBULATORY_CARE_PROVIDER_SITE_OTHER): Payer: 59 | Admitting: Family Medicine

## 2022-05-25 ENCOUNTER — Ambulatory Visit
Admission: RE | Admit: 2022-05-25 | Discharge: 2022-05-25 | Disposition: A | Discharge status: Home / Self Care | Payer: 59 | Source: Ambulatory Visit | Attending: Surgery | Admitting: Surgery

## 2022-05-25 VITALS — BP 127/75 | HR 100 | Ht 63.0 in | Wt 232.0 lb

## 2022-05-25 DIAGNOSIS — E049 Nontoxic goiter, unspecified: Secondary | ICD-10-CM

## 2022-05-25 DIAGNOSIS — R1310 Dysphagia, unspecified: Secondary | ICD-10-CM

## 2022-05-25 DIAGNOSIS — C50912 Malignant neoplasm of unspecified site of left female breast: Secondary | ICD-10-CM

## 2022-05-25 DIAGNOSIS — R221 Localized swelling, mass and lump, neck: Secondary | ICD-10-CM

## 2022-05-25 HISTORY — PX: BREAST BIOPSY: SHX20

## 2022-05-25 NOTE — Telephone Encounter (Signed)
LM on VM that results are back and to please call.  Left CB instructions. 

## 2022-05-25 NOTE — Progress Notes (Signed)
   Acute Office Visit  Subjective:     Patient ID: Margaret Beasley, female    DOB: Sep 01, 1955, 67 y.o.   MRN: [id number]  Chief Complaint  Patient presents with   Jaw Pain    Pt reports R sided jaw pain x 2 days hurts to chew/swallow. Unsure if it is dental pain    HPI Patient is in today for noticed a lump on her right jaw about 2 days ago.  No dental pain. Pain ful when chews or swallows.   She says for months though she has had difficulty swallowing certain foods for example if she eats a [place] fry it seems like it always gets caught on the right side of her throat and it makes it difficult to swallow.  She did see GI about it at 1 point but that is around the time she got diagnosed with rheumatoid and then breast cancer and so ended up putting it off.  He denies any fevers or chills.  She just had seed implants done earlier this morning for left breast cancer and is scheduled for surgery on Thursday.  ROS      Objective:    BP 127/75   Pulse 100   Ht 5\' 3"  (1.6 m)   Wt 232 lb (105.2 kg)   SpO2 96%   BMI 41.10 kg/m    Physical Exam Constitutional:      Appearance: Normal appearance.  HENT:     Right Ear: Tympanic membrane, ear canal and external ear normal.     Nose: Nose normal.     Mouth/Throat:     Pharynx: Oropharynx is clear.  Eyes:     Conjunctiva/sclera: Conjunctivae normal.  Neck:     Comments: + thyromegaly, right upper ant cervical mass, possible LN Musculoskeletal:     Cervical back: Neck supple. No tenderness.  Neurological:     Mental Status: She is alert.     No results found for any visits on 05/25/22.      Assessment & Plan:   Problem List Items Addressed This Visit       Digestive   Dysphagia   Relevant Orders   Ambulatory referral to Gastroenterology     Endocrine   Thyroid enlarged   Other Visit Diagnoses     Mass of right side of neck    -  Primary   Relevant Orders   US Soft Tissue Head/Neck (NON-THYROID)      Will  get her scheduled for ultrasound today or tomorrow if at all possible before surgery.  May need additional follow-up including a CT.  Will give her a call.  No fever or abnormal weight loss at this point.  She is also having difficulty swallowing so we did discuss a GI referral she will need an upper endoscopy for further evaluation.  No orders of the defined types were placed in this encounter.   No follow-ups on file.  Nani Gasser, MD

## 2022-05-26 ENCOUNTER — Ambulatory Visit: Payer: Self-pay | Admitting: Genetic Counselor

## 2022-05-26 DIAGNOSIS — Z1379 Encounter for other screening for genetic and chromosomal anomalies: Secondary | ICD-10-CM

## 2022-05-26 NOTE — Progress Notes (Signed)
Hi Jentri,  The great news is they did not see a discrete mass or swollen lymph node.  They felt like it just might be a pseudonodule.  Meaning it is tissue that is there but it is not necessarily abnormal.  They did state that we could consider a contrast-enhanced neck CT.  That would be the next step to further evaluate the area.  I would rather wait until after your surgery this week and when you feel strong enough and recover enough to do it we can always get it scheduled usually within about 5 days.  So just send me a note in a few weeks and let me know if you feel up to it and we can proceed.  I know you have a lot on your plate this week so I definitely do not want to overwhelm you.

## 2022-05-26 NOTE — Progress Notes (Signed)
Spoke with pt, she will arrive tom at 0930. Stop clear liquids at 0930.

## 2022-05-26 NOTE — Progress Notes (Signed)
HPI:  [name] was previously seen in the [facility] due to a personal and family history of cancer and concerns regarding a hereditary predisposition to cancer. Please refer to our prior cancer genetics clinic note for more information regarding our discussion, assessment and recommendations, at the time. [name]'s recent genetic test results were disclosed to her, as were recommendations warranted by these results. These results and recommendations are discussed in more detail below.  CANCER HISTORY:  Oncology History  Malignant neoplasm of upper-outer quadrant of right breast in female, estrogen receptor positive  05/21/2020 Initial Diagnosis   Screening mammogram showed a possible mass in the right breast. Diagnostic mammogram and US showed a 0.5cm mass at the 12 o'clock position in the right breast, with no right axillary abnormalities. Biopsy showed invasive and in situ ductal carcinoma, grade 1, HER-2 equivocal by IHC, negative by FISH (ratio 1.22), ER+ 95%, PR+ 90%, Ki67 10%.    05/28/2020 Cancer Staging   Staging form: Breast, AJCC 8th Edition - Clinical stage from 05/28/2020: Stage IA (cT1a, cN0, cM0, G2, ER+, PR+, HER2-) - Signed by Serena CroissantGudena, Vinay, MD on 05/28/2020 Stage prefix: Initial diagnosis Method of lymph node assessment: Clinical Histologic grading system: 3 grade system   06/04/2020 Genetic Testing   Negative hereditary cancer genetic testing: no pathogenic variants detected in [facility] Multi-Cancer Panel.  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  The report date is June 04, 2020.   The Multi-Cancer Panel offered by [facility] includes sequencing and/or deletion duplication testing of the following 84 genes: AIP, ALK, APC, ATM, AXIN2,BAP1,  BARD1, BLM, BMPR1A, BRCA1, BRCA2, BRIP1, CASR, CDC73, CDH1, CDK4, CDKN1B, CDKN1C, CDKN2A (p14ARF), CDKN2A (p16INK4a), CEBPA, CHEK2, CTNNA1, DICER1, DIS3L2, EGFR (c.2369C>T, p.Thr790Met  variant only), EPCAM (Deletion/duplication testing only), FH, FLCN, GATA2, GPC3, GREM1 (Promoter region deletion/duplication testing only), HOXB13 (c.251G>A, p.Gly84Glu), HRAS, KIT, MAX, MEN1, MET, MITF (c.952G>A, p.Glu318Lys variant only), MLH1, MSH2, MSH3, MSH6, MUTYH, NBN, NF1, NF2, NTHL1, PALB2, PDGFRA, PHOX2B, PMS2, POLD1, POLE, POT1, PRKAR1A, PTCH1, PTEN, RAD50, RAD51C, RAD51D, RB1, RECQL4, RET, RUNX1, SDHAF2, SDHA (sequence changes only), SDHB, SDHC, SDHD, SMAD4, SMARCA4, SMARCB1, SMARCE1, STK11, SUFU, TERC, TERT, TMEM127, TP53, TSC1, TSC2, VHL, WRN and WT1.    06/26/2020 Surgery   Right lumpectomy ([facility]): IDC, grade 1, 0.2cm, with intermediate grade DCIS, and 2 right axillary lymph nodes negative for carcinoma.   Malignant neoplasm of upper-inner quadrant of left breast in female, estrogen receptor positive  04/02/2022 Initial Diagnosis   Suspicious 0.9 cm mass left breast 10 o'clock position, indeterminate 1.5 cm group of calcifications UIQ left breast: Mass and calcifications span 3.7 cm.  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  Significant diagnoses are listed below: Family History  Problem Relation Age of Onset   Breast cancer Mother 5124       two primaries (dx 3324, 740s)   Depression Father    Multiple myeloma Maternal Grandmother        dx 3960s   Throat cancer Maternal Grandfather        nose cancer; dx before 4440   Leukemia Maternal Uncle        chronic; dx late 9270s       The patient has one son who is  cancer free.  She has a maternal half brother who is cancer free.  Her father is decease and her mother is living.   The patient's father is deceased at 83. The patient has no information on his family.   The patient's mother is living at 77.  She had breast cancer at 24 and  again at 84.  She has two full brothers and a maternal half brother. One full brother was diagnosed with CLL at 67.  Her maternal grandmother had myeloma.   [name] is unaware of previous family history of genetic testing for hereditary cancer risks. There is no reported Ashkenazi Jewish ancestry. There is no known consanguinity  GENETIC TEST RESULTS: Genetic testing reported out on May 24, 2022 through the CancerNext-Expanded+RNAinsight cancer panel found no pathogenic mutations. The CancerNext-Expanded gene panel offered by [facility] and includes sequencing and rearrangement analysis for the following 77 genes: AIP, ALK, APC*, ATM*, AXIN2, BAP1, BARD1, BMPR1A, BRCA1*, BRCA2*, BRIP1*, CDC73, CDH1*, CDK4, CDKN1B, CDKN2A, CHEK2*, CTNNA1, DICER1, FH, FLCN, KIF1B, LZTR1, MAX, MEN1, MET, MLH1*, MSH2*, MSH3, MSH6*, MUTYH*, NF1*, NF2, NTHL1, PALB2*, PHOX2B, PMS2*, POT1, PRKAR1A, PTCH1, PTEN*, RAD51C*, RAD51D*, RB1, RET, SDHA, SDHAF2, SDHB, SDHC, SDHD, SMAD4, SMARCA4, SMARCB1, SMARCE1, STK11, SUFU, TMEM127, TP53*, TSC1, TSC2, and VHL (sequencing and deletion/duplication); EGFR, EGLN1, HOXB13, KIT, MITF, PDGFRA, POLD1, and POLE (sequencing only); EPCAM and GREM1 (deletion/duplication only). DNA and RNA analyses performed for * genes. The test report has been scanned into EPIC and is located under the Molecular Pathology section of the Results Review tab.  A portion of the result report is included below for reference.     We discussed with [name] that because current genetic testing is not perfect, it is possible there may be a gene mutation in one of these genes that current testing cannot detect, but that chance is small.  We also discussed, that there could be another gene that has not yet been discovered, or that we have not yet tested, that is responsible for the cancer diagnoses in the family. It is also possible there is a hereditary cause for the cancer in the family that [name] did not inherit  and therefore was not identified in her testing.  Therefore, it is important to remain in touch with cancer genetics in the future so that we can continue to offer [name] the most up to date genetic testing.   Genetic testing did identify a variant of uncertain significance (VUS) was identified in the KIT gene called c.454C>G.  At this time, it is unknown if this variant is associated with increased cancer risk or if this is a normal finding, but most variants such as this get reclassified to being inconsequential. It should not be used to make medical management decisions. With time, we suspect the lab will determine the significance of this variant, if any. If we do learn more about it, we will try to contact [name] to discuss it further. However, it is important to stay in touch with [place] periodically and keep the address and phone number up to date.  ADDITIONAL GENETIC TESTING: We discussed with [name] that her genetic testing was fairly extensive.  If there are genes identified to increase cancer risk that can be analyzed in the future, we would be happy to discuss and coordinate this testing at that time.    CANCER SCREENING RECOMMENDATIONS: [name] test result is considered negative (normal).  This means that we have not identified a  hereditary cause for her personal and family history of cancer at this time. Most cancers happen by chance and this negative test suggests that her cancer may fall into this category.    While reassuring, this does not definitively rule out a hereditary predisposition to cancer. It is still possible that there could be genetic mutations that are undetectable by current technology. There could be genetic mutations in genes that have not been tested or identified to increase cancer risk.  Therefore, it is recommended she continue to follow the cancer management and screening guidelines provided by her oncology and primary healthcare provider.   An individual's  cancer risk and medical management are not determined by genetic test results alone. Overall cancer risk assessment incorporates additional factors, including personal medical history, family history, and any available genetic information that may result in a personalized plan for cancer prevention and surveillance   RECOMMENDATIONS FOR FAMILY MEMBERS:  Individuals in this family might be at some increased risk of developing cancer, over the general population risk, simply due to the family history of cancer.  We recommended women in this family have a yearly mammogram beginning at age 40, or 53 years younger than the earliest onset of cancer, an annual clinical breast exam, and perform monthly breast self-exams. Women in this family should also have a gynecological exam as recommended by their primary provider. All family members should be referred for colonoscopy starting at age 79.  FOLLOW-UP: Lastly, we discussed with [name] that cancer genetics is a rapidly advancing field and it is possible that new genetic tests will be appropriate for her and/or her family members in the future. We encouraged her to remain in contact with cancer genetics on an annual basis so we can update her personal and family histories and let her know of advances in cancer genetics that may benefit this family.   Our contact number was provided. [name] questions were answered to her satisfaction, and she knows she is welcome to call us at anytime with additional questions or concerns.   Maylon Cos, MS, [facility] Licensed, Certified Genetic Counselor [email or web address]

## 2022-05-26 NOTE — Telephone Encounter (Signed)
Revealed negative genetic testing.  Discussed that we do not know why she has breast cancer or why there is cancer in the family. It could be due to a different gene that we are not testing, or maybe our current technology may not be able to pick something up.  It will be important for her to keep in contact with genetics to keep up with whether additional testing may be needed.   One VUS in KIT was identified.  Medical management changes are not necessary.

## 2022-05-27 ENCOUNTER — Ambulatory Visit
Admission: RE | Admit: 2022-05-27 | Discharge: 2022-05-27 | Disposition: A | Discharge status: Home / Self Care | Payer: 59 | Source: Ambulatory Visit | Attending: Surgery | Admitting: Surgery

## 2022-05-27 ENCOUNTER — Other Ambulatory Visit: Payer: Self-pay

## 2022-05-27 ENCOUNTER — Encounter (HOSPITAL_COMMUNITY): Payer: Self-pay | Admitting: Surgery

## 2022-05-27 ENCOUNTER — Ambulatory Visit (HOSPITAL_COMMUNITY): Payer: 59 | Admitting: Vascular Surgery

## 2022-05-27 ENCOUNTER — Encounter (HOSPITAL_COMMUNITY)
Admission: RE | Disposition: A | Discharge status: Home / Self Care | Payer: Self-pay | Source: Home / Self Care | Attending: Surgery

## 2022-05-27 ENCOUNTER — Ambulatory Visit (HOSPITAL_BASED_OUTPATIENT_CLINIC_OR_DEPARTMENT_OTHER): Payer: 59 | Admitting: Anesthesiology

## 2022-05-27 ENCOUNTER — Ambulatory Visit (HOSPITAL_COMMUNITY)
Admission: RE | Admit: 2022-05-27 | Discharge: 2022-05-27 | Disposition: A | Discharge status: Home / Self Care | Payer: 59 | Attending: Surgery | Admitting: Surgery

## 2022-05-27 DIAGNOSIS — C50912 Malignant neoplasm of unspecified site of left female breast: Secondary | ICD-10-CM

## 2022-05-27 DIAGNOSIS — Z79899 Other long term (current) drug therapy: Secondary | ICD-10-CM | POA: Diagnosis not present

## 2022-05-27 DIAGNOSIS — Z923 Personal history of irradiation: Secondary | ICD-10-CM | POA: Diagnosis not present

## 2022-05-27 DIAGNOSIS — F32A Depression, unspecified: Secondary | ICD-10-CM | POA: Diagnosis not present

## 2022-05-27 DIAGNOSIS — G4733 Obstructive sleep apnea (adult) (pediatric): Secondary | ICD-10-CM

## 2022-05-27 DIAGNOSIS — E039 Hypothyroidism, unspecified: Secondary | ICD-10-CM | POA: Insufficient documentation

## 2022-05-27 DIAGNOSIS — R569 Unspecified convulsions: Secondary | ICD-10-CM | POA: Diagnosis not present

## 2022-05-27 DIAGNOSIS — Z6841 Body Mass Index (BMI) 40.0 and over, adult: Secondary | ICD-10-CM | POA: Insufficient documentation

## 2022-05-27 DIAGNOSIS — F1120 Opioid dependence, uncomplicated: Secondary | ICD-10-CM | POA: Diagnosis not present

## 2022-05-27 DIAGNOSIS — F419 Anxiety disorder, unspecified: Secondary | ICD-10-CM | POA: Diagnosis not present

## 2022-05-27 DIAGNOSIS — R519 Headache, unspecified: Secondary | ICD-10-CM | POA: Diagnosis not present

## 2022-05-27 DIAGNOSIS — Z170 Estrogen receptor positive status [ER+]: Secondary | ICD-10-CM | POA: Diagnosis not present

## 2022-05-27 DIAGNOSIS — C50212 Malignant neoplasm of upper-inner quadrant of left female breast: Secondary | ICD-10-CM | POA: Diagnosis present

## 2022-05-27 DIAGNOSIS — G4730 Sleep apnea, unspecified: Secondary | ICD-10-CM | POA: Diagnosis not present

## 2022-05-27 DIAGNOSIS — I10 Essential (primary) hypertension: Secondary | ICD-10-CM

## 2022-05-27 DIAGNOSIS — Z853 Personal history of malignant neoplasm of breast: Secondary | ICD-10-CM | POA: Diagnosis not present

## 2022-05-27 DIAGNOSIS — M1990 Unspecified osteoarthritis, unspecified site: Secondary | ICD-10-CM | POA: Insufficient documentation

## 2022-05-27 DIAGNOSIS — Z803 Family history of malignant neoplasm of breast: Secondary | ICD-10-CM | POA: Diagnosis not present

## 2022-05-27 DIAGNOSIS — M797 Fibromyalgia: Secondary | ICD-10-CM | POA: Diagnosis not present

## 2022-05-27 DIAGNOSIS — Z91199 Patient's noncompliance with other medical treatment and regimen due to unspecified reason: Secondary | ICD-10-CM | POA: Diagnosis not present

## 2022-05-27 DIAGNOSIS — Z9989 Dependence on other enabling machines and devices: Secondary | ICD-10-CM

## 2022-05-27 DIAGNOSIS — E785 Hyperlipidemia, unspecified: Secondary | ICD-10-CM | POA: Insufficient documentation

## 2022-05-27 HISTORY — PX: BREAST LUMPECTOMY WITH RADIOACTIVE SEED AND SENTINEL LYMPH NODE BIOPSY: SHX6550

## 2022-05-27 SURGERY — BREAST LUMPECTOMY WITH RADIOACTIVE SEED AND SENTINEL LYMPH NODE BIOPSY
Anesthesia: General | Site: Breast | Laterality: Left

## 2022-05-27 MED ORDER — SCOPOLAMINE 1 MG/3DAYS TD PT72
1.0000 | MEDICATED_PATCH | TRANSDERMAL | Status: DC
  Filled 2022-05-27: qty 1

## 2022-05-27 MED ORDER — ACETAMINOPHEN 500 MG PO TABS: 1000.0000 mg | ORAL_TABLET | Freq: Four times a day (QID) | ORAL | Status: DC | PRN

## 2022-05-27 MED ORDER — ACETAMINOPHEN 500 MG PO TABS
ORAL_TABLET | ORAL | Status: AC
  Administered 2022-05-27: 1000 mg via ORAL
  Filled 2022-05-27: qty 2

## 2022-05-27 MED ORDER — MIDAZOLAM HCL 2 MG/2ML IJ SOLN
INTRAMUSCULAR | Status: AC
  Filled 2022-05-27: qty 2

## 2022-05-27 MED ORDER — BUPIVACAINE HCL (PF) 0.5 % IJ SOLN
INTRAMUSCULAR | Status: DC | PRN
  Administered 2022-05-27: 20 mL via PERINEURAL

## 2022-05-27 MED ORDER — HYDROCODONE-ACETAMINOPHEN 5-325 MG PO TABS
1.0000 | ORAL_TABLET | Freq: Four times a day (QID) | ORAL | 0 refills | Status: DC | PRN
Start: 2022-05-27 — End: 2022-08-27

## 2022-05-27 MED ORDER — ALBUTEROL SULFATE HFA 108 (90 BASE) MCG/ACT IN AERS
INHALATION_SPRAY | RESPIRATORY_TRACT | Status: DC | PRN
  Administered 2022-05-27 (×2): 2 via RESPIRATORY_TRACT

## 2022-05-27 MED ORDER — LACTATED RINGERS IV SOLN: INTRAVENOUS | Status: DC

## 2022-05-27 MED ORDER — MIDAZOLAM HCL 2 MG/2ML IJ SOLN
INTRAMUSCULAR | Status: AC
  Administered 2022-05-27: 1 mg via INTRAVENOUS
  Filled 2022-05-27: qty 2

## 2022-05-27 MED ORDER — BUPIVACAINE LIPOSOME 1.3 % IJ SUSP
INTRAMUSCULAR | Status: DC | PRN
  Administered 2022-05-27: 10 mL via PERINEURAL

## 2022-05-27 MED ORDER — BUPIVACAINE-EPINEPHRINE (PF) 0.5% -1:200000 IJ SOLN
INTRAMUSCULAR | Status: DC | PRN
  Administered 2022-05-27: 25 mL via PERINEURAL

## 2022-05-27 MED ORDER — ONDANSETRON HCL 4 MG/2ML IJ SOLN: 4.0000 mg | Freq: Once | INTRAMUSCULAR | Status: DC | PRN

## 2022-05-27 MED ORDER — CHLORHEXIDINE GLUCONATE CLOTH 2 % EX PADS: 6.0000 | MEDICATED_PAD | Freq: Once | CUTANEOUS | Status: DC

## 2022-05-27 MED ORDER — MAGTRACE LYMPHATIC TRACER
INTRAMUSCULAR | Status: DC | PRN
  Administered 2022-05-27: 1 mL via INTRAMUSCULAR

## 2022-05-27 MED ORDER — FENTANYL CITRATE (PF) 100 MCG/2ML IJ SOLN: 50.0000 ug | Freq: Once | INTRAMUSCULAR | Status: AC

## 2022-05-27 MED ORDER — LIDOCAINE 2% (20 MG/ML) 5 ML SYRINGE
INTRAMUSCULAR | Status: DC | PRN
  Administered 2022-05-27: 60 mg via INTRAVENOUS

## 2022-05-27 MED ORDER — FENTANYL CITRATE (PF) 250 MCG/5ML IJ SOLN
INTRAMUSCULAR | Status: AC
  Filled 2022-05-27: qty 5

## 2022-05-27 MED ORDER — ORAL CARE MOUTH RINSE: 15.0000 mL | Freq: Once | OROMUCOSAL | Status: AC

## 2022-05-27 MED ORDER — ONDANSETRON HCL 4 MG/2ML IJ SOLN
INTRAMUSCULAR | Status: DC | PRN
  Administered 2022-05-27: 4 mg via INTRAVENOUS

## 2022-05-27 MED ORDER — PROPOFOL 10 MG/ML IV BOLUS
INTRAVENOUS | Status: DC | PRN
  Administered 2022-05-27: 70 mg via INTRAVENOUS
  Administered 2022-05-27: 130 mg via INTRAVENOUS

## 2022-05-27 MED ORDER — 0.9 % SODIUM CHLORIDE (POUR BTL) OPTIME
TOPICAL | Status: DC | PRN
  Administered 2022-05-27: 1000 mL

## 2022-05-27 MED ORDER — AMISULPRIDE (ANTIEMETIC) 5 MG/2ML IV SOLN: 10.0000 mg | Freq: Once | INTRAVENOUS | Status: DC | PRN

## 2022-05-27 MED ORDER — MIDAZOLAM HCL 2 MG/2ML IJ SOLN: 1.0000 mg | Freq: Once | INTRAMUSCULAR | Status: AC

## 2022-05-27 MED ORDER — MIDAZOLAM HCL 2 MG/2ML IJ SOLN
INTRAMUSCULAR | Status: DC | PRN
  Administered 2022-05-27: 2 mg via INTRAVENOUS

## 2022-05-27 MED ORDER — OXYCODONE HCL 5 MG/5ML PO SOLN
5.0000 mg | ORAL | Status: DC | PRN
  Administered 2022-05-27: 5 mg via ORAL

## 2022-05-27 MED ORDER — FENTANYL CITRATE (PF) 250 MCG/5ML IJ SOLN
INTRAMUSCULAR | Status: DC | PRN
  Administered 2022-05-27: 25 ug via INTRAVENOUS
  Administered 2022-05-27: 100 ug via INTRAVENOUS
  Administered 2022-05-27: 25 ug via INTRAVENOUS

## 2022-05-27 MED ORDER — CHLORHEXIDINE GLUCONATE 0.12 % MT SOLN
15.0000 mL | Freq: Once | OROMUCOSAL | Status: AC
  Administered 2022-05-27: 15 mL via OROMUCOSAL
  Filled 2022-05-27: qty 15

## 2022-05-27 MED ORDER — SCOPOLAMINE 1 MG/3DAYS TD PT72
1.0000 | MEDICATED_PATCH | TRANSDERMAL | Status: DC
  Administered 2022-05-27: 1.5 mg via TRANSDERMAL

## 2022-05-27 MED ORDER — DEXAMETHASONE SODIUM PHOSPHATE 10 MG/ML IJ SOLN
INTRAMUSCULAR | Status: DC | PRN
  Administered 2022-05-27: 5 mg via INTRAVENOUS

## 2022-05-27 MED ORDER — PHENYLEPHRINE 80 MCG/ML (10ML) SYRINGE FOR IV PUSH (FOR BLOOD PRESSURE SUPPORT)
PREFILLED_SYRINGE | INTRAVENOUS | Status: DC | PRN
  Administered 2022-05-27 (×2): 160 ug via INTRAVENOUS

## 2022-05-27 MED ORDER — FENTANYL CITRATE (PF) 100 MCG/2ML IJ SOLN: 25.0000 ug | INTRAMUSCULAR | Status: DC | PRN

## 2022-05-27 MED ORDER — OXYCODONE HCL 5 MG/5ML PO SOLN
ORAL | Status: AC
  Filled 2022-05-27: qty 5

## 2022-05-27 MED ORDER — CLINDAMYCIN PHOSPHATE 900 MG/50ML IV SOLN
900.0000 mg | INTRAVENOUS | Status: AC
  Administered 2022-05-27: 900 mg via INTRAVENOUS
  Filled 2022-05-27: qty 50

## 2022-05-27 MED ORDER — FENTANYL CITRATE (PF) 100 MCG/2ML IJ SOLN
INTRAMUSCULAR | Status: AC
  Administered 2022-05-27: 50 ug via INTRAVENOUS
  Filled 2022-05-27: qty 2

## 2022-05-27 MED ORDER — PHENYLEPHRINE HCL-NACL 20-0.9 MG/250ML-% IV SOLN
INTRAVENOUS | Status: DC | PRN
  Administered 2022-05-27: 20 ug/min via INTRAVENOUS

## 2022-05-27 MED ORDER — PROPOFOL 500 MG/50ML IV EMUL
INTRAVENOUS | Status: DC | PRN
  Administered 2022-05-27: 20 ug/kg/min via INTRAVENOUS

## 2022-05-27 SURGICAL SUPPLY — 49 items
ADH SKN CLS APL DERMABOND .7 (GAUZE/BANDAGES/DRESSINGS) ×1
ADH SKN CLS LQ APL DERMABOND (GAUZE/BANDAGES/DRESSINGS) ×1
APL PRP STRL LF DISP 70% ISPRP (MISCELLANEOUS) ×1
APPLIER CLIP 9.375 MED OPEN (MISCELLANEOUS) ×1
APR CLP MED 9.3 20 MLT OPN (MISCELLANEOUS) ×1
BAG COUNTER SPONGE SURGICOUNT (BAG) IMPLANT
BAG SPNG CNTER NS LX DISP (BAG)
BINDER BREAST LRG (GAUZE/BANDAGES/DRESSINGS) IMPLANT
BINDER BREAST XLRG (GAUZE/BANDAGES/DRESSINGS) IMPLANT
BINDER BREAST XXLRG (GAUZE/BANDAGES/DRESSINGS) IMPLANT
CANISTER SUCT 3000ML PPV (MISCELLANEOUS) ×1 IMPLANT
CHLORAPREP W/TINT 26 (MISCELLANEOUS) ×1 IMPLANT
CLIP APPLIE 9.375 MED OPEN (MISCELLANEOUS) ×1 IMPLANT
CNTNR URN SCR LID CUP LEK RST (MISCELLANEOUS) IMPLANT
CONT SPEC 4OZ STRL OR WHT (MISCELLANEOUS)
COVER PROBE W GEL 5X96 (DRAPES) ×1 IMPLANT
COVER SURGICAL LIGHT HANDLE (MISCELLANEOUS) ×1 IMPLANT
DERMABOND ADVANCED .7 DNX12 (GAUZE/BANDAGES/DRESSINGS) ×1 IMPLANT
DERMABOND ADVANCED .7 DNX6 (GAUZE/BANDAGES/DRESSINGS) IMPLANT
DEVICE DUBIN SPECIMEN MAMMOGRA (MISCELLANEOUS) ×1 IMPLANT
DRAPE CHEST BREAST 15X10 FENES (DRAPES) ×1 IMPLANT
ELECT CAUTERY BLADE 6.4 (BLADE) ×1 IMPLANT
ELECT REM PT RETURN 9FT ADLT (ELECTROSURGICAL) ×1
ELECTRODE REM PT RTRN 9FT ADLT (ELECTROSURGICAL) ×1 IMPLANT
GAUZE PAD ABD 8X10 STRL (GAUZE/BANDAGES/DRESSINGS) ×1 IMPLANT
GLOVE BIO SURGEON STRL SZ8 (GLOVE) ×1 IMPLANT
GLOVE BIOGEL PI IND STRL 8 (GLOVE) ×1 IMPLANT
GOWN STRL REUS W/ TWL LRG LVL3 (GOWN DISPOSABLE) ×1 IMPLANT
GOWN STRL REUS W/ TWL XL LVL3 (GOWN DISPOSABLE) ×1 IMPLANT
GOWN STRL REUS W/TWL LRG LVL3 (GOWN DISPOSABLE) ×1
GOWN STRL REUS W/TWL XL LVL3 (GOWN DISPOSABLE) ×1
HEMOSTAT ARISTA ABSORB 3G PWDR (HEMOSTASIS) IMPLANT
KIT BASIN OR (CUSTOM PROCEDURE TRAY) ×1 IMPLANT
KIT MARKER MARGIN INK (KITS) ×1 IMPLANT
LIGHT WAVEGUIDE WIDE FLAT (MISCELLANEOUS) IMPLANT
NDL 18GX1X1/2 (RX/OR ONLY) (NEEDLE) IMPLANT
NDL FILTER BLUNT 18X1 1/2 (NEEDLE) IMPLANT
NDL HYPO 25GX1X1/2 BEV (NEEDLE) ×1 IMPLANT
NEEDLE 18GX1X1/2 (RX/OR ONLY) (NEEDLE) IMPLANT
NEEDLE FILTER BLUNT 18X1 1/2 (NEEDLE) IMPLANT
NEEDLE HYPO 25GX1X1/2 BEV (NEEDLE) ×1 IMPLANT
NS IRRIG 1000ML POUR BTL (IV SOLUTION) ×1 IMPLANT
PACK GENERAL/GYN (CUSTOM PROCEDURE TRAY) ×1 IMPLANT
SUT MNCRL AB 4-0 PS2 18 (SUTURE) ×1 IMPLANT
SUT VIC AB 3-0 SH 18 (SUTURE) ×1 IMPLANT
SYR 3ML LL SCALE MARK (SYRINGE) IMPLANT
SYR CONTROL 10ML LL (SYRINGE) ×1 IMPLANT
TOWEL GREEN STERILE (TOWEL DISPOSABLE) ×1 IMPLANT
TOWEL GREEN STERILE FF (TOWEL DISPOSABLE) ×1 IMPLANT

## 2022-05-27 NOTE — Discharge Instructions (Signed)
[facility] Office Phone Number [phone]  BREAST BIOPSY/ PARTIAL MASTECTOMY: POST OP INSTRUCTIONS  Always review your discharge instruction sheet given to you by the facility where your surgery was performed.  IF YOU HAVE DISABILITY OR FAMILY LEAVE FORMS, YOU MUST BRING THEM TO THE OFFICE FOR PROCESSING.  DO NOT GIVE THEM TO YOUR DOCTOR.  A prescription for pain medication may be given to you upon discharge.  Take your pain medication as prescribed, if needed.  If narcotic pain medicine is not needed, then you may take acetaminophen (Tylenol) or ibuprofen (Advil) as needed. Take your usually prescribed medications unless otherwise directed If you need a refill on your pain medication, please contact your pharmacy.  They will contact our office to request authorization.  Prescriptions will not be filled after 5pm or on week-ends. You should eat very light the first 24 hours after surgery, such as soup, crackers, pudding, etc.  Resume your normal diet the day after surgery. Most patients will experience some swelling and bruising in the breast.  Ice packs and a good support bra will help.  Swelling and bruising can take several days to resolve.  It is common to experience some constipation if taking pain medication after surgery.  Increasing fluid intake and taking a stool softener will usually help or prevent this problem from occurring.  A mild laxative (Milk of Magnesia or Miralax) should be taken according to package directions if there are no bowel movements after 48 hours. Unless discharge instructions indicate otherwise, you may remove your bandages 24-48 hours after surgery, and you may shower at that time.  You may have steri-strips (small skin tapes) in place directly over the incision.  These strips should be left on the skin for 7-10 days.  If your surgeon used skin glue on the incision, you may shower in 24 hours.  The glue will flake off over the next 2-3 weeks.  Any  sutures or staples will be removed at the office during your follow-up visit. ACTIVITIES:  You may resume regular daily activities (gradually increasing) beginning the next day.  Wearing a good support bra or sports bra minimizes pain and swelling.  You may have sexual intercourse when it is comfortable. You may drive when you no longer are taking prescription pain medication, you can comfortably wear a seatbelt, and you can safely maneuver your car and apply brakes. RETURN TO WORK:  ______________________________________________________________________________________ You should see your doctor in the office for a follow-up appointment approximately two weeks after your surgery.  Your doctor's nurse will typically make your follow-up appointment when she calls you with your pathology report.  Expect your pathology report 2-3 business days after your surgery.  You may call to check if you do not hear from us after three days. OTHER INSTRUCTIONS: _______________________________________________________________________________________________ _____________________________________________________________________________________________________________________________________ _____________________________________________________________________________________________________________________________________ _____________________________________________________________________________________________________________________________________  WHEN TO CALL YOUR DOCTOR: Fever over 101.0 Nausea and/or vomiting. Extreme swelling or bruising. Continued bleeding from incision. Increased pain, redness, or drainage from the incision.  The clinic staff is available to answer your questions during regular business hours.  Please don't hesitate to call and ask to speak to one of the nurses for clinical concerns.  If you have a medical emergency, go to the nearest emergency room or call 911.  A surgeon from [facility] is always on call at the hospital.  For further questions, please visit [email or web address]   

## 2022-05-27 NOTE — Anesthesia Postprocedure Evaluation (Signed)
Anesthesia Post Note  Patient: Margaret Beasley  Procedure(s) Performed: LEFT BREAST BRACKETED LUMPECTOMY WITH RADIOACTIVE SEED AND SENTINEL LYMPH NODE BIOPSY (Left: Breast)     Patient location during evaluation: PACU Anesthesia Type: General Level of consciousness: awake and alert and oriented Pain management: pain level controlled Vital Signs Assessment: post-procedure vital signs reviewed and stable Respiratory status: spontaneous breathing, nonlabored ventilation and respiratory function stable Cardiovascular status: blood pressure returned to baseline and stable Postop Assessment: no apparent nausea or vomiting Anesthetic complications: no   No notable events documented.  Last Vitals:  Vitals:   05/27/22 1345 05/27/22 1400  BP: 132/68 128/80  Pulse: (!) 105 93  Resp: 11 12  Temp:    SpO2: 99% 100%    Last Pain:  Vitals:   05/27/22 1400  PainSc: 0-No pain                 Anmarie Fukushima A.

## 2022-05-27 NOTE — Anesthesia Procedure Notes (Signed)
Anesthesia Regional Block: Pectoralis block   Pre-Anesthetic Checklist: , timeout performed,  Correct Patient, Correct Site, Correct Laterality,  Correct Procedure, Correct Position, site marked,  Risks and benefits discussed,  Surgical consent,  Pre-op evaluation,  At surgeon's request and post-op pain management  Laterality: Left  Prep: chloraprep       Needles:  Injection technique: Single-shot  Needle Type: Echogenic Stimulator Needle     Needle Length: 10cm  Needle Gauge: 21   Needle insertion depth: 8 cm   Additional Needles:   Procedures:,,,, ultrasound used (permanent image in chart),,    Narrative:  Start time: 05/27/2022 10:40 AM End time: 05/27/2022 10:45 AM Injection made incrementally with aspirations every 5 mL.  Performed by: Personally  Anesthesiologist: Mal Amabile, MD  Additional Notes: Timeout performed. Patient sedated. Relevant anatomy ID'd using [place]. Incremental 2-1ml injection of LA with frequent aspiration. Patient tolerated procedure well.

## 2022-05-27 NOTE — H&P (Signed)
Chief Complaint: New Consultation (Left Breast Cancer)  History of Present Illness: Margaret Beasley is a 67 y.o. female who is seen today as an office consultation for evaluation of New Consultation (Left Breast Cancer)  Patient presents for evaluation of newly diagnosed left breast cancer. She has a history in 2022 of right breast cancer. This was treated with breast conserving surgery at that time and radiation therapy. 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 Complains of right breast pain now and left breast soreness from biopsy. Patient cannot take antiestrogen therapy due to intolerance.  Oncology History Malignant neoplasm of upper-outer quadrant of right breast in female, estrogen receptor positive (HCC) 05/21/2020 Initial Diagnosis Screening mammogram showed a possible mass in the right breast. Diagnostic mammogram and US showed a 0.5cm mass at the 12 o'clock position in the right breast, with no right axillary abnormalities. Biopsy showed invasive and in situ ductal carcinoma, grade 1, HER-2 equivocal by IHC, negative by FISH (ratio 1.22), ER+ 95%, PR+ 90%, Ki67 10%.  05/28/2020 Cancer Staging Staging form: Breast, AJCC 8th Edition - Clinical stage from 05/28/2020: Stage IA (cT1a, cN0, cM0, G2, ER+, PR+, HER2-) - Signed by [name], MD on 05/28/2020 Stage prefix: Initial diagnosis Method of lymph node assessment: Clinical Histologic grading system: 3 grade system  06/04/2020 Genetic Testing Negative hereditary cancer genetic testing: no pathogenic variants detected in [facility] Multi-Cancer Panel. 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 The report date is June 04, 2020.  The Multi-Cancer Panel offered by [facility] includes sequencing and/or deletion duplication testing of the following 84 genes: AIP, ALK, APC, ATM, AXIN2,BAP1, BARD1, BLM, BMPR1A, BRCA1, BRCA2, BRIP1, CASR, CDC73, CDH1, CDK4,  CDKN1B, CDKN1C, CDKN2A (p14ARF), CDKN2A (p16INK4a), CEBPA, CHEK2, CTNNA1, DICER1, DIS3L2, EGFR (c.2369C>T, p.Thr790Met variant only), EPCAM (Deletion/duplication testing only), FH, FLCN, GATA2, GPC3, GREM1 (Promoter region deletion/duplication testing only), HOXB13 (c.251G>A, p.Gly84Glu), HRAS, KIT, MAX, MEN1, MET, MITF (c.952G>A, p.Glu318Lys variant only), MLH1, MSH2, MSH3, MSH6, MUTYH, NBN, NF1, NF2, NTHL1, PALB2, PDGFRA, PHOX2B, PMS2, POLD1, POLE, POT1, PRKAR1A, PTCH1, PTEN, RAD50, RAD51C, RAD51D, RB1, RECQL4, RET, RUNX1, SDHAF2, SDHA (sequence changes only), SDHB, SDHC, SDHD, SMAD4, SMARCA4, SMARCB1, SMARCE1, STK11, SUFU, TERC, TERT, TMEM127, TP53, TSC1, TSC2, VHL, WRN and WT1.  06/26/2020 Surgery Right lumpectomy (Sofi Bryars): IDC, grade 1, 0.2cm, with intermediate grade DCIS, and 2 right axillary lymph nodes negative for carcinoma.    Review of Systems: A complete review of systems was obtained from the patient. I have reviewed this information and discussed as appropriate with the patient. See HPI as well for other ROS.    Medical History: Past Medical History: Diagnosis Date Arthritis Asthma, unspecified asthma severity, unspecified whether complicated, unspecified whether persistent Chronic kidney disease History of cancer Hyperlipidemia Seizures (CMS-HCC) Sleep apnea Thyroid disease  There is no problem list on file for this patient.  Past Surgical History: Procedure Laterality Date APPENDECTOMY CESAREAN SECTION CHOLECYSTECTOMY   Allergies Allergen Reactions Topiramate Other (See Comments) Other reaction(s): Seizures Anastrozole Nausea And Vomiting Butorphanol Other (See Comments) Other reaction(s): Unknown  Other reaction(s): Unknown Other reaction(s): Unknown Celecoxib Other (See Comments) Stomach aches  Other reaction(s): Other Stomach aches Diphenhydramine Hcl Other (See Comments) Seizure. Latex Other (See Comments) Latex gloves  Other reaction(s):  Other Latex gloves Penicillins Other (See Comments) Other reaction(s): Unknown Childhood reaction  Childhood reaction  Other reaction(s): Unknown Childhood reaction Other reaction(s): Unknown Childhood reaction Ropinirole Other (See Comments) Headache, vivid dreams Diphenhydramine-Acetaminophen Other (See Comments)  Patient states she is allergic to all opioids except Vicodin Gabapentin Nausea and Other (See Comments) Other reaction(s): Other (See Comments) Vocal spasms and seizure. Ended up in ED after 3 days on the medication.  seizure. Ended up in ED after 3 days on the medication. Other reaction(s): Other (See Comments) Other reaction(s): Other (See Comments) Vocal spasms and seizure. Ended up in ED after 3 days on the medication. Other reaction(s): Other (See Comments) Vocal spasms and seizure. Ended up in ED after 3 days on the medication. Oxycodone-Acetaminophen Headache And most opioids  Current Outpatient Medications on File Prior to Visit Medication Sig Dispense Refill anastrozole (ARIMIDEX) 1 mg tablet Take by mouth DULoxetine (CYMBALTA) 30 MG DR capsule 1 capsule fluorouraciL (EFUDEX) 5 % cream Apply topically folic acid (FOLVITE) 1 MG tablet Take 1 tablet by mouth once daily HYDROcodone-acetaminophen (NORCO) 5-325 mg tablet Take by mouth lamoTRIgine (LAMICTAL) 100 MG tablet 1 tablet levETIRAcetam (KEPPRA) 500 MG tablet 1 tablet levothyroxine (SYNTHROID) 50 MCG tablet Take 1 tablet by mouth every morning before breakfast (0630) methotrexate (RHEUMATREX) 2.5 MG tablet Take by mouth promethazine (PHENERGAN) 25 MG suppository Place rectally simvastatin (ZOCOR) 10 MG tablet Take 10 mg by mouth at bedtime SUMAtriptan (IMITREX) 100 MG tablet TAKE 1 TABLET (100 MG TOTAL) BY MOUTH EVERY 2 (TWO) HOURS AS NEEDED FOR MIGRAINE.  No current facility-administered medications on file prior to visit.  Family History Problem Relation Age of Onset Skin cancer Mother Coronary  Artery Disease (Blocked arteries around heart) Mother Breast cancer Mother   Social History  Tobacco Use Smoking Status Never Smokeless Tobacco Never   Social History  Socioeconomic History Marital status: Married Tobacco Use Smoking status: Never Smokeless tobacco: Never Vaping Use Vaping Use: Never used Substance and Sexual Activity Alcohol use: Not Currently Drug use: Never  Objective:  Vitals: 04/09/22 1007 BP: 118/76 Pulse: 80 Temp: 36.6 C (97.9 F) SpO2: 97% Weight: (!) 107.4 kg (236 lb 12.8 oz) Height: 160 cm (5\' 3" )  Body mass index is 41.95 kg/m.  Physical Exam HENT: Head: Normocephalic. Cardiovascular: Rate and Rhythm: Normal rate. Pulmonary: Effort: Pulmonary effort is normal. Chest:  Comments: Scars noted both breast. There is more fullness redness the right breast. No evidence of mass lesion. Bruising on left. Noted postradiation changes noted bilaterally. Lymphadenopathy: Upper Body: Right upper body: No supraclavicular or axillary adenopathy. Left upper body: No supraclavicular or axillary adenopathy. Skin: General: Skin is warm. Neurological: General: No focal deficit present. Mental Status: She is alert. Psychiatric: Mood and Affect: Mood normal.    Labs, Imaging and Diagnostic Testing: Diagnosis 1. Breast, left, needle core biopsy, 10 o'clock, 3 cmfn, heart clip INVASIVE MAMMARY CARCINOMA, SEE NOTE MAMMARY CARCINOMA IN SITU, SOLID, LOW NUCLEAR GRADE TUBULE FORMATION: SCORE 3 NUCLEAR PLEOMORPHISM: SCORE 1 MITOTIC COUNT: SCORE 1 TOTAL SCORE: 5 OVERALL GRADE: 1 LYMPHOVASCULAR INVASION: NOT IDENTIFIED CANCER LENGTH: 0.8 CM CALCIFICATIONS: PRESENT OTHER FINDINGS: SEE NOTE 2. Breast, left, needle core biopsy, UIQ, X clip DUCTAL CARCINOMA IN SITU, CRIBRIFORM, LOW GRADE NECROSIS: NOT IDENTIFIED CALCIFICATIONS: PRESENT DCIS LENGTH: 0.6 CM SEE NOTE Diagnosis Note 1. Dr. Corey Harold reviewed the case and concurs with the  interpretation. E-cadherin and breast prognostic profile (ER, PR, Ki-67 and HER2) is pending and will be reported in an addendum. [facility] was notified on 04/05/2022. Addendum: Immunohistochemistry performed on the left breast core at 10:00 (part 1) is negative in the invasive and in situ carcinoma consistent with lobular phenotype. 1 of 4 Amended copy Addendum FINAL  for Barnett ApplebaumLOWRY, Maud T [id number]) Diagnosis Note(continued) 2. Estrogen and progesterone receptors will be performed and reported as an addendum. Addendum: Immunohistochemistry for E-cadherin performed on the left breast upper inner quadrant (part 2) is positive consistent with ductal carcinoma in situ. Jimmy PicketJOHN PATRICK MD Pathologist, Electronic Signature (Case signed 04/05/2022) Corrected Report Signer Jimmy PicketJOHN PATRICK MD Pathologist, Electronic Signature (Case signed 04/06/2022) Specimen Gross and Clinical Information Specimen Comment 1. TIF: 8:19 am CIT: < 5 minutes, h/o bilateral lumpectomy, right breast XRT, no chemo 2. TIF; 9:04 am, calcifications Specimen(s) Obtained: 1. Breast, left, needle core biopsy, 10 o'clock, 3 cmfn, heart clip 2. Breast, left, needle core biopsy, UIQ, X clip Specimen Clinical Information 1. Suspect IDC and DCIS Gross 1. Received in formalin labeled with the patient's name [name](Jadynn Garrido) and "LTBR 10 o'clk 3 cmfn" are three pieces of yellow-tan fibrofatty tissue ranging from 1.2 x 0.2 x 0.2 cm to 1.5 x 0.2 x 0.2 cm, submitted in toto in a single cassette. TIF 08:19, CIT <5 minutes (heart clip). (LEF, 04/02/2022) 2. Received in formalin labeled with the patient's name [name](Briley Menton) and "Ltbr calcs #2" is a radial specimen collection container filled with a 2.2 x 2.0 x 0.4 cm aggregate of yellow-tan fibrofatty tissue. Subunits indicated to contain tissue with calcifications are submitted in cassette 2A and all remaining tissue is submitted in cassette 2B. TIF 09:04, CIT <5  minutes (X clip). (LEF, 04/02/2022) Stain(s) used in Diagnosis: The following stain(s) were used in diagnosing the case: ER-ACIS, E-CAD, PR-ACIS, Her2 FISH, Her2 by IHC, KI-67-ACIS. The control(s) stained appropriately. ADDITIONAL INFORMATION: 1. Breast, left, needle core biopsy, 10 o'clock, 3cmfn,heart clip FLUORESCENCE IN-SITU HYBRIDIZATION 2 of 4 Amended copy Addendum FINAL for Barnett ApplebaumLOWRY, Delberta T [id number]) ADDITIONAL INFORMATION:(continued) Results: GROUP 5: HER2 **NEGATIVE** Equivocal form of amplification of the HER2 gene was detected in the IHC 2+ tissue sample received from this individual. HER2 FISH was performed by a technologist and cell imaging and analysis on the [employer]. RATIO OF HER2/CEN17 SIGNALS 1.34 AVERAGE HER2 COPY NUMBER PER CELL 1.95 The ratio of HER2/CEN 17 is within the range < 2.0 of HER2/CEN 17 and a copy number of HER2 signals per cell is <4.0. Arch Pathol Lab Med 1:1,[address] MD Pathologist, Electronic Signature ( Signed 04/08/2022) 1. Breast, left, needle core biopsy, 10 o'clock, 3 cmfn, heart clip PROGNOSTIC INDICATORS Results: IMMUNOHISTOCHEMICAL AND MORPHOMETRIC ANALYSIS PERFORMED MANUALLY The tumor cells are equivocal for Her2 (2+). Her2 by FISH will be performed and the results reported separately. Estrogen Receptor: 100%, POSITIVE, STRONG STAINING INTENSITY Progesterone Receptor: 60%, POSITIVE, STRONG STAINING INTENSITY Proliferation Marker Ki67: 5% REFERENCE RANGE ESTROGEN RECEPTOR NEGATIVE 0% POSITIVE =>1% REFERENCE RANGE PROGESTERONE RECEPTOR NEGATIVE 0% POSITIVE =>1% All controls stained appropriately Jimmy PicketJOHN PATRICK MD Pathologist, Electronic Signature ( Signed 04/06/2022) 2. Breast, left, needle core biopsy, UIQ, X clip PROGNOSTIC INDICATORS Results: IMMUNOHISTOCHEMICAL AND MORPHOMETRIC ANALYSIS PERFORMED MANUALLY 3 of 4 Amended copy Addendum FINAL for Barnett ApplebaumLOWRY, Lenia T [phone]([id number]) ADDITIONAL  INFORMATION:(continued) Estrogen Receptor: 95%, POSITIVE, STRONG STAINING INTENSITY Progesterone Receptor: 80%, POSITIVE, STRONG STAINING INTENSITY REFERENCE RANGE ESTROGEN RECEPTOR NEGATIVE 0% POSITIVE =>1% REFERENCE RANGE PROGESTERONE RECEPTOR NEGATIVE 0% POSITIVE =>1% All controls stained appropriately Jimmy PicketJOHN PATRICK MD Pathologist, Electronic Signature ( Signed 04/06/2022) Disclaimer PR progesterone receptor (16), immunohistochemical stains are performed on formalin fixed, paraffin embedded tissue using a 3,3"-diaminobenzidine (DAB) chromogen and Leica Bond Autostainer System. The staining intensity of the nucleus is scored manually and is reported as the percentage of tumor cell nuclei demonstrating specific nuclear  staining.Specimens are fixed in 10% Neutral Buffered Formalin for at least 6 hours and up to 72 hours. These tests have not be validated on decalcified tissue. Results should be interpreted with caution given the possibility of false negative results on decalcified specimens. Some of these immunohistochemical stains may have been developed and the performance characteristics determined by [facility]. Some may not have been cleared or approved by the U.S. Food and Drug Administration. The FDA has determined that such clearance or approval is not necessary. This test is used for clinical purposes. It should not be regarded as investigational or for research. This laboratory is certified under the Clinical Laboratory Improvement Amendments of 1988 (CLIA-88) as qualified to perform high complexity clinical laboratory testing. Ki-67 (MM1), immunohistochemical stains are performed on formalin fixed, paraffin embedded tissue using a 3,3"-diaminobenzidine (DAB) chromogen and Leica Bond Autostainer System. The staining intensity of the nucleus is scored manually and is reported as the percentage of tumor cell nuclei demonstrating specific nuclear staining.Specimens are  fixed in 10% Neutral Buffered Formalin for at least 6 hours and up to 72 hours. These tests have not be validated on decalcified tissue. Results should be interpreted with caution given the possibility of false negative results on decalcified specimens. Estrogen receptor (6F11), immunohistochemical stains are performed on formalin fixed, paraffin embedded tissue using a 3,3"-diaminobenzidine (DAB) chromogen and Leica Bond Autostainer System. The staining intensity of the nucleus is scored manually and is reported as the percentage of tumor cell nuclei demonstrating specific nuclear staining.Specimens are fixed in 10% Neutral Buffered Formalin for at least 6 hours and up to 72 hours. These tests have not be validated on decalcified tissue. Results should be interpreted with caution given the possibility of false negative results on decalcified specimens. HER2 IQFISH pharmDX (code [phone]) is a direct fluorescence in-situ hybridization assay designed to quantitatively determine HER2 gene amplification in formalin-fixed, paraffin-embedded tissue specimens. Results are reported using ASCO/CAP and manufacturer's scoring criteria. GROUP 1: Ratio of HER2/CEN 17 >=2.0 and average HER2 >=4.0 signals/cell = POSITIVE. GROUP 2: Ratio of HER2 /CEN 17 >=2.0 and average HER2 < 4.0 signals/cell with an IHC of 2+ = Review of 20 additional cells. If score is still >=2.0 and average HER2 < 4.0 = NEGATIVE. GROUP 3: Ratio of Her2/CEN 17 < 2.0 and average HER2 >=6.0 signals/cell with an IHC of 2+ = Review of 20 additional cells. If score is still < 2.0 and average HER2 >=6.0 signals/cell= POSITIVE. GROUP 4: Ratio of Her2 /CEN 17 < 2.0 and average HER2 >=4.0 and < 6.0 signals/cell with an IHC score of 2+ =Review of 20 additional cells. If score is still < 2.0 and average HER2 >=4.0 and < 6.0 signals/cell= NEGATIVE. GROUP 5: Ratio of HER2/CEN 17 < 2.0 and average HER2 < 4.0 signals/cell = NEGATIVE Specimens are fixed in 10%  Neutral Buffered Formalin for at least 6 hours and up to 72 hours. These tests have not been validated on decalcified tissue. Results should be interpreted with caution given the possibility of false negative results on decalcified specimens. IHC scores are reported using ASCO/CAP scoring criteria. An IHC Score of 0 or 1+ is NEGATIVE for HER2, 3+ is POSITIVE for HER2, and 2+ is EQUIVOCAL. Equivocal results are reflexed to either FISH or IHC testing. Specimens are fixed in 10% Neutral Buffered Formalin for at least 6 hours and up to 72 hours. These tests have not be validated on decalcified tissue. Results should be interpreted with caution given the possibility  of false negative results on decalcified specimens. Some of these immunohistochemical stains may have been developed and the performance characteristics determined by [facility]. Some may not have been cleared or approved by the U.S. Food and Drug Administration. The FDA has determined that such clearance or approval is not necessary. This test is used for clinical purposes. It should not be regarded as investigational or for research. This laboratory is certified under the Clinical Laboratory Improvement Amendments of 1988 (CLIA-88) as qualified to perform high complexity clinical laboratory testing. Report signed out from the following location(s) Technical Component was performed at [facility]. [address],[place] [address].CLIA:[id number],CAP:[id number]. , Technical component and interpretation was performed at [facility] [address] [place], [place], [place] [address]. CLIA #: [id number] , 4 of 4 Amended copy  Assessment and Plan:  Diagnoses and all orders for this visit:  Malignant neoplasm of upper-outer quadrant of right breast in female, estrogen receptor positive - MRI breast bilateral with and without contrast; Future  Breast cancer, stage 1, estrogen receptor positive,  left  Ductal carcinoma in situ (DCIS) of left breast  History of breast cancer   Long discussion of options. She does have a lobular subtype now and recommend MRI for more definitive evaluation of size.  She has now chosen breast conserving surgery with left breast seed lumpectomy and left sentinel lymph node mapping The procedure has been discussed with the patient. Alternatives to surgery have been discussed with the patient.  Risks of surgery include bleeding,  Infection,  Seroma formation, death,  and the need for further surgery.   The patient understands and wishes to proceed.   Sentinel lymph node mapping and dissection has been discussed with the patient.  Risk of bleeding,  Infection,  Seroma formation,  Additional procedures,arm swelling, ,  Shoulder weakness ,  Shoulder stiffness,  Nerve and blood vessel injury and reaction to the mapping dyes have been discussed.  Alternatives to surgery have been discussed with the patient.  The patient agrees to proceed. Hayden Rasmussen, MD

## 2022-05-27 NOTE — Anesthesia Procedure Notes (Signed)
Procedure Name: LMA Insertion Date/Time: 05/27/2022 12:02 PM  Performed by: Loleta Ranier Coach, CRNAPre-anesthesia Checklist: Patient identified, Patient being monitored, Timeout performed, Emergency Drugs available and Suction available Patient Re-evaluated:Patient Re-evaluated prior to induction Oxygen Delivery Method: Circle system utilized Preoxygenation: Pre-oxygenation with 100% oxygen Induction Type: IV induction Ventilation: Mask ventilation without difficulty LMA: LMA inserted Tube type: Oral Number of attempts: 1 Placement Confirmation: positive ETCO2 and breath sounds checked- equal and bilateral Tube secured with: Tape Dental Injury: Teeth and Oropharynx as per pre-operative assessment

## 2022-05-27 NOTE — Interval H&P Note (Signed)
History and Physical Interval Note:  05/27/2022 9:31 AM  Margaret Beasley  has presented today for surgery, with the diagnosis of LEFT BREAST CANCER.  The various methods of treatment have been discussed with the patient and family. After consideration of risks, benefits and other options for treatment, the patient has consented to  Procedure(s): LEFT BREAST BRACKETED LUMPECTOMY WITH RADIOACTIVE SEED AND SENTINEL LYMPH NODE BIOPSY (Left) as a surgical intervention.  The patient's history has been reviewed, patient examined, no change in status, stable for surgery.  I have reviewed the patient's chart and labs.  Questions were answered to the patient's satisfaction.     [name] A Ellyson Rarick

## 2022-05-27 NOTE — Transfer of Care (Signed)
Immediate Anesthesia Transfer of Care Note  Patient: Margaret Beasley  Procedure(s) Performed: LEFT BREAST BRACKETED LUMPECTOMY WITH RADIOACTIVE SEED AND SENTINEL LYMPH NODE BIOPSY (Left: Breast)  Patient Location: PACU  Anesthesia Type:General  Level of Consciousness: sedated and unresponsive  Airway & Oxygen Therapy: Patient Spontanous Breathing and Patient connected to face mask oxygen  Post-op Assessment: Report given to RN and Post -op Vital signs reviewed and stable  Post vital signs: Reviewed and stable  Last Vitals:  Vitals Value Taken Time  BP 112/69 05/27/22 1339  Temp    Pulse 92 05/27/22 1342  Resp 12 05/27/22 1342  SpO2 95 % 05/27/22 1342  Vitals shown include unvalidated device data.  Last Pain:  Vitals:   05/27/22 0940  PainSc: 2       Patients Stated Pain Goal: 1 (05/27/22 0940)  Complications: No notable events documented.

## 2022-05-27 NOTE — Op Note (Signed)
Preoperative diagnosis: Stage II left breast cancer, hormone receptor positive  Postoperative diagnosis: Same  Procedure: Left breast seed localized lumpectomy utilizing bracketed approach and deep left axillary sentinel lymph node mapping using mag trace.  Surgeon: Harriette Bouillon, MD  Anesthesia: LMA with 0.25% Marcaine with and pectoral block by anesthesia  Drains: None  Specimen: Left breast tissue with 2 seeds and 2 clips verified by Faxitron.  All margins excised around as well.  All tissue was oriented with ink.  There were 4 left axillary sentinel nodes all with uptake of tracer  Drains: None  EBL: 40 cc  Indications for procedure: The patient is a 67 year old female with stage II left breast cancer.  She was seen in the multidisciplinary clinic and opted for breast conserving surgery after discussion of all of her options review of her test.The procedure has been discussed with the patient. Alternatives to surgery have been discussed with the patient.  Risks of surgery include bleeding,  Infection,  Seroma formation, death,  and the need for further surgery.   The patient understands and wishes to proceed. Sentinel lymph node mapping and dissection has been discussed with the patient.  Risk of bleeding,  Infection,  Seroma formation,  Additional procedures,,  Shoulder weakness ,  Shoulder stiffness,  Nerve and blood vessel injury and reaction to the mapping dyes have been discussed.  Alternatives to surgery have been discussed with the patient.  The patient agrees to proceed.    Description of procedure: Patient was seen in the holding area and questions were answered.  Left breast was marked as correct site and pectoral block was placed by anesthesia.  Seeds were placed as an outpatient.  She was taken to the operating room.  She was placed supine upon the OR table.  After induction of general anesthesia, left breast was prepped and draped in sterile fashion and timeout performed.   Proper patient, site and procedure verified.  2 cc of MAC tracer injected in the subareolar plexus and massaged.  Second timeout was performed.  Neoprobe used to identify both seeds with the assistance of the films in the left breast upper outer quadrant.  Incision was made around the lateral border of the nipple areolar complex and dissection was carried down.  All tissue around both seeds and both clips were excised with what appeared to be negative margins.  I opted to excise all areas around this given her vague imaging to ensure the best possibility of a negative margin.  All tissue was oriented with ink.  It was then passed off the field for imaging.  Both seeds and both clips were verified.  The cavity was infiltrated local anesthetic and closed after placing clips the deep layer 3-0 Vicryl.  4 Monocryl used to close the skin.  Mag trace probe used an area of increased uptake in the left axilla was noted.  A transverse incision was made in the inferior axillary hairline.  Dissection was carried down into the deep left axillary lymph nodes.  Neoprobe was used and there were a number of nodes had taken up the tracer diffusely.  These were about 4 and all of these were taken out.  Comparison to the background showed these to be the highest areas of uptake with much less uptake throughout the axilla.  The cavities made hemostatic with cautery and Arista.  Of note irrigation was used prior to the application of Arista.  The long thoracic nerve, thoracodorsal trunk and extra vein were all  well-preserved.  Local anesthetic infiltrated.  We then closed the deep tissue planes with 3-0 Vicryl.  4-0 Monocryl used to close the skin.  Dermabond and breast binder placed.  All counts found to be correct patient.  Patient taken to recovery in satisfactory condition.

## 2022-05-27 NOTE — Progress Notes (Signed)
Discussed history of 3 lymph nodes excised during right breast surgery in 2022 with Mal Amabile before determining where to place IV. Dr. Malen Gauze stated it was okay to start an IV in the right arm for todays surgery. He placed 20g IV in right hand.

## 2022-05-28 ENCOUNTER — Encounter (HOSPITAL_COMMUNITY): Payer: Self-pay | Admitting: Surgery

## 2022-06-01 LAB — SURGICAL PATHOLOGY

## 2022-06-02 ENCOUNTER — Encounter: Payer: Self-pay | Admitting: *Deleted

## 2022-06-04 NOTE — Progress Notes (Signed)
Patient Care Team: [name], MD as PCP - General (Family Medicine) Paulino Rily (Inactive) as Referring Physician (Neurology) Pershing Proud, RN as Oncology Nurse Navigator Donnelly Angelica, RN as Oncology Nurse Navigator Harriette Bouillon, MD as Consulting Physician (General Surgery) [name], MD as Consulting Physician (Hematology and Oncology) Antony Blackbird, MD as Consulting Physician (Radiation Oncology)  DIAGNOSIS: No diagnosis found.  SUMMARY OF ONCOLOGIC HISTORY: Oncology History  Malignant neoplasm of upper-outer quadrant of right breast in female, estrogen receptor positive  05/21/2020 Initial Diagnosis   Screening mammogram showed a possible mass in the right breast. Diagnostic mammogram and US showed a 0.5cm mass at the 12 o'clock position in the right breast, with no right axillary abnormalities. Biopsy showed invasive and in situ ductal carcinoma, grade 1, HER-2 equivocal by IHC, negative by FISH (ratio 1.22), ER+ 95%, PR+ 90%, Ki67 10%.    05/28/2020 Cancer Staging   Staging form: Breast, AJCC 8th Edition - Clinical stage from 05/28/2020: Stage IA (cT1a, cN0, cM0, G2, ER+, PR+, HER2-) - Signed by [name], MD on 05/28/2020 Stage prefix: Initial diagnosis Method of lymph node assessment: Clinical Histologic grading system: 3 grade system   06/04/2020 Genetic Testing   Negative hereditary cancer genetic testing: no pathogenic variants detected in [facility] Multi-Cancer Panel.  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  The report date is June 04, 2020.   The Multi-Cancer Panel offered by [facility] includes sequencing and/or deletion duplication testing of the following 84 genes: AIP, ALK, APC, ATM, AXIN2,BAP1,  BARD1, BLM, BMPR1A, BRCA1, BRCA2, BRIP1, CASR, CDC73, CDH1, CDK4, CDKN1B, CDKN1C, CDKN2A (p14ARF), CDKN2A (p16INK4a), CEBPA, CHEK2, CTNNA1, DICER1, DIS3L2, EGFR (c.2369C>T, p.Thr790Met variant only), EPCAM  (Deletion/duplication testing only), FH, FLCN, GATA2, GPC3, GREM1 (Promoter region deletion/duplication testing only), HOXB13 (c.251G>A, p.Gly84Glu), HRAS, KIT, MAX, MEN1, MET, MITF (c.952G>A, p.Glu318Lys variant only), MLH1, MSH2, MSH3, MSH6, MUTYH, NBN, NF1, NF2, NTHL1, PALB2, PDGFRA, PHOX2B, PMS2, POLD1, POLE, POT1, PRKAR1A, PTCH1, PTEN, RAD50, RAD51C, RAD51D, RB1, RECQL4, RET, RUNX1, SDHAF2, SDHA (sequence changes only), SDHB, SDHC, SDHD, SMAD4, SMARCA4, SMARCB1, SMARCE1, STK11, SUFU, TERC, TERT, TMEM127, TP53, TSC1, TSC2, VHL, WRN and WT1.    06/26/2020 Surgery   Right lumpectomy ([facility]): IDC, grade 1, 0.2cm, with intermediate grade DCIS, and 2 right axillary lymph nodes negative for carcinoma.   Malignant neoplasm of upper-inner quadrant of left breast in female, estrogen receptor positive  04/02/2022 Initial Diagnosis   Suspicious 0.9 cm mass left breast 10 o'clock position, indeterminate 1.5 cm group of calcifications UIQ left breast: Mass and calcifications span 3.7 cm.  No axillary lymph node, biopsy: Grade 1 IDC with DCIS ER 100%, PR 60%, Ki-67 5%, HER2 2+ by IHC FISH negative ratio 1.34, left UIQ biopsy: Low-grade DCIS ER 95%, PR 80%     CHIEF COMPLIANT: Follow-up after surgery  INTERVAL HISTORY: Margaret Beasley is a 67 y.o. female with above-mentioned history of right breast cancer having undergone lumpectomy, currently undergoing radiation therapy. Currently on letrozole. She reports today for follow-up.    ALLERGIES:  is allergic to opium, topiramate, benadryl [diphenhydramine hcl], butorphanol, celecoxib, latex, penicillins, arimidex [anastrozole], diphenhydramine-acetaminophen, gabapentin, oxycodone-acetaminophen, oxycodone-acetaminophen, requip [ropinirole], stadol [butorphanol tartrate], topamax, and tramadol.  MEDICATIONS:  Current Outpatient Medications  Medication Sig Dispense Refill   acetaminophen (TYLENOL) 650 MG CR tablet Take 650-1,300 mg by mouth daily as needed  (pain.).     DULoxetine (CYMBALTA) 30 MG capsule TAKE 3 CAPSULES BY MOUTH EVERY DAY 270 capsule 1  folic acid (FOLVITE) 1 MG tablet Take 1 tablet by mouth in the morning.     HYDROcodone-acetaminophen (NORCO/VICODIN) 5-325 MG tablet Take 1 tablet by mouth every 6 (six) hours as needed for moderate pain. 15 tablet 0   HYDROcodone-acetaminophen (NORCO/VICODIN) 5-325 MG tablet Take 1 tablet by mouth every 6 (six) hours as needed for moderate pain. 15 tablet 0   lamoTRIgine (LAMICTAL) 100 MG tablet Take 200 mg by mouth at bedtime.     letrozole (FEMARA) 2.5 MG tablet Take 1 tablet (2.5 mg total) by mouth daily. (Patient taking differently: Take 2.5 mg by mouth at bedtime.) 90 tablet 3   levETIRAcetam (KEPPRA XR) 500 MG 24 hr tablet Take 1,000 mg by mouth in the morning and at bedtime.     levothyroxine (SYNTHROID) 50 MCG tablet Take 1 tablet (50 mcg total) by mouth daily before breakfast. 90 tablet 1   losartan (COZAAR) 50 MG tablet TAKE 1 TABLET BY MOUTH EVERY DAY (Patient taking differently: Take 50 mg by mouth at bedtime.) 90 tablet 0   promethazine (PHENERGAN) 25 MG suppository Place 1 suppository (25 mg total) rectally every 6 (six) hours as needed for nausea or vomiting. 12 each 2   simvastatin (ZOCOR) 40 MG tablet TAKE 1 TABLET BY MOUTH EVERYDAY AT BEDTIME 90 tablet 3   SUMAtriptan (IMITREX) 100 MG tablet TAKE 1 TABLET (100 MG TOTAL) BY MOUTH EVERY 2 (TWO) HOURS AS NEEDED FOR MIGRAINE. 30 tablet 3   No current facility-administered medications for this visit.    PHYSICAL EXAMINATION: ECOG PERFORMANCE STATUS: {CHL ONC ECOG PS:[phone]}  There were no vitals filed for this visit. There were no vitals filed for this visit.  BREAST:*** No palpable masses or nodules in either right or left breasts. No palpable axillary supraclavicular or infraclavicular adenopathy no breast tenderness or nipple discharge. (exam performed in the presence of a chaperone)  LABORATORY DATA:  I have reviewed  the data as listed    Latest Ref Rng & Units 05/19/2022    3:00 PM 03/04/2022    3:19 PM 12/10/2021   12:00 AM  CMP  Glucose 70 - 99 mg/dL 88  84  95   BUN 8 - 23 mg/dL 12  10  7    Creatinine 0.44 - 1.00 mg/dL 1.61  0.96  0.45   Sodium 135 - 145 mmol/L 138  140  139   Potassium 3.5 - 5.1 mmol/L 5.6  4.9  5.0   Chloride 98 - 111 mmol/L 101  102  105   CO2 22 - 32 mmol/L 29  32  30   Calcium 8.9 - 10.3 mg/dL 9.4  40.9  8.8   Total Protein 6.5 - 8.1 g/dL  7.0  6.2   Total Bilirubin 0.3 - 1.2 mg/dL  0.5  0.4   Alkaline Phos 38 - 126 U/L  69    AST 15 - 41 U/L  22  18   ALT 0 - 44 U/L  21  19     Lab Results  Component Value Date   WBC 5.8 05/19/2022   HGB 14.8 05/19/2022   HCT 46.6 (H) 05/19/2022   MCV 100.4 (H) 05/19/2022   PLT 232 05/19/2022   NEUTROABS 2.6 03/04/2022    ASSESSMENT & PLAN:  No problem-specific Assessment & Plan notes found for this encounter.    No orders of the defined types were placed in this encounter.  The patient has a good understanding of the overall plan. she agrees  with it. she will call with any problems that may develop before the next visit here. Total time spent: 30 mins including face to face time and time spent for planning, charting and co-ordination of care   Sherlyn Lick, CMA 06/04/22    I Janan Ridge am acting as a Neurosurgeon for [employer]  ***

## 2022-06-05 ENCOUNTER — Other Ambulatory Visit: Payer: Self-pay | Admitting: Family Medicine

## 2022-06-05 DIAGNOSIS — I10 Essential (primary) hypertension: Secondary | ICD-10-CM

## 2022-06-07 NOTE — Progress Notes (Signed)
Location of Breast Cancer: Malignant neoplasm of upper-inner quadrant of left breast in female, estrogen receptor positive   Histology per Pathology Report: FINAL MICROSCOPIC DIAGNOSIS: 05-27-22 A. 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 (X clip) Invasive tumor measures 0.7 x 0.6 x 0.5 cm (pT1b) Margins free (lobular carcinoma 1 mm from medial posterior margin; LCIS 1.5 mm from medial margin; DCIS 6 mm from lateral margin) Negative for angiolymphatic invasion Changes consistent with prior biopsies (heart clip and X clip) Prognostic markers (from reports [id number]): Lobular carcinoma: estrogen receptor positive, progesterone receptor positive, Ki-67 5%, HER2 group 5 negative by FISH; DCIS estrogen receptor positive, progesterone receptor positive Fibrocystic changes including stromal fibrosis, cystic dilatation of ducts, adenosis and usual duct hyperplasia Microcalcifications present within DCIS, benign ducts and benign fibrocystic stroma  B. LEFT AXILLARY SENTINEL LYMPH NODE, EXCISION: One benign lymph node with extensive fatty replacement, negative for carcinoma (0/1)  C. LEFT AXILLARY SENTINEL LYMPH NODE, EXCISION: One benign lymph node with extensive fatty replacement, negative for carcinoma (0/1)  D. LEFT AXILLARY SENTINEL LYMPH NODE, EXCISION: One benign lymph node with extensive fatty replacement, negative for carcinoma (0/1)  E. LEFT AXILLARY SENTINEL LYMPH NODE, EXCISION: One benign lymph node with extensive fatty replacement, negative for carcinoma (0/1)  F. LEFT AXILLARY SENTINEL LYMPH NODE, EXCISION: One benign lymph node with extensive fatty replacement, negative for carcinoma (0/1)  G. LEFT BREAST, ADDITIONAL ANTERIOR SUPERIOR MARGIN, EXCISION: Benign breast with fibrocystic changes including stromal fibrosis  and cystic dilatation of ducts Negative for carcinoma  H. LEFT BREAST, ADDITIONAL POSTERIOR MEDIAL MARGIN, EXCISION: Benign breast, predominantly mature adipose Negative for carcinoma  I.  LEFT BREAST, ADDITIONAL INFERIOR LATERAL MARGIN, EXCISION: Lobular carcinoma in situ Negative for invasive carcinoma LCIS 3.5 mm from final inferior margin Fibrocystic changes including stromal fibrosis, cystic dilatation of ducts and apocrine metaplasia  ONCOLOGY TABLE:  INVASIVE CARCINOMA OF THE BREAST:  Resection  Procedure: Lumpectomy with sentinel nodes and additional margins Specimen Laterality: Left Histologic Type: Lobular carcinoma Histologic Grade:      Glandular (Acinar)/Tubular Differentiation: 3      Nuclear Pleomorphism: 1      Mitotic Rate: 1      Overall Grade: Grade 1 (5/9) Tumor Size: 0.7 x 0.6 x 0.5 cm Ductal Carcinoma In Situ: Low-grade ductal carcinoma in situ, 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 present; lobular carcinoma in situ also present Lymphatic and/or Vascular Invasion: Not identified Treatment Effect in the Breast: No known presurgical therapy Margins: All margins negative for invasive carcinoma      Distance from Closest Margin (mm): 1 mm from medial posterior margin (final margin at least 14 mm) DCIS Margins: Uninvolved by DCIS and LCIS      Distance from Closest Margin (mm): DCIS 6 mm from lateral margin (final lateral margin at least 17 mm) Regional Lymph Nodes:      Number of Lymph Nodes Examined: 5      Number of Sentinel Nodes Examined: 5      Number of Lymph Nodes with Macrometastases (>2 mm): 0      Number of Lymph Nodes with Micrometastases: 0      Number of Lymph Nodes with Isolated Tumor Cells (=0.2 mm or =200 cells): 0 Distant Metastasis: Not applicable Breast Biomarker Testing Performed on Previous Biopsy:      Testing Performed on Case Number: [id number]  Estrogen Receptor: Positive (100%, strong staining); DCIS positive (95%,  strong staining)            Progesterone Receptor: Positive (60%, strong staining); DCIS positive (80%, strong staining)            HER2: Group 5 negative by FISH            Ki-67: 5% Pathologic Stage Classification (pTNM, AJCC 8th Edition): pT1b, pN0 Representative Tumor Block: A5, A6 Comment(s): An immunohistochemical stain for cytokeratin 7 performed with adequate control on each of the sections of each of the lymph nodes is negative for metastatic tumor (0/5). (v4.5.0.0)  Receptor Status: ER(100%), PR (60%), Her2-neu (Negative via FISH), Ki-67(5%)  Did patient present with symptoms (if so, please note symptoms) or was this found on screening mammography?: screening mammogram on 03-23-22  Past/Anticipated interventions by surgeon, if any: 05-27-22 Procedure: Left breast seed localized lumpectomy utilizing bracketed approach and deep left axillary sentinel lymph node mapping using mag trace.   Surgeon: Harriette Bouillon, MD  Past/Anticipated interventions by medical oncology, if any:  04/13/2022 Dr. [name] --Treatment plan (left breast) Breast conserving surgery with sentinel lymph node biopsy Adjuvant radiation Followed by adjuvant antiestrogen therapy with letrozole 2.5 mg daily x 5 years I discussed with her that we can start the letrozole tablet today and see if she can tolerate it. I sent a message to Dr. Luisa Hart that patient prefers lumpectomy instead of mastectomy. 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   I will refer her to genetic counseling.   She does not do genetic test in 2012 Return to clinic after surgery to discuss final pathology report   --Treatment Summary (right breast) 06/26/20: Rt Lumpectomy: Grade 1 IDC with DCIS 0/2 LN neg, Margins neg, ER+ 95%, PR+ 90%, Ki67 10%. , Her 2 Neg Adjuvant XRT: completed 09/24/2020 Followed by Adj Anti estrogen therapy (patient stopped after  taking 1 tablet because of nausea)  Dr. [name] on 06-09-22 ASSESSMENT & PLAN:  Malignant neoplasm of upper-outer quadrant of right breast in female, estrogen receptor positive (HCC) 05/21/2020: Screening mammogram showed a possible mass in the right breast. Diagnostic mammogram and US showed a 0.5cm mass at the 12 o'clock position in the right breast, with no right axillary abnormalities. Biopsy showed invasive and in situ ductal carcinoma, grade 1, HER-2 equivocal by IHC, negative by FISH (ratio 1.22), ER+ 95%, PR+ 90%, Ki67 10%.    Treatment Summary 1. 06/26/20: Rt Lumpectomy: Grade 1 IDC with DCIS 0/2 LN neg, Margins neg, ER+ 95%, PR+ 90%, Ki67 10%. , Her 2 Neg 2. Adjuvant XRT: completed 09/24/2020 3. Followed by Adj Anti estrogen therapy (patient stopped after taking 1 tablet because of nausea)   Malignant neoplasm of upper-inner quadrant of left breast in female, estrogen receptor positive (HCC) 04/02/2022: Suspicious 0.9 cm mass left breast 10 o'clock position, indeterminate 1.5 cm group of calcifications UIQ left breast: Mass and calcifications span 3.7 cm.  No axillary lymph node, biopsy: Grade 1 IDC with DCIS ER 100%, PR 60%, Ki-67 5%, HER2 2+ by IHC FISH negative ratio 1.34, left UIQ biopsy: Low-grade DCIS ER 95%, PR 80%    05/27/22: Left Lumpectomy: 0.7 cm Grade 1 ILC with LCIS, LG DCIS, Margins Negative, 0/5 LN Neg, ER 100%, PR: 60%, Her 2 : neg, Ki 67: 5%   Pathology counseling: I discussed the final pathology report of the patient  provided  a copy of this report. I discussed the margins as well as lymph node surgeries. We also discussed the final staging along with previously performed ER/PR and HER-2/neu testing.   Treatment plan: Adjuvant radiation Followed by adjuvant antiestrogen therapy with letrozole 2.5 mg daily x 5 years (started neoadj on 04/13/22)   Bone density 10/03/2020: T-score -1.6: Osteopenia   RTC after XRT is done  Lymphedema issues, if any:  none  Pain issues, if  any:  pt reports general pain from RA, she used to take Methotrexate but was taken off due to cancer treatments. Keeps general body pain of 6/10 at baseline.   SAFETY ISSUES:  Pacemaker/ICD? no Possible current pregnancy?no Is the patient on methotrexate? no  Current Complaints / other details:  Pt has seroma above left breast that has been drained twice, last drained 06-16-22. She now on Doxycycline for this. She reports it has definitely improved with treatment. No other major concerns as this will be her third round of treatment.

## 2022-06-09 ENCOUNTER — Inpatient Hospital Stay (HOSPITAL_BASED_OUTPATIENT_CLINIC_OR_DEPARTMENT_OTHER): Payer: 59 | Admitting: Hematology and Oncology

## 2022-06-09 ENCOUNTER — Encounter: Payer: Self-pay | Admitting: *Deleted

## 2022-06-09 ENCOUNTER — Other Ambulatory Visit: Payer: Self-pay

## 2022-06-09 VITALS — BP 133/83 | HR 91 | Temp 97.3°F | Resp 18 | Ht 63.0 in | Wt 243.0 lb

## 2022-06-09 DIAGNOSIS — Z170 Estrogen receptor positive status [ER+]: Secondary | ICD-10-CM

## 2022-06-09 DIAGNOSIS — Z923 Personal history of irradiation: Secondary | ICD-10-CM | POA: Diagnosis not present

## 2022-06-09 DIAGNOSIS — C50212 Malignant neoplasm of upper-inner quadrant of left female breast: Secondary | ICD-10-CM | POA: Diagnosis not present

## 2022-06-09 DIAGNOSIS — C50411 Malignant neoplasm of upper-outer quadrant of right female breast: Secondary | ICD-10-CM | POA: Diagnosis not present

## 2022-06-09 DIAGNOSIS — Z79811 Long term (current) use of aromatase inhibitors: Secondary | ICD-10-CM | POA: Diagnosis not present

## 2022-06-09 DIAGNOSIS — M8580 Other specified disorders of bone density and structure, unspecified site: Secondary | ICD-10-CM | POA: Diagnosis not present

## 2022-06-09 NOTE — Assessment & Plan Note (Signed)
05/21/2020: Screening mammogram showed a possible mass in the right breast. Diagnostic mammogram and US showed a 0.5cm mass at the 12 o'clock position in the right breast, with no right axillary abnormalities. Biopsy showed invasive and in situ ductal carcinoma, grade 1, HER-2 equivocal by IHC, negative by FISH (ratio 1.22), ER+ 95%, PR+ 90%, Ki67 10%.    Treatment Summary 1. 06/26/20: Rt Lumpectomy: Grade 1 IDC with DCIS 0/2 LN neg, Margins neg, ER+ 95%, PR+ 90%, Ki67 10%. , Her 2 Neg 2. Adjuvant XRT: completed 09/24/2020 3. Followed by Adj Anti estrogen therapy (patient stopped after taking 1 tablet because of nausea)

## 2022-06-09 NOTE — Assessment & Plan Note (Signed)
04/02/2022: Suspicious 0.9 cm mass left breast 10 o'clock position, indeterminate 1.5 cm group of calcifications UIQ left breast: Mass and calcifications span 3.7 cm.  No axillary lymph node, biopsy: Grade 1 IDC with DCIS ER 100%, PR 60%, Ki-67 5%, HER2 2+ by IHC FISH negative ratio 1.34, left UIQ biopsy: Low-grade DCIS ER 95%, PR 80%   05/27/22: Left Lumpectomy: 0.7 cm Grade 1 ILC with LCIS, LG DCIS, Margins Negative, 0/5 LN Neg, ER 100%, PR: 60%, Her 2 : neg, Ki 67: 5%  Pathology counseling: I discussed the final pathology report of the patient provided  a copy of this report. I discussed the margins as well as lymph node surgeries. We also discussed the final staging along with previously performed ER/PR and HER-2/neu testing.  Treatment plan: Adjuvant radiation Followed by adjuvant antiestrogen therapy with letrozole 2.5 mg daily x 5 years (started neoadj on 04/13/22)   Bone density 10/03/2020: T-score -1.6: Osteopenia  RTC after XRT is done

## 2022-06-18 ENCOUNTER — Telehealth: Payer: Self-pay

## 2022-06-18 ENCOUNTER — Ambulatory Visit: Payer: 59 | Admitting: Physician Assistant

## 2022-06-18 ENCOUNTER — Encounter: Payer: Self-pay | Admitting: Radiation Oncology

## 2022-06-18 NOTE — Telephone Encounter (Signed)
Rn called to obtain meaningful use and nurse evaluation information. Consult note was completed and routed to Dr. Roselind Messier.

## 2022-06-20 NOTE — Progress Notes (Signed)
Radiation Oncology         [phone] ________________________________  Name: Margaret Beasley MRN: [id number]  Date: 06/21/2022  DOB: Feb 18, 1955  Re-Evaluation Note  CC: Margaret Games, MD  Margaret Croissant, MD  No diagnosis found.  Diagnosis:  The primary encounter diagnosis was Malignant neoplasm of upper-outer quadrant of left breast in female, estrogen receptor positive (HCC). A diagnosis of Malignant neoplasm of upper-inner quadrant of left breast in female, estrogen receptor positive (HCC) was also pertinent to this visit.   Recent diagnosis of Left Breast UIQ, Invasive and in situ lobular carcinoma, ER+ / PR+ / Her2-, Grade 1: s/p lumpectomy and currently on antiestrogen therapy    Stage IA, (cT1a, cN0, cM0) Right Breast UOQ, Invasive Ductal Carcinoma with DCIS, ER+ / PR+ / Her2-, Grade 1-2: diagnosed in 2022 s/p lumpectomy and XRT  Narrative:  The patient returns today to discuss radiation treatment options. She was seen in consultation on 05/05/22.  She underwent genetic testing on her consultation date. Results showed no clinically significant variants detected by [facility] genetic testing. 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  She opted to proceed with a left breast lumpectomy and left axillary SLN biopsies on 05/27/22 under the care of Dr. Luisa Beasley. Pathology from the procedure revealed: tumor the size of 0.7 cm; histology of grade 1 invasive lobular carcinoma with low-grade LCIS and DCIS. All final margins negative for invasive and in situ carcinoma; margin status to invasive disease of at least 14 mm from the final medial-posterior margin; margin status to in situ disease of at least 17 mm from the final lateral margin; nodal status of 5/5 left axillary sentinel lymph node excisions negative for carcinoma. Prognostic indicators significant for: estrogen receptor 100% positive and progesterone receptor 60% positive, both with strong  staining intensity; Proliferation marker Ki67 at 5%; Her2 status negative; Grade 1.   Post-operatively, the patient developed a left axillary seroma. This was aspirated by general surgery on 06/11/22 which yielded approximately 130 cc's of straw colored fluid. Her seroma however returned and became red. Subsequently, the patient returned to general surgery on 06/16/22 and the area was aspirated again, yielding 120 cc's of fluid. In light of new redness to the seroma site, she was accordingly started on a short course of doxycyline.   To review, the patient was started on antiestrogen therapy (letrozole) in an adjuvant setting on 04/13/22. She will follow-up with Dr. Pamelia Beasley following XRT to assess her progress.   Of note: last month, the patient presented to her PCP with a palpable area involving the right submandibular region of the neck and dysphagia for several days. She subsequently had a head and neck soft tissue ultrasound performed on 05/25/22 which showed a possible (not definite) nodule within the submandibular region of the right-side of the neck favored to represent a pseudo nodule. Overall, no definitive correlate for the patient's palpable area of concern involving the submandibular region of the right-side of the neck was appreciated.   On review of systems, the patient reports ***. She denies *** and any other symptoms.   Previous radiation:   Intent: Curative   Radiation Treatment Dates: 08/26/2020 through 09/24/2020 Site Technique Total Dose (Gy) Dose per Fx (Gy) Completed Fx Beam Energies  Breast, Right: Breast_Rt 3D 40.05/40.05 2.67 15/15 10X, 15X  Breast, Right: Breast_Rt_Bst 3D 12/12 2 6/6 6X, 15X    Allergies:  is allergic to opium, topiramate, benadryl [diphenhydramine hcl], butorphanol, celecoxib, latex, penicillins,  arimidex [anastrozole], diphenhydramine-acetaminophen, gabapentin, oxycodone-acetaminophen, oxycodone-acetaminophen, requip [ropinirole], stadol [butorphanol  tartrate], topamax, and tramadol.  Meds: Current Outpatient Medications  Medication Sig Dispense Refill   acetaminophen (TYLENOL) 650 MG CR tablet Take 650-1,300 mg by mouth daily as needed (pain.).     doxycycline (MONODOX) 100 MG capsule Take 100 mg by mouth 2 (two) times daily.     DULoxetine (CYMBALTA) 30 MG capsule TAKE 3 CAPSULES BY MOUTH EVERY DAY 270 capsule 1   folic acid (FOLVITE) 1 MG tablet Take 1 tablet by mouth in the morning.     HYDROcodone-acetaminophen (NORCO/VICODIN) 5-325 MG tablet Take 1 tablet by mouth every 6 (six) hours as needed for moderate pain. 15 tablet 0   HYDROcodone-acetaminophen (NORCO/VICODIN) 5-325 MG tablet Take 1 tablet by mouth every 6 (six) hours as needed for moderate pain. 15 tablet 0   lamoTRIgine (LAMICTAL) 100 MG tablet Take 200 mg by mouth at bedtime.     letrozole (FEMARA) 2.5 MG tablet Take 1 tablet (2.5 mg total) by mouth daily. (Patient taking differently: Take 2.5 mg by mouth at bedtime.) 90 tablet 3   levETIRAcetam (KEPPRA XR) 500 MG 24 hr tablet Take 1,000 mg by mouth in the morning and at bedtime.     levothyroxine (SYNTHROID) 50 MCG tablet Take 1 tablet (50 mcg total) by mouth daily before breakfast. 90 tablet 1   losartan (COZAAR) 50 MG tablet TAKE 1 TABLET BY MOUTH EVERY DAY 90 tablet 0   promethazine (PHENERGAN) 25 MG suppository Place 1 suppository (25 mg total) rectally every 6 (six) hours as needed for nausea or vomiting. 12 each 2   simvastatin (ZOCOR) 40 MG tablet TAKE 1 TABLET BY MOUTH EVERYDAY AT BEDTIME 90 tablet 3   SUMAtriptan (IMITREX) 100 MG tablet TAKE 1 TABLET (100 MG TOTAL) BY MOUTH EVERY 2 (TWO) HOURS AS NEEDED FOR MIGRAINE. 30 tablet 3   No current facility-administered medications for this encounter.    Physical Findings: The patient is in no acute distress. Patient is alert and oriented.  vitals were not taken for this visit.  No significant changes. Lungs are clear to auscultation bilaterally. Heart has regular rate  and rhythm. No palpable cervical, supraclavicular, or axillary adenopathy. Abdomen soft, non-tender, normal bowel sounds. Right Breast: no palpable mass, nipple discharge or bleeding. Left Breast: ***  Lab Findings: Lab Results  Component Value Date   WBC 5.8 05/19/2022   HGB 14.8 05/19/2022   HCT 46.6 (H) 05/19/2022   MCV 100.4 (H) 05/19/2022   PLT 232 05/19/2022    Radiographic Findings: MM Breast Surgical Specimen  Result Date: 05/27/2022 CLINICAL DATA:  Evaluate specimen EXAM: SPECIMEN RADIOGRAPH OF THE LEFT BREAST COMPARISON:  Previous exam(s). FINDINGS: Status post excision of the left breast. The radioactive seeds and biopsy marker clips are present, completely intact, and were marked for pathology. IMPRESSION: Specimen radiograph of the left breast. Electronically Signed   By: Gerome Sam III M.D.   On: 05/27/2022 12:46  US Soft Tissue Head/Neck (NON-THYROID)  Result Date: 05/25/2022 CLINICAL DATA:  Palpable area involving the right submandibular region of the neck for the past 2 days. Dysphagia. EXAM: ULTRASOUND OF HEAD/NECK SOFT TISSUES TECHNIQUE: Ultrasound examination of the head and neck soft tissues was performed in the area of clinical concern. COMPARISON:  None Available. FINDINGS: Sonographic evaluation of the patient's palpable area of concern involving the submandibular region of the right-side of the neck is without definitive sonographic correlate. Questioned ill-defined approximately 1.9 x 1.8 x 1.2 cm  isoechoic nodule within the submandibular region of the right-side of the neck is favored to represent a pseudonodule when correlated with provided cine series (particularly series 5). Scattered adjacent cervical lymph nodes are not enlarged by size criteria and benign in appearance. Incidentally noted marked heterogeneity of the thyroid parenchyma, incompletely evaluated but without discrete nodule. IMPRESSION: 1. No definitive correlate for patient's palpable area of  concern involving the submandibular region of the right-side of the neck. Specifically, no discrete mass or regional cervical lymphadenopathy. If clinical concern persists, further evaluation with contrast-enhanced neck CT could be performed as indicated. 2. Incidentally noted marked heterogeneity of the thyroid parenchyma, incompletely evaluated, but without discrete nodule. Findings are nonspecific though could be seen in the setting of a thyroiditis. Clinical correlation is advised. Further evaluation with dedicated thyroid ultrasound could be performed as indicated. Electronically Signed   By: Simonne Come M.D.   On: 05/25/2022 15:59   MM LT RAD SEED EA ADD LESION LOC MAMMO  Result Date: 05/25/2022 : CLINICAL DATA: Localization prior to surgery EXAM: MAMMOGRAPHIC GUIDED RADIOACTIVE SEED LOCALIZATION OF THE LEFT BREAST COMPARISON: Previous exam(s). FINDINGS: Patient presents for radioactive seed localization prior to surgery. I met with the patient and we discussed the procedure of seed localization including benefits and alternatives. We discussed the high likelihood of a successful procedure. We discussed the risks of the procedure including infection, bleeding, tissue injury and further surgery. We discussed the low dose of radioactivity involved in the procedure. Informed, written consent was given. The usual time-out protocol was performed immediately prior to the procedure. Using mammographic guidance, sterile technique, 1% lidocaine and an I-125 radioactive seed, the X shaped biopsy clip was localized using a superior approach. The follow-up mammogram images confirm the seed in the expected location. The radioactive seed is 4.7 mm superior to the X shaped clip, in good position. Follow-up survey of the patient confirms presence of the radioactive seed. Order number of I-125 seed: [id number]. Total activity: 0.239 millicurie reference Date: May 05, 2022 Using mammographic guidance, sterile technique, 1%  lidocaine and an I-125 radioactive seed, the heart shaped clip was localized using a superior approach. The follow-up mammogram images confirm the seed in the expected location. The radioactive seed is 3.7 mm inferior to the heart shaped clip in adequate position. Follow-up survey of the patient confirms presence of the radioactive seed. Order number of I-125 seed: [id number]. Total activity: 0.254 millicuries reference Date: April 23, 2022 The patient tolerated the procedure well and was released from the [facility]. She was given instructions regarding seed removal. IMPRESSION: Radioactive seed localization left breast. No apparent complications. Electronically Signed   By: Gerome Sam III M.D.   On: 05/25/2022 13:29   MM LT RADIOACTIVE SEED LOC MAMMO GUIDE  Result Date: 05/25/2022 CLINICAL DATA:  Localization prior to surgery EXAM: MAMMOGRAPHIC GUIDED RADIOACTIVE SEED LOCALIZATION OF THE LEFT BREAST COMPARISON:  Previous exam(s). FINDINGS: Patient presents for radioactive seed localization prior to surgery. I met with the patient and we discussed the procedure of seed localization including benefits and alternatives. We discussed the high likelihood of a successful procedure. We discussed the risks of the procedure including infection, bleeding, tissue injury and further surgery. We discussed the low dose of radioactivity involved in the procedure. Informed, written consent was given. The usual time-out protocol was performed immediately prior to the procedure. Using mammographic guidance, sterile technique, 1% lidocaine and an I-125 radioactive seed, the X shaped biopsy clip was localized using a superior approach.  The follow-up mammogram images confirm the seed in the expected location. The radioactive seed is 4.7 mm superior to the X shaped clip, in good position. Follow-up survey of the patient confirms presence of the radioactive seed. Order number of I-125 seed:  [id number]. Total activity:  0.239  millicurie reference Date: May 05, 2022 Using mammographic guidance, sterile technique, 1% lidocaine and an I-125 radioactive seed, the heart shaped clip was localized using a superior approach. The follow-up mammogram images confirm the seed in the expected location. The radioactive seed is 3.7 mm inferior to the heart shaped clip in adequate position. Follow-up survey of the patient confirms presence of the radioactive seed. Order number of I-125 seed:  [id number]. Total activity:  0.254 millicuries reference Date: April 23, 2022 The patient tolerated the procedure well and was released from the [facility]. She was given instructions regarding seed removal. IMPRESSION: Radioactive seed localization left breast. No apparent complications. Electronically Signed   By: Gerome Sam III M.D.   On: 05/25/2022 10:24   Impression:  Recent diagnosis of Left Breast UIQ, Invasive and in situ lobular carcinoma, ER+ / PR+ / Her2-, Grade 1: s/p lumpectomy and currently on antiestrogen therapy   ***  Plan:  Patient is scheduled for CT simulation {date/later today}. ***  -----------------------------------  Billie Lade, PhD, MD  This document serves as a record of services personally performed by Antony Blackbird, MD. It was created on his behalf by Neena Rhymes, a trained medical scribe. The creation of this record is based on the scribe's personal observations and the provider's statements to them. This document has been checked and approved by the attending provider.

## 2022-06-21 ENCOUNTER — Ambulatory Visit
Admission: RE | Admit: 2022-06-21 | Discharge: 2022-06-21 | Disposition: A | Discharge status: Home / Self Care | Payer: 59 | Source: Ambulatory Visit | Attending: Radiation Oncology | Admitting: Radiation Oncology

## 2022-06-21 VITALS — BP 151/78 | HR 92 | Temp 97.5°F | Resp 18 | Wt 241.8 lb

## 2022-06-21 DIAGNOSIS — Z170 Estrogen receptor positive status [ER+]: Secondary | ICD-10-CM | POA: Diagnosis present

## 2022-06-21 DIAGNOSIS — Z79890 Hormone replacement therapy (postmenopausal): Secondary | ICD-10-CM | POA: Diagnosis not present

## 2022-06-21 DIAGNOSIS — Z923 Personal history of irradiation: Secondary | ICD-10-CM | POA: Diagnosis not present

## 2022-06-21 DIAGNOSIS — C50412 Malignant neoplasm of upper-outer quadrant of left female breast: Secondary | ICD-10-CM | POA: Diagnosis not present

## 2022-06-21 DIAGNOSIS — Z79899 Other long term (current) drug therapy: Secondary | ICD-10-CM | POA: Diagnosis not present

## 2022-06-21 DIAGNOSIS — Z79811 Long term (current) use of aromatase inhibitors: Secondary | ICD-10-CM | POA: Diagnosis not present

## 2022-06-21 DIAGNOSIS — C50411 Malignant neoplasm of upper-outer quadrant of right female breast: Secondary | ICD-10-CM | POA: Insufficient documentation

## 2022-06-21 DIAGNOSIS — C50212 Malignant neoplasm of upper-inner quadrant of left female breast: Secondary | ICD-10-CM | POA: Diagnosis present

## 2022-06-22 ENCOUNTER — Telehealth: Payer: Self-pay | Admitting: *Deleted

## 2022-06-22 MED ORDER — FOLIC ACID 1 MG PO TABS: 1.0000 mg | ORAL_TABLET | Freq: Every morning | ORAL | 0 refills | Status: AC

## 2022-06-22 NOTE — Telephone Encounter (Signed)
Received call from pt with complaint of indigestion while taking Letrozole.  Pt states in the past she experienced indigestion with her RA medications and folic acid helped tremendously.  Per MD okay to send in prescription for folic acid 1 mg p.o daily and f/u in 3 weeks. Prescription sent to pharmacy on file, pt educated and verbalized understanding.

## 2022-06-23 ENCOUNTER — Telehealth: Payer: Self-pay | Admitting: Hematology and Oncology

## 2022-06-23 NOTE — Telephone Encounter (Signed)
Scheduled appointment staff message. Patient is aware of the made appointment.

## 2022-06-28 ENCOUNTER — Encounter: Payer: Self-pay | Admitting: *Deleted

## 2022-06-28 DIAGNOSIS — Z170 Estrogen receptor positive status [ER+]: Secondary | ICD-10-CM

## 2022-06-29 ENCOUNTER — Ambulatory Visit (HOSPITAL_BASED_OUTPATIENT_CLINIC_OR_DEPARTMENT_OTHER): Admission: RE | Admit: 2022-06-29 | Payer: 59 | Source: Ambulatory Visit

## 2022-06-29 DIAGNOSIS — C50212 Malignant neoplasm of upper-inner quadrant of left female breast: Secondary | ICD-10-CM | POA: Diagnosis not present

## 2022-07-01 ENCOUNTER — Other Ambulatory Visit: Payer: Self-pay | Admitting: Family Medicine

## 2022-07-01 DIAGNOSIS — E039 Hypothyroidism, unspecified: Secondary | ICD-10-CM

## 2022-07-06 ENCOUNTER — Telehealth: Payer: Self-pay

## 2022-07-06 ENCOUNTER — Ambulatory Visit: Payer: 59 | Admitting: Radiation Oncology

## 2022-07-06 NOTE — Telephone Encounter (Signed)
I spoke with Margaret Beasley earlier today concerning her decision not to proceed with radiation treatment.  She has also recently stopped taking letrozole secondary to side effects with this medication.  She understands that she will be at increased risk for recurrence without the benefit of radiation therapy or adjuvant hormonal therapy but she is comfortable with this decision.  She plans on continuing follow-up with Dr. [name].  -----------------------------------  Billie Lade, PhD, MD

## 2022-07-06 NOTE — Telephone Encounter (Signed)
Patient called in stating she decided not start radiation treatment. Patient states, "I know my cancer will return even with the radiation because I can't tolerate my hormone replacement therapy I am at peace with the decision that I have made. I am very thankful for all the care that I have received at the Cancer center."  Made patient aware that if she changed her mind that we would always be here. Patient to call with any issues.

## 2022-07-07 ENCOUNTER — Ambulatory Visit: Payer: 59

## 2022-07-08 ENCOUNTER — Ambulatory Visit: Payer: 59

## 2022-07-09 ENCOUNTER — Ambulatory Visit: Payer: 59

## 2022-07-13 ENCOUNTER — Ambulatory Visit: Payer: 59

## 2022-07-13 ENCOUNTER — Telehealth: Payer: Self-pay

## 2022-07-13 ENCOUNTER — Other Ambulatory Visit (HOSPITAL_BASED_OUTPATIENT_CLINIC_OR_DEPARTMENT_OTHER): Payer: 59

## 2022-07-13 NOTE — Telephone Encounter (Signed)
Pt called to cancel 08/02/22 MD appt. Pt states she is "done" and "will not be taking any more medication. Pt reports she stopped letrozole mid April and is declining radiation. Pt adamant about cancelling MD appt. Advised Pt that we are here when she needs [place] and to call if she changes her mind or has any questions. Pt verbalized understanding.

## 2022-07-14 ENCOUNTER — Ambulatory Visit: Payer: 59

## 2022-07-15 ENCOUNTER — Ambulatory Visit: Payer: 59

## 2022-07-16 ENCOUNTER — Ambulatory Visit: Payer: 59

## 2022-07-19 ENCOUNTER — Encounter: Payer: Self-pay | Admitting: *Deleted

## 2022-07-19 ENCOUNTER — Ambulatory Visit: Payer: 59

## 2022-07-20 ENCOUNTER — Ambulatory Visit: Payer: 59

## 2022-07-21 ENCOUNTER — Ambulatory Visit: Payer: 59

## 2022-07-22 ENCOUNTER — Ambulatory Visit: Payer: 59

## 2022-07-23 ENCOUNTER — Ambulatory Visit: Payer: 59

## 2022-07-26 ENCOUNTER — Ambulatory Visit: Payer: 59

## 2022-07-27 ENCOUNTER — Ambulatory Visit: Payer: 59

## 2022-07-28 ENCOUNTER — Ambulatory Visit: Payer: 59

## 2022-07-31 ENCOUNTER — Other Ambulatory Visit: Payer: Self-pay | Admitting: Family Medicine

## 2022-08-02 ENCOUNTER — Ambulatory Visit: Payer: 59 | Admitting: Hematology and Oncology

## 2022-08-02 NOTE — Telephone Encounter (Signed)
Patient is requesting refills on Cymbalta 30mg   [facility] [address] [phone]

## 2022-08-04 ENCOUNTER — Telehealth: Payer: Self-pay | Admitting: *Deleted

## 2022-08-04 NOTE — Telephone Encounter (Signed)
Received call from pt stating she has declined radiation therapy and stopped her letrozole in April.  Pt states she is now experiencing right lower right sided back pain x 1 week.  Pt states she was seen by her rheumatologist today who is going to obtain xrays but instructed pt to f/u with our office.  Appt scheduled, pt notified and verbalized understanding.

## 2022-08-17 ENCOUNTER — Other Ambulatory Visit: Payer: Self-pay

## 2022-08-17 ENCOUNTER — Inpatient Hospital Stay: Payer: 59 | Attending: Hematology and Oncology | Admitting: Hematology and Oncology

## 2022-08-17 VITALS — BP 142/66 | HR 84 | Temp 97.7°F | Resp 18 | Ht 63.0 in | Wt 246.4 lb

## 2022-08-17 DIAGNOSIS — M549 Dorsalgia, unspecified: Secondary | ICD-10-CM | POA: Insufficient documentation

## 2022-08-17 DIAGNOSIS — Z79811 Long term (current) use of aromatase inhibitors: Secondary | ICD-10-CM | POA: Insufficient documentation

## 2022-08-17 DIAGNOSIS — Z170 Estrogen receptor positive status [ER+]: Secondary | ICD-10-CM | POA: Insufficient documentation

## 2022-08-17 DIAGNOSIS — C50411 Malignant neoplasm of upper-outer quadrant of right female breast: Secondary | ICD-10-CM | POA: Insufficient documentation

## 2022-08-17 DIAGNOSIS — C50212 Malignant neoplasm of upper-inner quadrant of left female breast: Secondary | ICD-10-CM | POA: Diagnosis not present

## 2022-08-17 MED ORDER — LETROZOLE 2.5 MG PO TABS: 2.5000 mg | ORAL_TABLET | Freq: Every day | ORAL | 3 refills | Status: DC

## 2022-08-17 NOTE — Progress Notes (Signed)
Patient Care Team: [name], MD as PCP - General (Family Medicine) Paulino Rily (Inactive) as Referring Physician (Neurology) Pershing Proud, RN as Oncology Nurse Navigator Donnelly Angelica, RN as Oncology Nurse Navigator Harriette Bouillon, MD as Consulting Physician (General Surgery) [name], MD as Consulting Physician (Hematology and Oncology) Antony Blackbird, MD as Consulting Physician (Radiation Oncology)  DIAGNOSIS:  Encounter Diagnoses  Name Primary?   Malignant neoplasm of upper-outer quadrant of right breast in female, estrogen receptor positive (HCC) Yes   Malignant neoplasm of upper-inner quadrant of left breast in female, estrogen receptor positive (HCC)     SUMMARY OF ONCOLOGIC HISTORY: Oncology History  Malignant neoplasm of upper-outer quadrant of right breast in female, estrogen receptor positive (HCC)  05/21/2020 Initial Diagnosis   Screening mammogram showed a possible mass in the right breast. Diagnostic mammogram and US showed a 0.5cm mass at the 12 o'clock position in the right breast, with no right axillary abnormalities. Biopsy showed invasive and in situ ductal carcinoma, grade 1, HER-2 equivocal by IHC, negative by FISH (ratio 1.22), ER+ 95%, PR+ 90%, Ki67 10%.    05/28/2020 Cancer Staging   Staging form: Breast, AJCC 8th Edition - Clinical stage from 05/28/2020: Stage IA (cT1a, cN0, cM0, G2, ER+, PR+, HER2-) - Signed by [name], MD on 05/28/2020 Stage prefix: Initial diagnosis Method of lymph node assessment: Clinical Histologic grading system: 3 grade system   06/04/2020 Genetic Testing   Negative hereditary cancer genetic testing: no pathogenic variants detected in [facility] Multi-Cancer Panel.  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  The report date is June 04, 2020.   The Multi-Cancer Panel offered by [facility] includes sequencing and/or deletion duplication testing of the following 84 genes: AIP,  ALK, APC, ATM, AXIN2,BAP1,  BARD1, BLM, BMPR1A, BRCA1, BRCA2, BRIP1, CASR, CDC73, CDH1, CDK4, CDKN1B, CDKN1C, CDKN2A (p14ARF), CDKN2A (p16INK4a), CEBPA, CHEK2, CTNNA1, DICER1, DIS3L2, EGFR (c.2369C>T, p.Thr790Met variant only), EPCAM (Deletion/duplication testing only), FH, FLCN, GATA2, GPC3, GREM1 (Promoter region deletion/duplication testing only), HOXB13 (c.251G>A, p.Gly84Glu), HRAS, KIT, MAX, MEN1, MET, MITF (c.952G>A, p.Glu318Lys variant only), MLH1, MSH2, MSH3, MSH6, MUTYH, NBN, NF1, NF2, NTHL1, PALB2, PDGFRA, PHOX2B, PMS2, POLD1, POLE, POT1, PRKAR1A, PTCH1, PTEN, RAD50, RAD51C, RAD51D, RB1, RECQL4, RET, RUNX1, SDHAF2, SDHA (sequence changes only), SDHB, SDHC, SDHD, SMAD4, SMARCA4, SMARCB1, SMARCE1, STK11, SUFU, TERC, TERT, TMEM127, TP53, TSC1, TSC2, VHL, WRN and WT1.    06/26/2020 Surgery   Right lumpectomy ([facility]): IDC, grade 1, 0.2cm, with intermediate grade DCIS, and 2 right axillary lymph nodes negative for carcinoma.   Malignant neoplasm of upper-inner quadrant of left breast in female, estrogen receptor positive (HCC)  04/02/2022 Initial Diagnosis   Suspicious 0.9 cm mass left breast 10 o'clock position, indeterminate 1.5 cm group of calcifications UIQ left breast: Mass and calcifications span 3.7 cm.  No axillary lymph node, biopsy: Grade 1 IDC with DCIS ER 100%, PR 60%, Ki-67 5%, HER2 2+ by IHC FISH negative ratio 1.34, left UIQ biopsy: Low-grade DCIS ER 95%, PR 80%   05/27/2022 Surgery   Left Lumpectomy: 0.7 cm Grade 1 ILC with LCIS, LG DCIS, Margins Negative, 0/5 LN Neg, ER 100%, PR: 60%, Her 2 : neg, Ki 67: 5%     CHIEF COMPLIANT: Follow-up pain in back  INTERVAL HISTORY: Margaret Beasley is a  67 y.o. female with above-mentioned history of right breast cancer having undergone lumpectomy, currently undergoing radiation therapy. Currently on letrozole. She reports today for follow-up. Pt could not tolerate  the letrozole after doing very well for the first 3 weeks because she has  certainly had a intense pain in the back to when she was climbing up stairs.  Her rheumatologist wanted her to follow-up with [place] to make sure there is no evidence of metastatic disease.   ALLERGIES:  is allergic to opium, topiramate, benadryl [diphenhydramine hcl], butorphanol, celecoxib, latex, penicillins, arimidex [anastrozole], diphenhydramine-acetaminophen, gabapentin, oxycodone-acetaminophen, oxycodone-acetaminophen, requip [ropinirole], stadol [butorphanol tartrate], topamax, and tramadol.  MEDICATIONS:  Current Outpatient Medications  Medication Sig Dispense Refill   methotrexate (RHEUMATREX) 2.5 MG tablet Take by mouth.     acetaminophen (TYLENOL) 650 MG CR tablet Take 650-1,300 mg by mouth daily as needed (pain.).     DULoxetine (CYMBALTA) 30 MG capsule TAKE 3 CAPSULES BY MOUTH EVERY DAY 270 capsule 1   folic acid (FOLVITE) 1 MG tablet Take 1 tablet (1 mg total) by mouth in the morning. 30 tablet 0   HYDROcodone-acetaminophen (NORCO/VICODIN) 5-325 MG tablet Take 1 tablet by mouth every 6 (six) hours as needed for moderate pain. 15 tablet 0   HYDROcodone-acetaminophen (NORCO/VICODIN) 5-325 MG tablet Take 1 tablet by mouth every 6 (six) hours as needed for moderate pain. 15 tablet 0   lamoTRIgine (LAMICTAL) 100 MG tablet Take 200 mg by mouth at bedtime.     letrozole (FEMARA) 2.5 MG tablet Take 1 tablet (2.5 mg total) by mouth at bedtime. 90 tablet 3   levETIRAcetam (KEPPRA XR) 500 MG 24 hr tablet Take 1,000 mg by mouth in the morning and at bedtime.     levothyroxine (SYNTHROID) 50 MCG tablet TAKE 1 TABLET BY MOUTH DAILY BEFORE BREAKFAST 90 tablet 1   losartan (COZAAR) 50 MG tablet TAKE 1 TABLET BY MOUTH EVERY DAY 90 tablet 0   promethazine (PHENERGAN) 25 MG suppository Place 1 suppository (25 mg total) rectally every 6 (six) hours as needed for nausea or vomiting. 12 each 2   simvastatin (ZOCOR) 40 MG tablet TAKE 1 TABLET BY MOUTH EVERYDAY AT BEDTIME 90 tablet 3   SUMAtriptan (IMITREX)  100 MG tablet TAKE 1 TABLET (100 MG TOTAL) BY MOUTH EVERY 2 (TWO) HOURS AS NEEDED FOR MIGRAINE. 30 tablet 3   No current facility-administered medications for this visit.    PHYSICAL EXAMINATION: ECOG PERFORMANCE STATUS: 1 - Symptomatic but completely ambulatory  Vitals:   08/17/22 1027  BP: (!) 142/66  Pulse: 84  Resp: 18  Temp: 97.7 F (36.5 C)  SpO2: 97%   Filed Weights   08/17/22 1027  Weight: 246 lb 6.4 oz (111.8 kg)     LABORATORY DATA:  I have reviewed the data as listed    Latest Ref Rng & Units 05/19/2022    3:00 PM 03/04/2022    3:19 PM 12/10/2021   12:00 AM  CMP  Glucose 70 - 99 mg/dL 88  84  95   BUN 8 - 23 mg/dL 12  10  7    Creatinine 0.44 - 1.00 mg/dL 2.95  6.21  3.08   Sodium 135 - 145 mmol/L 138  140  139   Potassium 3.5 - 5.1 mmol/L 5.6  4.9  5.0   Chloride 98 - 111 mmol/L 101  102  105   CO2 22 - 32 mmol/L 29  32  30   Calcium 8.9 - 10.3 mg/dL 9.4  65.7  8.8   Total Protein 6.5 - 8.1 g/dL  7.0  6.2   Total Bilirubin 0.3 - 1.2 mg/dL  0.5  0.4  Alkaline Phos 38 - 126 U/L  69    AST 15 - 41 U/L  22  18   ALT 0 - 44 U/L  21  19     Lab Results  Component Value Date   WBC 5.8 05/19/2022   HGB 14.8 05/19/2022   HCT 46.6 (H) 05/19/2022   MCV 100.4 (H) 05/19/2022   PLT 232 05/19/2022   NEUTROABS 2.6 03/04/2022    ASSESSMENT & PLAN:  Malignant neoplasm of upper-outer quadrant of right breast in female, estrogen receptor positive (HCC) 05/21/2020: Screening mammogram showed a possible mass in the right breast. Diagnostic mammogram and US showed a 0.5cm mass at the 12 o'clock position in the right breast, with no right axillary abnormalities. Biopsy showed invasive and in situ ductal carcinoma, grade 1, HER-2 equivocal by IHC, negative by FISH (ratio 1.22), ER+ 95%, PR+ 90%, Ki67 10%.    Treatment Summary 1. 06/26/20: Rt Lumpectomy: Grade 1 IDC with DCIS 0/2 LN neg, Margins neg, ER+ 95%, PR+ 90%, Ki67 10%. , Her 2 Neg 2. Adjuvant XRT: completed  09/24/2020 3. Followed by Adj Anti estrogen therapy (patient stopped after taking 1 tablet because of nausea)  Malignant neoplasm of upper-inner quadrant of left breast in female, estrogen receptor positive (HCC) 04/02/2022: Suspicious 0.9 cm mass left breast 10 o'clock position, indeterminate 1.5 cm group of calcifications UIQ left breast: Mass and calcifications span 3.7 cm.  No axillary lymph node, biopsy: Grade 1 IDC with DCIS ER 100%, PR 60%, Ki-67 5%, HER2 2+ by IHC FISH negative ratio 1.34, left UIQ biopsy: Low-grade DCIS ER 95%, PR 80%    05/27/22: Left Lumpectomy: 0.7 cm Grade 1 ILC with LCIS, LG DCIS, Margins Negative, 0/5 LN Neg, ER 100%, PR: 60%, Her 2 : neg, Ki 67: 5%  Patient did not want to do radiation. Letrozole toxicities: She could not tolerate anastrozole so we switched her to letrozole.  On letrozole she had done well for the first 3 weeks and then started to have sharp pain in the back when she was climbing stairs and then therefore she stopped that as well.  Back pain issues: Will obtain CT chest abdomen pelvis for further evaluation. Telephone visit 3 years after that to discuss results.    Orders Placed This Encounter  Procedures   CT CHEST ABDOMEN PELVIS W CONTRAST    Iv contrast    Standing Status:   Future    Standing Expiration Date:   08/17/2023    Order Specific Question:   If indicated for the ordered procedure, I authorize the administration of contrast media per Radiology protocol    Answer:   Yes    Order Specific Question:   Does the patient have a contrast media/X-ray dye allergy?    Answer:   No    Order Specific Question:   Preferred imaging location?    Answer:   [facility]    Order Specific Question:   If indicated for the ordered procedure, I authorize the administration of oral contrast media per Radiology protocol    Answer:   Yes   The patient has a good understanding of the overall plan. she agrees with it. she will call with any  problems that may develop before the next visit here. Total time spent: 30 mins including face to face time and time spent for planning, charting and co-ordination of care   Tamsen Meek, MD 08/17/22    I Janan Ridge am acting as a  scribe for Dr.Rennae Ferraiolo Lindi Adie  I have reviewed the above documentation for accuracy and completeness, and I agree with the above.

## 2022-08-17 NOTE — Assessment & Plan Note (Signed)
04/02/2022: Suspicious 0.9 cm mass left breast 10 o'clock position, indeterminate 1.5 cm group of calcifications UIQ left breast: Mass and calcifications span 3.7 cm.  No axillary lymph node, biopsy: Grade 1 IDC with DCIS ER 100%, PR 60%, Ki-67 5%, HER2 2+ by IHC FISH negative ratio 1.34, left UIQ biopsy: Low-grade DCIS ER 95%, PR 80%    05/27/22: Left Lumpectomy: 0.7 cm Grade 1 ILC with LCIS, LG DCIS, Margins Negative, 0/5 LN Neg, ER 100%, PR: 60%, Her 2 : neg, Ki 67: 5%  Patient did not want to do radiation. Letrozole toxicities: She could not tolerate anastrozole so we switched her to letrozole.  On letrozole she had done well for the first 3 weeks and then started to have sharp pain in the back when she was climbing stairs and then therefore she stopped that as well.  Back pain issues: Will obtain CT chest abdomen pelvis for further evaluation. Telephone visit 3 years after that to discuss results.

## 2022-08-17 NOTE — Assessment & Plan Note (Signed)
05/21/2020: Screening mammogram showed a possible mass in the right breast. Diagnostic mammogram and US showed a 0.5cm mass at the 12 o'clock position in the right breast, with no right axillary abnormalities. Biopsy showed invasive and in situ ductal carcinoma, grade 1, HER-2 equivocal by IHC, negative by FISH (ratio 1.22), ER+ 95%, PR+ 90%, Ki67 10%.    Treatment Summary 1. 06/26/20: Rt Lumpectomy: Grade 1 IDC with DCIS 0/2 LN neg, Margins neg, ER+ 95%, PR+ 90%, Ki67 10%. , Her 2 Neg 2. Adjuvant XRT: completed 09/24/2020 3. Followed by Adj Anti estrogen therapy (patient stopped after taking 1 tablet because of nausea) 

## 2022-08-18 ENCOUNTER — Telehealth: Payer: Self-pay | Admitting: Hematology and Oncology

## 2022-08-18 NOTE — Telephone Encounter (Signed)
Scheduled appointment per los. Patient is aware of the made appointment. 

## 2022-08-23 ENCOUNTER — Ambulatory Visit: Payer: 59

## 2022-08-23 DIAGNOSIS — C50411 Malignant neoplasm of upper-outer quadrant of right female breast: Secondary | ICD-10-CM

## 2022-08-23 DIAGNOSIS — Z170 Estrogen receptor positive status [ER+]: Secondary | ICD-10-CM

## 2022-08-23 MED ORDER — IOHEXOL 300 MG/ML  SOLN
100.0000 mL | Freq: Once | INTRAMUSCULAR | Status: AC | PRN
  Administered 2022-08-23: 100 mL via INTRAVENOUS

## 2022-08-26 NOTE — Progress Notes (Signed)
HEMATOLOGY-ONCOLOGY TELEPHONE VISIT PROGRESS NOTE  I connected with our patient on 08/27/22 at  9:45 AM EDT by telephone and verified that I am speaking with the correct person using two identifiers.  I discussed the limitations, risks, security and privacy concerns of performing an evaluation and management service by telephone and the availability of in person appointments.  I also discussed with the patient that there may be a patient responsible charge related to this service. The patient expressed understanding and agreed to proceed.   History of Present Illness: Margaret Beasley is a 67 y.o. female with above-mentioned history of right breast cancer having undergone lumpectomy, currently undergoing radiation therapy. Currently on letrozole. She reports today for a telephone follow-up to discuss ct scans.  She is complaining of persistent mid upper back pain that is not relieving with anything.  She ran out of her pain medications and is in excruciating pain.  Oncology History  Malignant neoplasm of upper-outer quadrant of right breast in female, estrogen receptor positive (HCC)  05/21/2020 Initial Diagnosis   Screening mammogram showed a possible mass in the right breast. Diagnostic mammogram and US showed a 0.5cm mass at the 12 o'clock position in the right breast, with no right axillary abnormalities. Biopsy showed invasive and in situ ductal carcinoma, grade 1, HER-2 equivocal by IHC, negative by FISH (ratio 1.22), ER+ 95%, PR+ 90%, Ki67 10%.    05/28/2020 Cancer Staging   Staging form: Breast, AJCC 8th Edition - Clinical stage from 05/28/2020: Stage IA (cT1a, cN0, cM0, G2, ER+, PR+, HER2-) - Signed by [name], MD on 05/28/2020 Stage prefix: Initial diagnosis Method of lymph node assessment: Clinical Histologic grading system: 3 grade system   06/04/2020 Genetic Testing   Negative hereditary cancer genetic testing: no pathogenic variants detected in [facility] Multi-Cancer Panel.  Variant  of uncertain significance detected in [facility]IT at c.454C>G (p.Gln152Glu).  The report date is June 04, 2020.   The Multi-Cancer Panel offered by [facility] includes sequencing and/or deletion duplication testing of the following 84 genes: AIP, ALK, APC, ATM, AXIN2,BAP1,  BARD1, BLM, BMPR1A, BRCA1, BRCA2, BRIP1, CASR, CDC73, CDH1, CDK4, CDKN1B, CDKN1C, CDKN2A (p14ARF), CDKN2A (p16INK4a), CEBPA, CHEK2, CTNNA1, DICER1, DIS3L2, EGFR (c.2369C>T, p.Thr790Met variant only), EPCAM (Deletion/duplication testing only), FH, FLCN, GATA2, GPC3, GREM1 (Promoter region deletion/duplication testing only), HOXB13 (c.251G>A, p.Gly84Glu), HRAS, KIT, MAX, MEN1, MET, MITF (c.952G>A, p.Glu318Lys variant only), MLH1, MSH2, MSH3, MSH6, MUTYH, NBN, NF1, NF2, NTHL1, PALB2, PDGFRA, PHOX2B, PMS2, POLD1, POLE, POT1, PRKAR1A, PTCH1, PTEN, RAD50, RAD51C, RAD51D, RB1, RECQL4, RET, RUNX1, SDHAF2, SDHA (sequence changes only), SDHB, SDHC, SDHD, SMAD4, SMARCA4, SMARCB1, SMARCE1, STK11, SUFU, TERC, TERT, TMEM127, TP53, TSC1, TSC2, VHL, WRN and WT1.    06/26/2020 Surgery   Right lumpectomy ([facility]): IDC, grade 1, 0.2cm, with intermediate grade DCIS, and 2 right axillary lymph nodes negative for carcinoma.   Malignant neoplasm of upper-inner quadrant of left breast in female, estrogen receptor positive (HCC)  04/02/2022 Initial Diagnosis   Suspicious 0.9 cm mass left breast 10 o'clock position, indeterminate 1.5 cm group of calcifications UIQ left breast: Mass and calcifications span 3.7 cm.  No axillary lymph node, biopsy: Grade 1 IDC with DCIS ER 100%, PR 60%, Ki-67 5%, HER2 2+ by IHC FISH negative ratio 1.34, left UIQ biopsy: Low-grade DCIS ER 95%, PR 80%   05/27/2022 Surgery   Left Lumpectomy: 0.7 cm Grade 1 ILC with LCIS, LG DCIS, Margins Negative, 0/5 LN Neg, ER 100%, PR: 60%, Her 2 : neg, Ki 67: 5%  REVIEW OF SYSTEMS:   Constitutional: Denies fevers, chills or abnormal weight loss All other systems were reviewed with the patient and  are negative. Observations/Objective:     Assessment Plan:  Malignant neoplasm of upper-outer quadrant of right breast in female, estrogen receptor positive (HCC) 05/21/2020: Screening mammogram showed a possible mass in the right breast. Diagnostic mammogram and US showed a 0.5cm mass at the 12 o'clock position in the right breast, with no right axillary abnormalities. Biopsy showed invasive and in situ ductal carcinoma, grade 1, HER-2 equivocal by IHC, negative by FISH (ratio 1.22), ER+ 95%, PR+ 90%, Ki67 10%.    Treatment Summary 1. 06/26/20: Rt Lumpectomy: Grade 1 IDC with DCIS 0/2 LN neg, Margins neg, ER+ 95%, PR+ 90%, Ki67 10%. , Her 2 Neg 2. Adjuvant XRT: completed 09/24/2020 3. Followed by Adj Anti estrogen therapy (patient stopped after taking 1 tablet because of nausea)  Malignant neoplasm of upper-inner quadrant of left breast in female, estrogen receptor positive (HCC) 04/02/2022: Suspicious 0.9 cm mass left breast 10 o'clock position, indeterminate 1.5 cm group of calcifications UIQ left breast: Mass and calcifications span 3.7 cm.  No axillary lymph node, biopsy: Grade 1 IDC with DCIS ER 100%, PR 60%, Ki-67 5%, HER2 2+ by IHC FISH negative ratio 1.34, left UIQ biopsy: Low-grade DCIS ER 95%, PR 80%    05/27/22: Left Lumpectomy: 0.7 cm Grade 1 ILC with LCIS, LG DCIS, Margins Negative, 0/5 LN Neg, ER 100%, PR: 60%, Her 2 : neg, Ki 67: 5%   Patient did not want to do radiation. Letrozole toxicities: (Patient could not tolerate anastrozole so we switched to letrozole, started having sharp pain in the back and she discontinued letrozole as well)  CT CAP 08/23/2022: No evidence of metastatic disease Back pain: I will obtain MRI lumbar spine with and without contrast for further evaluation. I instructed her to stop letrozole. Reassess her after the MRI to discuss results. I sent in a prescription for pain medication.  I discussed the assessment and treatment plan with the patient. The  patient was provided an opportunity to ask questions and all were answered. The patient agreed with the plan and demonstrated an understanding of the instructions. The patient was advised to call back or seek an in-person evaluation if the symptoms worsen or if the condition fails to improve as anticipated.   I provided 12 minutes of non-face-to-face time during this encounter.  This includes time for charting and coordination of care   Tamsen Meek, MD  I Janan Ridge am acting as a scribe for Dr.Deondrea Aguado  I have reviewed the above documentation for accuracy and completeness, and I agree with the above.

## 2022-08-27 ENCOUNTER — Inpatient Hospital Stay (HOSPITAL_BASED_OUTPATIENT_CLINIC_OR_DEPARTMENT_OTHER): Payer: 59 | Admitting: Hematology and Oncology

## 2022-08-27 DIAGNOSIS — C50411 Malignant neoplasm of upper-outer quadrant of right female breast: Secondary | ICD-10-CM

## 2022-08-27 DIAGNOSIS — Z170 Estrogen receptor positive status [ER+]: Secondary | ICD-10-CM

## 2022-08-27 DIAGNOSIS — C50212 Malignant neoplasm of upper-inner quadrant of left female breast: Secondary | ICD-10-CM

## 2022-08-27 DIAGNOSIS — M546 Pain in thoracic spine: Secondary | ICD-10-CM

## 2022-08-27 MED ORDER — HYDROCODONE-ACETAMINOPHEN 5-325 MG PO TABS: 1.0000 | ORAL_TABLET | Freq: Four times a day (QID) | ORAL | 0 refills | Status: DC | PRN

## 2022-08-27 NOTE — Assessment & Plan Note (Signed)
04/02/2022: Suspicious 0.9 cm mass left breast 10 o'clock position, indeterminate 1.5 cm group of calcifications UIQ left breast: Mass and calcifications span 3.7 cm.  No axillary lymph node, biopsy: Grade 1 IDC with DCIS ER 100%, PR 60%, Ki-67 5%, HER2 2+ by IHC FISH negative ratio 1.34, left UIQ biopsy: Low-grade DCIS ER 95%, PR 80%    05/27/22: Left Lumpectomy: 0.7 cm Grade 1 ILC with LCIS, LG DCIS, Margins Negative, 0/5 LN Neg, ER 100%, PR: 60%, Her 2 : neg, Ki 67: 5%   Patient did not want to do radiation. Letrozole toxicities: (Patient could not tolerate anastrozole so we will switch to anastrozole, started having sharp pain in the back and she discontinued letrozole as well)  CT CAP 08/23/2022: No evidence of metastatic disease I discussed with her about restarting letrozole or switching to exemestane.

## 2022-08-27 NOTE — Assessment & Plan Note (Signed)
05/21/2020: Screening mammogram showed a possible mass in the right breast. Diagnostic mammogram and US showed a 0.5cm mass at the 12 o'clock position in the right breast, with no right axillary abnormalities. Biopsy showed invasive and in situ ductal carcinoma, grade 1, HER-2 equivocal by IHC, negative by FISH (ratio 1.22), ER+ 95%, PR+ 90%, Ki67 10%.    Treatment Summary 1. 06/26/20: Rt Lumpectomy: Grade 1 IDC with DCIS 0/2 LN neg, Margins neg, ER+ 95%, PR+ 90%, Ki67 10%. , Her 2 Neg 2. Adjuvant XRT: completed 09/24/2020 3. Followed by Adj Anti estrogen therapy (patient stopped after taking 1 tablet because of nausea) 

## 2022-08-31 ENCOUNTER — Telehealth: Payer: Self-pay | Admitting: Hematology and Oncology

## 2022-08-31 ENCOUNTER — Encounter: Payer: Self-pay | Admitting: Family Medicine

## 2022-08-31 ENCOUNTER — Ambulatory Visit: Payer: 59 | Admitting: Family Medicine

## 2022-08-31 VITALS — BP 130/72 | HR 86 | Ht 63.0 in | Wt 246.0 lb

## 2022-08-31 DIAGNOSIS — I10 Essential (primary) hypertension: Secondary | ICD-10-CM

## 2022-08-31 DIAGNOSIS — E618 Deficiency of other specified nutrient elements: Secondary | ICD-10-CM

## 2022-08-31 DIAGNOSIS — Z0000 Encounter for general adult medical examination without abnormal findings: Secondary | ICD-10-CM

## 2022-08-31 DIAGNOSIS — D539 Nutritional anemia, unspecified: Secondary | ICD-10-CM | POA: Diagnosis not present

## 2022-08-31 DIAGNOSIS — E538 Deficiency of other specified B group vitamins: Secondary | ICD-10-CM

## 2022-08-31 DIAGNOSIS — E875 Hyperkalemia: Secondary | ICD-10-CM | POA: Diagnosis not present

## 2022-08-31 DIAGNOSIS — Z23 Encounter for immunization: Secondary | ICD-10-CM | POA: Diagnosis not present

## 2022-08-31 NOTE — Progress Notes (Signed)
Complete physical exam  Patient: Margaret Beasley   DOB: [birth year]   67 y.o. Female  MRN: [id number]  Subjective:    No chief complaint on file.   Margaret Beasley is a 66 y.o. female who presents today for a complete physical exam. She reports consuming a general diet. The patient does not participate in regular exercise at present. She generally feels fairly well.  She does have additional problems to discuss today.   Has been really struggling with some back pain particularly on that right low back.  She just had a CT so there was no sign of bone metastasis etc.  But they are getting her scheduled for an MRI for further follow-up.  It is painful with certain motions.  Most recent fall risk assessment:    08/31/2022    1:24 PM  Fall Risk   Falls in the past year? 0  Number falls in past yr: 0  Injury with Fall? 0  Follow up Falls evaluation completed     Most recent depression screenings:    08/31/2022    1:24 PM 11/10/2021    8:44 AM  PHQ 2/9 Scores  PHQ - 2 Score 1 0        Patient Care Team: [name], MD as PCP - General (Family Medicine) Paulino Rily (Inactive) as Referring Physician (Neurology) Pershing Proud, RN as Oncology Nurse Navigator Donnelly Angelica, RN as Oncology Nurse Navigator Harriette Bouillon, MD as Consulting Physician (General Surgery) [name], MD as Consulting Physician (Hematology and Oncology) Antony Blackbird, MD as Consulting Physician (Radiation Oncology)   Outpatient Medications Prior to Visit  Medication Sig   acetaminophen (TYLENOL) 650 MG CR tablet Take 650-1,300 mg by mouth daily as needed (pain.).   DULoxetine (CYMBALTA) 30 MG capsule TAKE 3 CAPSULES BY MOUTH EVERY DAY   folic acid (FOLVITE) 1 MG tablet Take 1 tablet (1 mg total) by mouth in the morning.   HYDROcodone-acetaminophen (NORCO/VICODIN) 5-325 MG tablet Take 1 tablet by mouth every 6 (six) hours as needed for moderate pain.   lamoTRIgine (LAMICTAL) 100  MG tablet Take 200 mg by mouth at bedtime.   levETIRAcetam (KEPPRA XR) 500 MG 24 hr tablet Take 1,000 mg by mouth in the morning and at bedtime.   levothyroxine (SYNTHROID) 50 MCG tablet TAKE 1 TABLET BY MOUTH DAILY BEFORE BREAKFAST   losartan (COZAAR) 50 MG tablet TAKE 1 TABLET BY MOUTH EVERY DAY   methotrexate (RHEUMATREX) 2.5 MG tablet Take by mouth.   promethazine (PHENERGAN) 25 MG suppository Place 1 suppository (25 mg total) rectally every 6 (six) hours as needed for nausea or vomiting.   simvastatin (ZOCOR) 40 MG tablet TAKE 1 TABLET BY MOUTH EVERYDAY AT BEDTIME   SUMAtriptan (IMITREX) 100 MG tablet TAKE 1 TABLET (100 MG TOTAL) BY MOUTH EVERY 2 (TWO) HOURS AS NEEDED FOR MIGRAINE.   [DISCONTINUED] letrozole (FEMARA) 2.5 MG tablet Take 1 tablet (2.5 mg total) by mouth at bedtime.   No facility-administered medications prior to visit.    ROS        Objective:     BP 130/72   Pulse 86   Ht 5\' 3"  (1.6 m)   Wt 246 lb (111.6 kg)   SpO2 96%   BMI 43.58 kg/m    Physical Exam Vitals and nursing note reviewed.  Constitutional:      Appearance: She is well-developed.  HENT:     Head: Normocephalic and atraumatic.  Right Ear: Tympanic membrane, ear canal and external ear normal.     Left Ear: Tympanic membrane, ear canal and external ear normal.     Nose: Nose normal.     Mouth/Throat:     Pharynx: Oropharynx is clear.  Eyes:     Conjunctiva/sclera: Conjunctivae normal.     Pupils: Pupils are equal, round, and reactive to light.  Neck:     Thyroid: No thyromegaly.  Cardiovascular:     Rate and Rhythm: Normal rate and regular rhythm.     Heart sounds: Normal heart sounds.  Pulmonary:     Effort: Pulmonary effort is normal.     Breath sounds: Normal breath sounds. No wheezing.  Abdominal:     General: Bowel sounds are normal.     Palpations: Abdomen is soft.  Musculoskeletal:     Cervical back: Neck supple.     Comments: She is tender over that right SI joint.   Lymphadenopathy:     Cervical: No cervical adenopathy.  Skin:    General: Skin is warm and dry.     Coloration: Skin is not pale.  Neurological:     Mental Status: She is alert and oriented to person, place, and time.  Psychiatric:        Behavior: Behavior normal.      No results found for any visits on 08/31/22.     Assessment & Plan:    Routine Health Maintenance and Physical Exam  Immunization History  Administered Date(s) Administered   Influenza,inj,Quad PF,6+ Mos 12/17/2015   Moderna Sars-Covid-2 Vaccination 07/31/2019, 08/31/2019, 10/19/2019   PNEUMOCOCCAL CONJUGATE-20 08/31/2022   Tdap 08/22/2013    Health Maintenance  Topic Date Due   Medicare Annual Wellness (AWV)  Never done   Zoster Vaccines- Shingrix (1 of 2) Never done   Colonoscopy  Never done   COVID-19 Vaccine (4 - 2023-24 season) 10/16/2021   INFLUENZA VACCINE  09/16/2022   DTaP/Tdap/Td (2 - Td or Tdap) 08/23/2023   MAMMOGRAM  04/18/2024   Pneumonia Vaccine 39+ Years old  Completed   DEXA SCAN  Completed   Hepatitis C Screening  Completed   HPV VACCINES  Aged Out    Discussed health benefits of physical activity, and encouraged her to engage in regular exercise appropriate for her age and condition.  Problem List Items Addressed This Visit       Cardiovascular and Mediastinum   Essential hypertension   Relevant Orders   COMPLETE METABOLIC PANEL WITH GFR   CBC with Differential/Platelet   VITAMIN D 25 Hydroxy (Vit-D Deficiency, Fractures)   Lipid Panel w/reflex Direct LDL   Sedimentation rate   C-reactive protein   PTH, Intact and Calcium   Urine Microalbumin w/creat. ratio   B12   Folate   Vitamin B1   Other Visit Diagnoses     Wellness examination    -  Primary   Macrocytic anemia       Relevant Orders   COMPLETE METABOLIC PANEL WITH GFR   CBC with Differential/Platelet   VITAMIN D 25 Hydroxy (Vit-D Deficiency, Fractures)   Lipid Panel w/reflex Direct LDL   Sedimentation  rate   C-reactive protein   PTH, Intact and Calcium   Urine Microalbumin w/creat. ratio   B12   Folate   Vitamin B1   Hyperkalemia       Relevant Orders   COMPLETE METABOLIC PANEL WITH GFR   CBC with Differential/Platelet   VITAMIN D 25 Hydroxy (Vit-D Deficiency, Fractures)  Lipid Panel w/reflex Direct LDL   Sedimentation rate   C-reactive protein   PTH, Intact and Calcium   Urine Microalbumin w/creat. ratio   B12   Folate   Vitamin B1   Low serum vitamin B12       Relevant Orders   COMPLETE METABOLIC PANEL WITH GFR   CBC with Differential/Platelet   VITAMIN D 25 Hydroxy (Vit-D Deficiency, Fractures)   Lipid Panel w/reflex Direct LDL   Sedimentation rate   C-reactive protein   PTH, Intact and Calcium   Urine Microalbumin w/creat. ratio   B12   Folate   Vitamin B1   Mineral deficiency       Relevant Orders   COMPLETE METABOLIC PANEL WITH GFR   CBC with Differential/Platelet   VITAMIN D 25 Hydroxy (Vit-D Deficiency, Fractures)   Lipid Panel w/reflex Direct LDL   Sedimentation rate   C-reactive protein   PTH, Intact and Calcium   Urine Microalbumin w/creat. ratio   B12   Folate   Vitamin B1   Encounter for immunization       Relevant Orders   Pneumococcal conjugate vaccine 20-valent (Completed)       Keep up a regular exercise program and make sure you are eating a healthy diet  Your vaccines are up to date.  Did encourage her to go ahead and get her colonoscopy scheduled as well. Given Prevnar 20.  Right low back pain-SI joint tenderness-she has MRI scheduled.  I do think she would be a good candidate for formal physical therapy so she needs a referral at any point please just let us know.  Return for Hypertension.     Nani Gasser, MD

## 2022-08-31 NOTE — Telephone Encounter (Signed)
 Scheduled appointment per 7/11 los. Patient is aware of the made appointment.

## 2022-09-02 ENCOUNTER — Other Ambulatory Visit: Payer: Self-pay | Admitting: Family Medicine

## 2022-09-02 DIAGNOSIS — I10 Essential (primary) hypertension: Secondary | ICD-10-CM

## 2022-09-02 NOTE — Progress Notes (Signed)
HI Margaret Beasley,  LDL cholesterol jumped up.  It looked great 10 months ago at 78 it is back up to 120.  Are you taking she simvastatin? Did something change? Vitamin D is similar to last year but is dropped just slightly so please make sure you are continuing to take your vitamin D.  If you are taking it consistently then if you could double up we can see if that would help.  Kidney function and liver function are stable.  No excess protein in the urine.  Your PTH hormone level is normal.  Sed rate looks great at 11.  Hemoglobin also is normal.  Your vitamin B12 looks great much better than last time.  Folate levels are great.  CRP which is another inflammatory marker looks great as well.

## 2022-09-03 ENCOUNTER — Telehealth: Payer: Self-pay | Admitting: Family Medicine

## 2022-09-03 NOTE — Telephone Encounter (Signed)
Pt called. She is following up on pcp question about why her cholesterol numbers were high.  Pt states she believes her cholesterol numbers were high because 1. her appointment was in the afternoon and she was not fasting and 2. her cancer Doctor had put her Letrozole for a month and then took her off.

## 2022-09-04 LAB — COMPLETE METABOLIC PANEL WITH GFR
AG Ratio: 1.7 (calc) (ref 1.0–2.5)
ALT: 18 U/L (ref 6–29)
AST: 18 U/L (ref 10–35)
Albumin: 4.5 g/dL (ref 3.6–5.1)
Alkaline phosphatase (APISO): 71 U/L (ref 37–153)
BUN: 12 mg/dL (ref 7–25)
CO2: 31 mmol/L (ref 20–32)
Calcium: 9.9 mg/dL (ref 8.6–10.4)
Chloride: 101 mmol/L (ref 98–110)
Creat: 1.02 mg/dL (ref 0.50–1.05)
Globulin: 2.6 g/dL (calc) (ref 1.9–3.7)
Glucose, Bld: 97 mg/dL (ref 65–99)
Potassium: 4.8 mmol/L (ref 3.5–5.3)
Sodium: 138 mmol/L (ref 135–146)
Total Bilirubin: 0.5 mg/dL (ref 0.2–1.2)
Total Protein: 7.1 g/dL (ref 6.1–8.1)
eGFR: 61 mL/min/{1.73_m2} (ref >=60)

## 2022-09-04 LAB — CBC WITH DIFFERENTIAL/PLATELET
Absolute Monocytes: 594 cells/uL (ref 200–950)
Basophils Absolute: 22 cells/uL (ref 0–200)
Basophils Relative: 0.4 %
Eosinophils Absolute: 171 cells/uL (ref 15–500)
Eosinophils Relative: 3.1 %
HCT: 43.1 % (ref 35.0–45.0)
Hemoglobin: 14.5 g/dL (ref 11.7–15.5)
Lymphs Abs: 1144 cells/uL (ref 850–3900)
MCH: 32.2 pg (ref 27.0–33.0)
MCHC: 33.6 g/dL (ref 32.0–36.0)
MCV: 95.8 fL (ref 80.0–100.0)
MPV: 10.3 fL (ref 7.5–12.5)
Monocytes Relative: 10.8 %
Neutro Abs: 3570 cells/uL (ref 1500–7800)
Neutrophils Relative %: 64.9 %
Platelets: 251 10*3/uL (ref 140–400)
RBC: 4.5 10*6/uL (ref 3.80–5.10)
RDW: 13.2 % (ref 11.0–15.0)
Total Lymphocyte: 20.8 %
WBC: 5.5 10*3/uL (ref 3.8–10.8)

## 2022-09-04 LAB — LIPID PANEL W/REFLEX DIRECT LDL
Cholesterol: 223 mg/dL — ABNORMAL HIGH (ref <200)
HDL: 60 mg/dL (ref >=50)
LDL Cholesterol (Calc): 120 mg/dL (calc) — ABNORMAL HIGH
Non-HDL Cholesterol (Calc): 163 mg/dL (calc) — ABNORMAL HIGH (ref <130)
Total CHOL/HDL Ratio: 3.7 (calc) (ref <5.0)
Triglycerides: 302 mg/dL — ABNORMAL HIGH (ref <150)

## 2022-09-04 LAB — PTH, INTACT AND CALCIUM
Calcium: 9.9 mg/dL (ref 8.6–10.4)
PTH: 48 pg/mL (ref 16–77)

## 2022-09-04 LAB — VITAMIN D 25 HYDROXY (VIT D DEFICIENCY, FRACTURES): Vit D, 25-Hydroxy: 29 ng/mL — ABNORMAL LOW (ref 30–100)

## 2022-09-04 LAB — VITAMIN B12: Vitamin B-12: 466 pg/mL (ref 200–1100)

## 2022-09-04 LAB — FOLATE: Folate: >24 ng/mL

## 2022-09-04 LAB — MICROALBUMIN / CREATININE URINE RATIO
Creatinine, Urine: 78 mg/dL (ref 20–275)
Microalb Creat Ratio: 3 mg/g creat (ref <30)
Microalb, Ur: 0.2 mg/dL

## 2022-09-04 LAB — VITAMIN B1: Vitamin B1 (Thiamine): 13 nmol/L (ref 8–30)

## 2022-09-04 LAB — SEDIMENTATION RATE: Sed Rate: 11 mm/h (ref 0–30)

## 2022-09-04 LAB — C-REACTIVE PROTEIN: CRP: <3 mg/L (ref <8.0)

## 2022-09-06 NOTE — Progress Notes (Signed)
Hi Annett, For the vitamin D recommend 1000 IU / 25 mcg daily.  It sounds like if you are sort of walking to 1 side it sounds like some vertigo.  Work, causes a bit of disorientation and movement and direction.  If persists after the weekend then let us know.  So, vitamin B1, known as thiamine, looks great.  Would love to get you scheduled for your Medicare wellness exam via telephone call with her Medicare wellness nurse,Bableen.  If we can get you scheduled that would be great.

## 2022-09-07 NOTE — Progress Notes (Signed)
Recommend vitamin D 1000 IU / 25 mcg daily.  Does she feel like she is having more vertigo?

## 2022-09-08 NOTE — Telephone Encounter (Signed)
Discussed w pt

## 2022-09-10 ENCOUNTER — Telehealth: Payer: Self-pay

## 2022-09-10 ENCOUNTER — Other Ambulatory Visit: Payer: Self-pay

## 2022-09-10 DIAGNOSIS — M545 Low back pain, unspecified: Secondary | ICD-10-CM

## 2022-09-10 DIAGNOSIS — Z170 Estrogen receptor positive status [ER+]: Secondary | ICD-10-CM

## 2022-09-10 NOTE — Telephone Encounter (Signed)
Pt called to ensure we received message from her PCP regarding her lower back pain and need for lumbar spine. She states her PCP pinpointed her pain to be her right sacroiliac region. S/w radiology who state we need to order MRI for sacrum and joints. Order placed per MD. Windle Guard at [facility] to see if we can have MRI coupled for 7/30. Awaiting response. Pt knows I will be in touch 7/29.

## 2022-09-12 NOTE — Progress Notes (Signed)
HEMATOLOGY-ONCOLOGY TELEPHONE VISIT PROGRESS NOTE  I connected with our patient on 09/17/22 at 12:15 PM EDT by telephone and verified that I am speaking with the correct person using two identifiers.  I discussed the limitations, risks, security and privacy concerns of performing an evaluation and management service by telephone and the availability of in person appointments.  I also discussed with the patient that there may be a patient responsible charge related to this service. The patient expressed understanding and agreed to proceed.   History of Present Illness: Margaret Beasley is a 67 y.o. female with above-mentioned history of right breast cancer having undergone lumpectomy, currently undergoing radiation therapy. Currently on letrozole. She reports today for a telephone follow-up to review scans.  She continues to suffer from chronic back pain issues and she is having such great difficulty with walking.  She follows with rheumatology.    Oncology History  Malignant neoplasm of upper-outer quadrant of right breast in female, estrogen receptor positive (HCC)  05/21/2020 Initial Diagnosis   Screening mammogram showed a possible mass in the right breast. Diagnostic mammogram and US showed a 0.5cm mass at the 12 o'clock position in the right breast, with no right axillary abnormalities. Biopsy showed invasive and in situ ductal carcinoma, grade 1, HER-2 equivocal by IHC, negative by FISH (ratio 1.22), ER+ 95%, PR+ 90%, Ki67 10%.    05/28/2020 Cancer Staging   Staging form: Breast, AJCC 8th Edition - Clinical stage from 05/28/2020: Stage IA (cT1a, cN0, cM0, G2, ER+, PR+, HER2-) - Signed by [name], MD on 05/28/2020 Stage prefix: Initial diagnosis Method of lymph node assessment: Clinical Histologic grading system: 3 grade system   06/04/2020 Genetic Testing   Negative hereditary cancer genetic testing: no pathogenic variants detected in [facility] Multi-Cancer Panel.  Variant of uncertain  significance detected in KIT at c.454C>G (p.Gln152Glu).  The report date is June 04, 2020.   The Multi-Cancer Panel offered by [facility] includes sequencing and/or deletion duplication testing of the following 84 genes: AIP, ALK, APC, ATM, AXIN2,BAP1,  BARD1, BLM, BMPR1A, BRCA1, BRCA2, BRIP1, CASR, CDC73, CDH1, CDK4, CDKN1B, CDKN1C, CDKN2A (p14ARF), CDKN2A (p16INK4a), CEBPA, CHEK2, CTNNA1, DICER1, DIS3L2, EGFR (c.2369C>T, p.Thr790Met variant only), EPCAM (Deletion/duplication testing only), FH, FLCN, GATA2, GPC3, GREM1 (Promoter region deletion/duplication testing only), HOXB13 (c.251G>A, p.Gly84Glu), HRAS, KIT, MAX, MEN1, MET, MITF (c.952G>A, p.Glu318Lys variant only), MLH1, MSH2, MSH3, MSH6, MUTYH, NBN, NF1, NF2, NTHL1, PALB2, PDGFRA, PHOX2B, PMS2, POLD1, POLE, POT1, PRKAR1A, PTCH1, PTEN, RAD50, RAD51C, RAD51D, RB1, RECQL4, RET, RUNX1, SDHAF2, SDHA (sequence changes only), SDHB, SDHC, SDHD, SMAD4, SMARCA4, SMARCB1, SMARCE1, STK11, SUFU, TERC, TERT, TMEM127, TP53, TSC1, TSC2, VHL, WRN and WT1.    06/26/2020 Surgery   Right lumpectomy ([facility]): IDC, grade 1, 0.2cm, with intermediate grade DCIS, and 2 right axillary lymph nodes negative for carcinoma.   Malignant neoplasm of upper-inner quadrant of left breast in female, estrogen receptor positive (HCC)  04/02/2022 Initial Diagnosis   Suspicious 0.9 cm mass left breast 10 o'clock position, indeterminate 1.5 cm group of calcifications UIQ left breast: Mass and calcifications span 3.7 cm.  No axillary lymph node, biopsy: Grade 1 IDC with DCIS ER 100%, PR 60%, Ki-67 5%, HER2 2+ by IHC FISH negative ratio 1.34, left UIQ biopsy: Low-grade DCIS ER 95%, PR 80%   05/27/2022 Surgery   Left Lumpectomy: 0.7 cm Grade 1 ILC with LCIS, LG DCIS, Margins Negative, 0/5 LN Neg, ER 100%, PR: 60%, Her 2 : neg, Ki 67: 5%     REVIEW OF SYSTEMS:  Constitutional: Denies fevers, chills or abnormal weight loss Complains of chronic low back pain All other systems were reviewed  with the patient and are negative. Observations/Objective:     Assessment Plan:  Malignant neoplasm of upper-outer quadrant of right breast in female, estrogen receptor positive (HCC) 05/27/22: Left Lumpectomy: 0.7 cm Grade 1 ILC with LCIS, LG DCIS, Margins Negative, 0/5 LN Neg, ER 100%, PR: 60%, Her 2 : neg, Ki 67: 5%   Patient did not want to do radiation. Letrozole toxicities: (Patient could not tolerate anastrozole so we switched to letrozole, started having sharp pain in the back and she discontinued letrozole as well)   Kidney issues: Resolved  CT CAP 08/23/2022: No evidence of metastatic disease (benign-appearing liver cysts: We might get another CT in 1 year) Back pain: MRI lumbar spine 09/14/2022: No metastatic disease.  Degenerative changes L2-L3 and disc degeneration, L5-S1 facet arthropathy  Treatment plan: Sees Dr.Muzaffar (rheumatology) Will refer to Ortho  Return to clinic in 1 year for follow-up with me.  I discussed the assessment and treatment plan with the patient. The patient was provided an opportunity to ask questions and all were answered. The patient agreed with the plan and demonstrated an understanding of the instructions. The patient was advised to call back or seek an in-person evaluation if the symptoms worsen or if the condition fails to improve as anticipated.   I provided 12 minutes of non-face-to-face time during this encounter.  This includes time for charting and coordination of care   Tamsen Meek, MD

## 2022-09-13 ENCOUNTER — Ambulatory Visit
Admission: RE | Admit: 2022-09-13 | Discharge: 2022-09-13 | Disposition: A | Discharge status: Home / Self Care | Payer: 59 | Source: Ambulatory Visit | Attending: Hematology and Oncology | Admitting: Hematology and Oncology

## 2022-09-13 DIAGNOSIS — M545 Low back pain, unspecified: Secondary | ICD-10-CM

## 2022-09-13 DIAGNOSIS — Z170 Estrogen receptor positive status [ER+]: Secondary | ICD-10-CM

## 2022-09-13 MED ORDER — GADOPICLENOL 0.5 MMOL/ML IV SOLN
10.0000 mL | Freq: Once | INTRAVENOUS | Status: AC | PRN
  Administered 2022-09-13: 10 mL via INTRAVENOUS

## 2022-09-14 ENCOUNTER — Ambulatory Visit
Admission: RE | Admit: 2022-09-14 | Discharge: 2022-09-14 | Disposition: A | Discharge status: Home / Self Care | Payer: 59 | Source: Ambulatory Visit | Attending: Hematology and Oncology | Admitting: Hematology and Oncology

## 2022-09-14 DIAGNOSIS — Z170 Estrogen receptor positive status [ER+]: Secondary | ICD-10-CM

## 2022-09-14 DIAGNOSIS — M546 Pain in thoracic spine: Secondary | ICD-10-CM

## 2022-09-14 DIAGNOSIS — C50212 Malignant neoplasm of upper-inner quadrant of left female breast: Secondary | ICD-10-CM

## 2022-09-14 MED ORDER — GADOPICLENOL 0.5 MMOL/ML IV SOLN
10.0000 mL | Freq: Once | INTRAVENOUS | Status: AC | PRN
  Administered 2022-09-14: 10 mL via INTRAVENOUS

## 2022-09-17 ENCOUNTER — Inpatient Hospital Stay: Payer: 59 | Attending: Hematology and Oncology | Admitting: Hematology and Oncology

## 2022-09-17 DIAGNOSIS — Z170 Estrogen receptor positive status [ER+]: Secondary | ICD-10-CM | POA: Diagnosis not present

## 2022-09-17 DIAGNOSIS — G8929 Other chronic pain: Secondary | ICD-10-CM | POA: Diagnosis not present

## 2022-09-17 DIAGNOSIS — C50411 Malignant neoplasm of upper-outer quadrant of right female breast: Secondary | ICD-10-CM | POA: Diagnosis not present

## 2022-09-17 DIAGNOSIS — M545 Low back pain, unspecified: Secondary | ICD-10-CM | POA: Diagnosis not present

## 2022-09-17 NOTE — Assessment & Plan Note (Signed)
05/27/22: Left Lumpectomy: 0.7 cm Grade 1 ILC with LCIS, LG DCIS, Margins Negative, 0/5 LN Neg, ER 100%, PR: 60%, Her 2 : neg, Ki 67: 5%   Patient did not want to do radiation. Letrozole toxicities: (Patient could not tolerate anastrozole so we switched to letrozole, started having sharp pain in the back and she discontinued letrozole as well)   CT CAP 08/23/2022: No evidence of metastatic disease Back pain: MRI lumbar spine 09/14/2022: No metastatic disease.  Degenerative changes L2-L3 and disc degeneration, L5-S1 facet arthropathy  Treatment plan: Referral to orthopedics

## 2022-09-23 ENCOUNTER — Ambulatory Visit: Payer: Self-pay | Admitting: Radiation Oncology

## 2022-09-29 ENCOUNTER — Telehealth: Payer: Self-pay | Admitting: Family Medicine

## 2022-09-29 NOTE — Telephone Encounter (Signed)
Patient had x-rays and MRI's for back she is un able to see DrLaurian Brim nurse until October she is a lot of pain and asking for help please advise

## 2022-09-30 ENCOUNTER — Ambulatory Visit (INDEPENDENT_AMBULATORY_CARE_PROVIDER_SITE_OTHER): Payer: 59 | Admitting: Family Medicine

## 2022-09-30 ENCOUNTER — Encounter: Payer: Self-pay | Admitting: Family Medicine

## 2022-09-30 VITALS — BP 127/63 | HR 97 | Ht 63.0 in | Wt 246.0 lb

## 2022-09-30 DIAGNOSIS — G8929 Other chronic pain: Secondary | ICD-10-CM

## 2022-09-30 DIAGNOSIS — M545 Low back pain, unspecified: Secondary | ICD-10-CM | POA: Diagnosis not present

## 2022-09-30 MED ORDER — PREDNISONE 20 MG PO TABS: ORAL_TABLET | ORAL | 0 refills | Status: AC

## 2022-09-30 MED ORDER — HYDROCODONE-ACETAMINOPHEN 5-325 MG PO TABS: 1.0000 | ORAL_TABLET | Freq: Two times a day (BID) | ORAL | 0 refills | Status: DC

## 2022-09-30 NOTE — Telephone Encounter (Signed)
I do not think I have access to the x-rays or MRI I do not see them.  I looked under care everywhere.  Does she need a refill on her pain medication for the short-term?  What would be most helpful.

## 2022-09-30 NOTE — Telephone Encounter (Signed)
Patient scheduled with Dr. Linford Arnold today at 2:40pm= she will discuss injection discussed previously with DR. Metheney at this appointment and will bring in copies of x-ray and MRI  to visit.

## 2022-09-30 NOTE — Progress Notes (Signed)
Acute Office Visit  Subjective:     Patient ID: Margaret Beasley, female    DOB: 14-Dec-1955, 67 y.o.   MRN: [id number]  Chief Complaint  Patient presents with   Back Pain    HPI Patient is in today for right low back pain.  It is predominantly affecting the right low back but now she started to get pain radiating down both lateral hips into the outer thigh area.  It is so bad that her palms are sweating at times and that is made it harder to rest.  It is worse when she is walking.  She is almost at the point where she feels like she could use a cane.  She did have x-rays and MRI done with Dr. Laurian Brim but does not have a follow-up with them until October and it had a significant increase in pain in her back.  She has been mostly using Voltaren gel.  She did have an MRI of the lumbar spine with and without contrast and an MRI sacrum with and without contrast right at the end of July.  They did note progressive degenerative disc does not have degeneration in the lumbar spine since 2021 and some vacuum disc phenomenon at that level.  Brought in a copy of the MRI report and recent chest CT abdomen pelvis.  Is not currently controlled with conservative treatment.  ROS      Objective:    BP 127/63   Pulse 97   Ht 5\' 3"  (1.6 m)   Wt 246 lb (111.6 kg)   SpO2 100%   BMI 43.58 kg/m    Physical Exam Vitals and nursing note reviewed.  Constitutional:      Appearance: She is well-developed.  HENT:     Head: Normocephalic and atraumatic.  Cardiovascular:     Rate and Rhythm: Normal rate and regular rhythm.     Heart sounds: Normal heart sounds.  Pulmonary:     Effort: Pulmonary effort is normal.     Breath sounds: Normal breath sounds.  Musculoskeletal:     Comments: Tender over the right low back.   Skin:    General: Skin is warm and dry.  Neurological:     Mental Status: She is alert and oriented to person, place, and time.  Psychiatric:        Behavior: Behavior normal.      No results found for any visits on 09/30/22.      Assessment & Plan:   Problem List Items Addressed This Visit   None Visit Diagnoses     Chronic right-sided low back pain without sciatica    -  Primary   Relevant Medications   predniSONE (DELTASONE) 20 MG tablet   HYDROcodone-acetaminophen (NORCO/VICODIN) 5-325 MG tablet      Right low back pain-she has had problems on and off for years and in fact was gena be scheduled for injections right before her cancer diagnosis several years ago since then she is just managed it but more recently her pain is increased significantly.  Did discuss prednisone short-term and also sent prescription for hydrocodone to take twice a day for at least the short term.  Going to see if maybe they would be able to move up her appointment get in with Dr. Benjamin Stain.  Meds ordered this encounter  Medications   predniSONE (DELTASONE) 20 MG tablet    Sig: Take 2 tablets (40 mg total) by mouth daily with breakfast for 4 days, THEN 1  tablet (20 mg total) daily with breakfast for 4 days, THEN 0.5 tablets (10 mg total) daily with breakfast for 4 days.    Dispense:  10 tablet    Refill:  0   HYDROcodone-acetaminophen (NORCO/VICODIN) 5-325 MG tablet    Sig: Take 1 tablet by mouth 2 (two) times daily.    Dispense:  30 tablet    Refill:  0    No follow-ups on file.  Nani Gasser, MD

## 2022-10-01 ENCOUNTER — Telehealth: Payer: Self-pay | Admitting: Hematology and Oncology

## 2022-10-01 NOTE — Telephone Encounter (Signed)
Scheduled appointment per 8/2 los. Patient is aware of the made appointment.

## 2022-10-07 ENCOUNTER — Encounter: Payer: Self-pay | Admitting: Sports Medicine

## 2022-10-07 ENCOUNTER — Ambulatory Visit (INDEPENDENT_AMBULATORY_CARE_PROVIDER_SITE_OTHER): Payer: 59 | Admitting: Sports Medicine

## 2022-10-07 DIAGNOSIS — M5136 Other intervertebral disc degeneration, lumbar region: Secondary | ICD-10-CM | POA: Diagnosis not present

## 2022-10-07 DIAGNOSIS — M47816 Spondylosis without myelopathy or radiculopathy, lumbar region: Secondary | ICD-10-CM

## 2022-10-07 DIAGNOSIS — M51369 Other intervertebral disc degeneration, lumbar region without mention of lumbar back pain or lower extremity pain: Secondary | ICD-10-CM

## 2022-10-07 MED ORDER — METHYLPREDNISOLONE ACETATE 80 MG/ML IJ SUSP
80.0000 mg | Freq: Once | INTRAMUSCULAR | Status: AC
Start: 2022-10-07 — End: 2022-10-07
  Administered 2022-10-07: 80 mg via INTRAMUSCULAR

## 2022-10-07 NOTE — Assessment & Plan Note (Signed)
Pleasant 67 year old female with multiple allergies.  Chronic axial low back pain with occasional radiation down the right leg to the entire foot and all toes from the lateral aspect of the great toe to the fifth toe. Most of the axial pain is on the right side of the low back, she localizes somewhat medially to the SI joint. She did have an MRI done due to a history of of cancer, the results are dictated above. I explained her the anatomy, pathophysiology and evolutionary anthropology of lumbar disc disease and spondylosis. She understands the importance of aggressive physical therapy. She declined prednisone and Decadron orally so we did do Depo-Medrol 80 intramuscular, she understands the for 7-day onset. She will do physical therapy for 6 weeks, at the follow-up if insufficient improvement we will consider an epidural versus facet injection based on which ever pain generator declares itself as the primary.

## 2022-10-07 NOTE — Addendum Note (Signed)
Addended by: Carren Rang A on: 10/07/2022 01:21 PM   Modules accepted: Orders

## 2022-10-07 NOTE — Progress Notes (Signed)
    Procedures performed today:    None.  Independent interpretation of notes and tests performed by another provider:   Lumbar spine MRI personally reviewed, multilevel lumbar DDD, worst L2-L3 with adjacent endplate edema, she also has L5-S1 broad-based disc protrusion, multilevel lumbar facet arthritis.  Brief History, Exam, Impression, and Recommendations:    Lumbar degenerative disc disease Pleasant 67 year old female with multiple allergies.  Chronic axial low back pain with occasional radiation down the right leg to the entire foot and all toes from the lateral aspect of the great toe to the fifth toe. Most of the axial pain is on the right side of the low back, she localizes somewhat medially to the SI joint. She did have an MRI done due to a history of of cancer, the results are dictated above. I explained her the anatomy, pathophysiology and evolutionary anthropology of lumbar disc disease and spondylosis. She understands the importance of aggressive physical therapy. She declined prednisone and Decadron orally so we did do Depo-Medrol 80 intramuscular, she understands the for 7-day onset. She will do physical therapy for 6 weeks, at the follow-up if insufficient improvement we will consider an epidural versus facet injection based on which ever pain generator declares itself as the primary.    ____________________________________________ Ihor Austin. Benjamin Stain, M.D., ABFM., CAQSM., AME. Primary Care and Sports Medicine [facility]  Adjunct Professor of Family Medicine  [employer] [facility] [employer] [facility] [employer]  Restaurant manager, fast food

## 2022-10-11 ENCOUNTER — Other Ambulatory Visit: Payer: Self-pay | Admitting: Family Medicine
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
## 2022-10-12 ENCOUNTER — Other Ambulatory Visit: Payer: Self-pay

## 2022-10-12 ENCOUNTER — Ambulatory Visit: Payer: 59 | Attending: Sports Medicine

## 2022-10-12 DIAGNOSIS — M6281 Muscle weakness (generalized): Secondary | ICD-10-CM | POA: Diagnosis present

## 2022-10-12 DIAGNOSIS — M5459 Other low back pain: Secondary | ICD-10-CM | POA: Diagnosis present

## 2022-10-12 DIAGNOSIS — M47816 Spondylosis without myelopathy or radiculopathy, lumbar region: Secondary | ICD-10-CM | POA: Insufficient documentation

## 2022-10-12 DIAGNOSIS — R262 Difficulty in walking, not elsewhere classified: Secondary | ICD-10-CM | POA: Insufficient documentation

## 2022-10-21 ENCOUNTER — Ambulatory Visit: Payer: 59 | Admitting: Gastroenterology

## 2022-10-26 ENCOUNTER — Ambulatory Visit: Payer: 59

## 2022-11-02 ENCOUNTER — Ambulatory Visit: Payer: 59 | Attending: Sports Medicine

## 2022-11-02 DIAGNOSIS — M5459 Other low back pain: Secondary | ICD-10-CM | POA: Insufficient documentation

## 2022-11-02 DIAGNOSIS — M6281 Muscle weakness (generalized): Secondary | ICD-10-CM | POA: Insufficient documentation

## 2022-11-02 DIAGNOSIS — R262 Difficulty in walking, not elsewhere classified: Secondary | ICD-10-CM | POA: Insufficient documentation
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
## 2022-11-03 ENCOUNTER — Encounter: Payer: Self-pay | Admitting: Family Medicine

## 2022-11-03 ENCOUNTER — Ambulatory Visit (INDEPENDENT_AMBULATORY_CARE_PROVIDER_SITE_OTHER): Payer: 59 | Admitting: Family Medicine

## 2022-11-03 VITALS — BP 136/78 | HR 80

## 2022-11-03 DIAGNOSIS — K130 Diseases of lips: Secondary | ICD-10-CM | POA: Diagnosis not present

## 2022-11-03 DIAGNOSIS — K146 Glossodynia: Secondary | ICD-10-CM

## 2022-11-03 DIAGNOSIS — E559 Vitamin D deficiency, unspecified: Secondary | ICD-10-CM

## 2022-11-03 DIAGNOSIS — E538 Deficiency of other specified B group vitamins: Secondary | ICD-10-CM

## 2022-11-03 DIAGNOSIS — R519 Headache, unspecified: Secondary | ICD-10-CM

## 2022-11-03 DIAGNOSIS — R32 Unspecified urinary incontinence: Secondary | ICD-10-CM

## 2022-11-03 LAB — POCT URINALYSIS DIP (CLINITEK)
Bilirubin, UA: NEGATIVE
Blood, UA: NEGATIVE
Glucose, UA: NEGATIVE mg/dL
Ketones, POC UA: NEGATIVE mg/dL
Leukocytes, UA: NEGATIVE
Nitrite, UA: NEGATIVE
POC PROTEIN,UA: NEGATIVE
Spec Grav, UA: 1.01 (ref 1.010–1.025)
Urobilinogen, UA: 0.2 U/dL
pH, UA: 7 (ref 5.0–8.0)

## 2022-11-03 MED ORDER — PREDNISONE 20 MG PO TABS: 40.0000 mg | ORAL_TABLET | Freq: Every day | ORAL | 0 refills | Status: DC

## 2022-11-03 NOTE — Addendum Note (Signed)
Addended by: Deno Etienne on: 11/03/2022 03:51 PM   Modules accepted: Orders

## 2022-11-03 NOTE — Progress Notes (Signed)
Established Patient Office Visit  Subjective   Patient ID: Margaret Beasley, female    DOB: 1955-08-05  Age: 67 y.o. MRN: [id number]  No chief complaint on file.   HPI  For 9 days she she says she has been really thirsty drinking tons of water she has had burning on the tip of her tongue and her lips.  She has had really bad headache on this migraine level.  She has had 2 episodes of just straight urinary incontinence without any dysuria or hematuria.  No fevers chills or sweats.  She said she was not sick before this started no recent respiratory illnesses.  She is also had some stool incontinence issues.  She says she has felt really achy from head to toe and it has been very difficult to focus.  Her last methotrexate dose was last week she has not taken it this week.  He did have some cortisol injections into her ankle.    ROS    Objective:     BP 136/78   Pulse 80   SpO2 98%    Physical Exam Constitutional:      Appearance: Normal appearance.  HENT:     Head: Normocephalic and atraumatic.     Right Ear: Tympanic membrane, ear canal and external ear normal.     Left Ear: Tympanic membrane, ear canal and external ear normal.     Nose: Nose normal.     Mouth/Throat:     Pharynx: Oropharynx is clear.  Eyes:     Extraocular Movements: Extraocular movements intact.     Conjunctiva/sclera: Conjunctivae normal.     Pupils: Pupils are equal, round, and reactive to light.  Neck:     Thyroid: No thyromegaly.  Cardiovascular:     Rate and Rhythm: Normal rate and regular rhythm.  Pulmonary:     Effort: Pulmonary effort is normal.     Breath sounds: Normal breath sounds.  Abdominal:     General: Bowel sounds are normal.     Palpations: Abdomen is soft.     Tenderness: There is no abdominal tenderness.  Musculoskeletal:        General: No swelling.     Cervical back: Neck supple.  Skin:    General: Skin is warm and dry.  Neurological:     Mental Status: She is  oriented to person, place, and time.  Psychiatric:        Mood and Affect: Mood normal.        Behavior: Behavior normal.      Results for orders placed or performed in visit on 11/03/22  POCT URINALYSIS DIP (CLINITEK)  Result Value Ref Range   Color, UA yellow yellow   Clarity, UA clear clear   Glucose, UA negative negative mg/dL   Bilirubin, UA negative negative   Ketones, POC UA negative negative mg/dL   Spec Grav, UA 0.865 7.846 - 1.025   Blood, UA negative negative   pH, UA 7.0 5.0 - 8.0   POC PROTEIN,UA negative negative, trace   Urobilinogen, UA 0.2 0.2 or 1.0 E.U./dL   Nitrite, UA Negative Negative   Leukocytes, UA Negative Negative      The 10-year ASCVD risk score (Arnett DK, et al., 2019) is: 9.5%    Assessment & Plan:   Problem List Items Addressed This Visit   None Visit Diagnoses     Tongue burning sensation    -  Primary   Relevant Orders   CBC with  Differential/Platelet   CMP14+EGFR   TSH   Vitamin B1   Vitamin B6   B12   Sedimentation rate   VITAMIN D 25 Hydroxy (Vit-D Deficiency, Fractures)   Urinary incontinence, unspecified type       Relevant Orders   CBC with Differential/Platelet   CMP14+EGFR   TSH   Vitamin B1   Vitamin B6   B12   Sedimentation rate   VITAMIN D 25 Hydroxy (Vit-D Deficiency, Fractures)   POCT URINALYSIS DIP (CLINITEK) (Completed)   Nonintractable headache, unspecified chronicity pattern, unspecified headache type       Relevant Orders   CBC with Differential/Platelet   CMP14+EGFR   TSH   Vitamin B1   Vitamin B6   B12   Sedimentation rate   VITAMIN D 25 Hydroxy (Vit-D Deficiency, Fractures)   Lip pain       Relevant Orders   CBC with Differential/Platelet   CMP14+EGFR   TSH   Vitamin B1   Vitamin B6   B12   Sedimentation rate   VITAMIN D 25 Hydroxy (Vit-D Deficiency, Fractures)   Low serum vitamin B12       Relevant Orders   CBC with Differential/Platelet   CMP14+EGFR   TSH   Vitamin B1   Vitamin  B6   B12   Sedimentation rate   VITAMIN D 25 Hydroxy (Vit-D Deficiency, Fractures)   Vitamin D deficiency       Relevant Orders   CBC with Differential/Platelet   CMP14+EGFR   TSH   Vitamin B1   Vitamin B6   B12   Sedimentation rate   VITAMIN D 25 Hydroxy (Vit-D Deficiency, Fractures)      Tongue and mouth burning-will check for deficiencies that does seem to be getting gradually a little better it sounds like she also had a few ulcerations and those seem to be healing as well unclear etiology about potential trigger.  Vitamin D deficiency will check that again today to see if her levels are improving.  Blurry vision-unclear etiology but will check glucose level.  With increased thirst and urination we will see if we can add an A1c onto labs tomorrow.  Urinary incontinence-unclear etiology.  Urinalysis looks clear so no sign of infection.  She wonders if some of the symptoms could be related to her Sjogren's especially the dry mouth and increased thirst.  But some of the other symptoms are little unusual.  Will check a sed rate to see if there is high levels of inflammation currently.  Consider further w/u with brain imaging if labs come back normal tomorrow.     No follow-ups on file.    Nani Gasser, MD

## 2022-11-04 NOTE — Progress Notes (Signed)
HI Margaret Beasley,  Vitamin D is still low.  Please let me know how much you are taking.  We might need to change to this.  Your blood count is stable.  Metabolic panel looks good.  Thyroid looks perfect at 1.9.  Vitamin B1 and B6 are still pending.  B12 looks great.  Inflammatory markers normal.

## 2022-11-05 ENCOUNTER — Other Ambulatory Visit: Payer: Self-pay | Admitting: Family Medicine

## 2022-11-05 ENCOUNTER — Telehealth: Payer: Self-pay | Admitting: Family Medicine

## 2022-11-05 DIAGNOSIS — K146 Glossodynia: Secondary | ICD-10-CM

## 2022-11-05 DIAGNOSIS — R32 Unspecified urinary incontinence: Secondary | ICD-10-CM

## 2022-11-05 DIAGNOSIS — R519 Headache, unspecified: Secondary | ICD-10-CM

## 2022-11-05 LAB — URINE CULTURE

## 2022-11-05 LAB — SPECIMEN STATUS REPORT

## 2022-11-05 NOTE — Progress Notes (Signed)
Orders Placed This Encounter  Procedures   MR Brain W Wo Contrast    Tongue numbness or tingling and new onset incontinence. Hx of BrCa    Standing Status:   Future    Standing Expiration Date:   11/05/2023    Order Specific Question:   If indicated for the ordered procedure, I authorize the administration of contrast media per Radiology protocol    Answer:   Yes    Order Specific Question:   What is the patient's sedation requirement?    Answer:   No Sedation    Order Specific Question:   Does the patient have a pacemaker or implanted devices?    Answer:   No    Order Specific Question:   Preferred imaging location?    Answer:   [facility] (table limit-350lbs)

## 2022-11-05 NOTE — Telephone Encounter (Signed)
Pcp to review.

## 2022-11-05 NOTE — Telephone Encounter (Signed)
Patient was unable to get into my chart to answer questions  She called to respond She forgot to take vitamin D currently not on any Yes my mouth and tongue are still burning badly One minor Incontinence mishap   If you have any questions please contact [phone]

## 2022-11-05 NOTE — Progress Notes (Signed)
Margaret Beasley, urine culture was negative.  You still having the tongue and mouth burning and the incontinence?  If you are please let me know.

## 2022-11-06 LAB — SPECIMEN STATUS REPORT

## 2022-11-06 LAB — HGB A1C W/O EAG: Hgb A1c MFr Bld: 5.9 % — ABNORMAL HIGH (ref 4.8–5.6)

## 2022-11-08 ENCOUNTER — Telehealth: Payer: Self-pay | Admitting: Family Medicine

## 2022-11-08 ENCOUNTER — Telehealth: Payer: Self-pay

## 2022-11-08 LAB — VITAMIN B1: Thiamine: 111.3 nmol/L (ref 66.5–200.0)

## 2022-11-08 LAB — VITAMIN B6: Vitamin B6: 7.5 ug/L (ref 3.4–65.2)

## 2022-11-08 LAB — CBC WITH DIFFERENTIAL/PLATELET
Basophils Absolute: 0 10*3/uL (ref 0.0–0.2)
Basos: 1 %
EOS (ABSOLUTE): 0.1 10*3/uL (ref 0.0–0.4)
Eos: 3 %
Hematocrit: 45.2 % (ref 34.0–46.6)
Hemoglobin: 14.6 g/dL (ref 11.1–15.9)
Immature Grans (Abs): 0 10*3/uL (ref 0.0–0.1)
Immature Granulocytes: 1 %
Lymphocytes Absolute: 1 10*3/uL (ref 0.7–3.1)
Lymphs: 23 %
MCH: 32.4 pg (ref 26.6–33.0)
MCHC: 32.3 g/dL (ref 31.5–35.7)
MCV: 100 fL — ABNORMAL HIGH (ref 79–97)
Monocytes Absolute: 0.4 10*3/uL (ref 0.1–0.9)
Monocytes: 11 %
Neutrophils Absolute: 2.6 10*3/uL (ref 1.4–7.0)
Neutrophils: 61 %
Platelets: 237 10*3/uL (ref 150–450)
RBC: 4.51 x10E6/uL (ref 3.77–5.28)
RDW: 14.1 % (ref 11.7–15.4)
WBC: 4.2 10*3/uL (ref 3.4–10.8)

## 2022-11-08 LAB — CMP14+EGFR
ALT: 17 IU/L (ref 0–32)
AST: 15 IU/L (ref 0–40)
Albumin: 4.3 g/dL (ref 3.9–4.9)
Alkaline Phosphatase: 76 IU/L (ref 44–121)
BUN/Creatinine Ratio: 10 — ABNORMAL LOW (ref 12–28)
BUN: 9 mg/dL (ref 8–27)
Bilirubin Total: 0.5 mg/dL (ref 0.0–1.2)
CO2: 28 mmol/L (ref 20–29)
Calcium: 9.3 mg/dL (ref 8.7–10.3)
Chloride: 100 mmol/L (ref 96–106)
Creatinine, Ser: 0.94 mg/dL (ref 0.57–1.00)
Globulin, Total: 2.5 g/dL (ref 1.5–4.5)
Glucose: 96 mg/dL (ref 70–99)
Potassium: 5.1 mmol/L (ref 3.5–5.2)
Sodium: 139 mmol/L (ref 134–144)
Total Protein: 6.8 g/dL (ref 6.0–8.5)
eGFR: 67 mL/min/{1.73_m2} (ref >59)

## 2022-11-08 LAB — VITAMIN B12: Vitamin B-12: 594 pg/mL (ref 232–1245)

## 2022-11-08 LAB — TSH: TSH: 1.97 u[IU]/mL (ref 0.450–4.500)

## 2022-11-08 LAB — SEDIMENTATION RATE: Sed Rate: 15 mm/h (ref 0–40)

## 2022-11-08 LAB — VITAMIN D 25 HYDROXY (VIT D DEFICIENCY, FRACTURES): Vit D, 25-Hydroxy: 26.3 ng/mL — ABNORMAL LOW (ref 30.0–100.0)

## 2022-11-08 NOTE — Telephone Encounter (Signed)
Pt called. Pt states that a MRI brain scan has been but she does not know anything about this.  Please advise.

## 2022-11-08 NOTE — Telephone Encounter (Signed)
Pt called and advised that Dr. Linford Arnold wanted to get Brain MRI done Since she was still having some sxs.    Pt voiced understanding. And would await call for scheduling.

## 2022-11-08 NOTE — Telephone Encounter (Signed)
Spoke with patient to confirm PT appointment tomorrow. Patient would like to hold PT until she undergoes her brain MRI.   Letitia Libra, PT, DPT, ATC 11/08/22 2:42 PM

## 2022-11-08 NOTE — Progress Notes (Signed)
Okay, moving forward with ordering brain MRI we will get that scheduled and set up.  B1 looks great.  It looks like the B6 is still pending.

## 2022-11-09 ENCOUNTER — Ambulatory Visit: Payer: 59

## 2022-11-09 NOTE — Progress Notes (Signed)
And B6 looks normal.

## 2022-11-10 NOTE — Progress Notes (Signed)
I do not know.  This will need to be checked to see if it is in process of authorization or if authorization has already been obtained.

## 2022-11-15 ENCOUNTER — Telehealth: Payer: Self-pay | Admitting: Family Medicine

## 2022-11-15 NOTE — Telephone Encounter (Signed)
Patient came in the office today wanting an update on her MRI for brain, states that she needs a PA for this before having it done. Please advise.

## 2022-11-16 ENCOUNTER — Ambulatory Visit: Payer: 59

## 2022-11-18 ENCOUNTER — Ambulatory Visit (INDEPENDENT_AMBULATORY_CARE_PROVIDER_SITE_OTHER): Payer: 59 | Admitting: Family Medicine

## 2022-11-18 ENCOUNTER — Encounter: Payer: Self-pay | Admitting: Family Medicine

## 2022-11-18 ENCOUNTER — Ambulatory Visit: Payer: 59 | Admitting: Sports Medicine

## 2022-11-18 VITALS — BP 129/79 | HR 71 | Ht 63.0 in | Wt 249.0 lb

## 2022-11-18 DIAGNOSIS — R202 Paresthesia of skin: Secondary | ICD-10-CM

## 2022-11-18 DIAGNOSIS — I10 Essential (primary) hypertension: Secondary | ICD-10-CM

## 2022-11-18 DIAGNOSIS — N1832 Chronic kidney disease, stage 3b: Secondary | ICD-10-CM | POA: Diagnosis not present

## 2022-11-18 DIAGNOSIS — T783XXA Angioneurotic edema, initial encounter: Secondary | ICD-10-CM

## 2022-11-18 DIAGNOSIS — G40309 Generalized idiopathic epilepsy and epileptic syndromes, not intractable, without status epilepticus: Secondary | ICD-10-CM

## 2022-11-18 DIAGNOSIS — G40909 Epilepsy, unspecified, not intractable, without status epilepticus: Secondary | ICD-10-CM

## 2022-11-18 NOTE — Assessment & Plan Note (Signed)
Lab Results  Component Value Date   CREATININE 0.94 11/03/2022   Recent kidney function is stable.

## 2022-11-18 NOTE — Assessment & Plan Note (Addendum)
Consider toxicity of antiepilieptic so we will go ahead and check a Lamictal and Keppra level today just to make sure that it is not significantly elevated.

## 2022-11-18 NOTE — Progress Notes (Signed)
Established Patient Office Visit  Subjective   Patient ID: Margaret Beasley, female    DOB: 1955-06-29  Age: 67 y.o. MRN: [id number]  Chief Complaint  Patient presents with   Facial Swelling    Lip/ and facial swelling on the lips , and skin tightness  around  mouth     HPI She is here today because she still getting the numbness and tingling around her face and tongue.  Now she actually feels like it swelling she says it is so swollen that the skin looks shiny and she is having to use tons of chapped stick to keep it moisturized so it does not crack.  She feels like it swollen up to her cheekbones and down to her chin and even a little bit around her eyes.  She feels like she has had a little bit of blurry vision as well.  She has had some mild intermittent sore throat but no numbness in the throat.  She denies any recent injury or tick bites.  She does have a brain MRI scheduled for Monday.  Low Vitamin D -did go ahead and start 2000 IU twice a day of vitamin D about 2 weeks ago.  Her vitamin D level was low at 26.  Vitamins B1, B6 and B12 look great.  Thyroid level was normal.  Did have an MRI of her head May 2023: Just showed some small foci of T2 hyperintensity in the cerebral white matter and some patchy T2 hyperintensity in the pons.    ROS    Objective:     BP 129/79   Pulse 71   Ht 5\' 3"  (1.6 m)   Wt 249 lb (112.9 kg)   SpO2 99%   BMI 44.11 kg/m    Physical Exam Vitals and nursing note reviewed.  Constitutional:      Appearance: Normal appearance.  HENT:     Head: Normocephalic and atraumatic.  Eyes:     Conjunctiva/sclera: Conjunctivae normal.  Cardiovascular:     Rate and Rhythm: Normal rate and regular rhythm.  Pulmonary:     Effort: Pulmonary effort is normal.     Breath sounds: Normal breath sounds.  Skin:    General: Skin is warm and dry.     Comments: Lips look mildly swollen and shiny. Some swelling under both eyes./   Neurological:     Mental  Status: She is alert.  Psychiatric:        Mood and Affect: Mood normal.      No results found for any visits on 11/18/22.    The 10-year ASCVD risk score (Arnett DK, et al., 2019) is: 8.6%    Assessment & Plan:   Problem List Items Addressed This Visit       Cardiovascular and Mediastinum   Essential hypertension    BP looks great.  Will hold Losartan in case it is causing Angioedema.          Nervous and Auditory   Seizure disorder (HCC)   Relevant Orders   Lamotrigine level   C1 Esterase Inhibitor   Levetiracetam level   Generalized convulsive epilepsy (HCC)    Consider toxicity of antiepilieptic so we will go ahead and check a Lamictal and Keppra level today just to make sure that it is not significantly elevated.        Genitourinary   Chronic kidney disease (CKD) stage G3b/A1, moderately decreased glomerular filtration rate (GFR) between 30-44 mL/min/1.73 square meter and albuminuria creatinine  ratio less than 30 mg/g [facility]) - Primary    Lab Results  Component Value Date   CREATININE 0.94 11/03/2022   Recent kidney function is stable.        Relevant Orders   Lamotrigine level   C1 Esterase Inhibitor   Levetiracetam level     Other   Severe obesity (BMI >= 40) (HCC)    BMI 44.  She has unfortunately restricted with her activities.  Her weight is up a couple pounds since I last saw her a couple of weeks ago so I do wonder if she could also have a little bit more generalized edema and not just in her face.  Will monitor that carefully.  Recent thyroid level looks great.      Relevant Orders   Lamotrigine level   C1 Esterase Inhibitor   Levetiracetam level   Other Visit Diagnoses     Angioedema, initial encounter       Relevant Orders   Lamotrigine level   C1 Esterase Inhibitor   Levetiracetam level   Tingling of face       Relevant Orders   Lamotrigine level   C1 Esterase Inhibitor   Levetiracetam level      At this point I am concerned  about angioedema so work on a hold her blood pressure medication..  We discussed that this can cause some angioedema.  She does have her MRI scheduled for Monday.  I am hoping that the swelling and tingling will go down over the weekend and start improving show give [place] a call Monday and give [place] an update.  I am also going to check Lamictal and Keppra levels as well just to make sure that she is not experiencing any toxicity.  No follow-ups on file.    Nani Gasser, MD

## 2022-11-18 NOTE — Patient Instructions (Addendum)
Please hold your Losartan   Call us for update on Monday.

## 2022-11-18 NOTE — Assessment & Plan Note (Signed)
BMI 44.  She has unfortunately restricted with her activities.  Her weight is up a couple pounds since I last saw her a couple of weeks ago so I do wonder if she could also have a little bit more generalized edema and not just in her face.  Will monitor that carefully.  Recent thyroid level looks great.

## 2022-11-18 NOTE — Assessment & Plan Note (Signed)
BP looks great.  Will hold Losartan in case it is causing Angioedema.

## 2022-11-19 LAB — LAMOTRIGINE LEVEL: Lamotrigine Lvl: 4.4 ug/mL (ref 2.0–20.0)

## 2022-11-19 LAB — LEVETIRACETAM LEVEL: Levetiracetam Lvl: 42.6 ug/mL — ABNORMAL HIGH (ref 10.0–40.0)

## 2022-11-19 LAB — C1 ESTERASE INHIBITOR: C1INH SerPl-mCnc: 39 mg/dL (ref 21–39)

## 2022-11-22 ENCOUNTER — Other Ambulatory Visit: Payer: 59

## 2022-11-23 NOTE — Progress Notes (Signed)
HI Margaret Beasley, your Keppra level is just a little bit outside of the optimal range it is a little elevated.  Do you feel comfortable reaching out to your neurologist about this, and see if they would want to make a minor adjustment on your medication?  Or we can send them a note as well.  Do you feel like holding the blood pressure pill has made a difference?

## 2022-11-25 NOTE — Progress Notes (Signed)
OK, lets give neuro a call aobut the drug level Ok to restart losartan since not helping when not taking it.   When is her Brain MRI scheduled? Are we still waiting on PA. Ordered 9/20

## 2023-01-26 ENCOUNTER — Other Ambulatory Visit: Payer: Self-pay | Admitting: Family Medicine
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
## 2023-02-03 ENCOUNTER — Ambulatory Visit: Payer: 59

## 2023-02-14 ENCOUNTER — Other Ambulatory Visit: Payer: 59

## 2023-02-14 ENCOUNTER — Telehealth: Payer: Self-pay

## 2023-02-14 NOTE — Telephone Encounter (Signed)
Called Margaret Beasley back about her message she left. She has lymphedema in her left breast, had surgery in April 2024, and has a rare lobular cancer. She has started to develop some symptoms that she experienced with her cancer diagnosis initially and this is concerning to her. She is sleeping more, having problems with nausea, dizziness, and appetite issues. [facility] to see if she could get on the schedule for a diagnostic mammo but they stated they needed an order.  Consulted with Lillard Anes, NP and she recommended that the patient should be seen tomorrow.  Called Mrs. Cuadras to let her know this information and she will get labs tomorrow at 1215 and then see Mardella Layman at 1245. Lorayne Marek, RN

## 2023-02-15 ENCOUNTER — Inpatient Hospital Stay: Payer: 59 | Admitting: Adult Health

## 2023-02-15 ENCOUNTER — Telehealth: Payer: Self-pay | Admitting: Physician Assistant

## 2023-02-15 ENCOUNTER — Inpatient Hospital Stay: Payer: 59
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
## 2023-02-22 ENCOUNTER — Ambulatory Visit: Payer: 59 | Attending: Surgery

## 2023-02-22 ENCOUNTER — Other Ambulatory Visit: Payer: Self-pay

## 2023-02-22 DIAGNOSIS — Z170 Estrogen receptor positive status [ER+]: Secondary | ICD-10-CM | POA: Insufficient documentation

## 2023-02-22 DIAGNOSIS — I890 Lymphedema, not elsewhere classified: Secondary | ICD-10-CM | POA: Diagnosis present

## 2023-02-22 DIAGNOSIS — M25512 Pain in left shoulder: Secondary | ICD-10-CM | POA: Insufficient documentation

## 2023-02-22 DIAGNOSIS — M25511 Pain in right shoulder: Secondary | ICD-10-CM | POA: Diagnosis present

## 2023-02-22 DIAGNOSIS — Z853 Personal history of malignant neoplasm of breast: Secondary | ICD-10-CM | POA: Insufficient documentation

## 2023-02-22 DIAGNOSIS — C50411 Malignant neoplasm of upper-outer quadrant of right female breast: Secondary | ICD-10-CM | POA: Insufficient documentation

## 2023-02-28 ENCOUNTER — Encounter: Payer: Self-pay | Admitting: Surgery

## 2023-02-28 DIAGNOSIS — Z9889 Other specified postprocedural states: Secondary | ICD-10-CM

## 2023-03-01 ENCOUNTER — Other Ambulatory Visit: Payer: Self-pay | Admitting: Surgery

## 2023-03-01 DIAGNOSIS — Z853 Personal history of malignant neoplasm of breast: Secondary | ICD-10-CM

## 2023-03-02 ENCOUNTER — Telehealth: Payer: Self-pay | Admitting: Genetic Counselor

## 2023-03-02 ENCOUNTER — Encounter: Payer: Self-pay | Admitting: Genetic Counselor

## 2023-03-02 NOTE — Telephone Encounter (Signed)
 Patient Margaret Beasley but was cut off before completing message.  Returned call and Margaret Beasley with CB instructions.

## 2023-03-02 NOTE — Telephone Encounter (Signed)
 Patient called to inform us  that mother was recently diagnosed with leukemia (CLL).  Told patient that she had been tested previously for some hematologic genes but not all.  However, most known hereditary hematological cancers are associated with acute leukemias as opposed to chronic.  Encouraged patient to have physicals at least annually and complete routine blood work as recommended by providers.

## 2023-03-03 ENCOUNTER — Ambulatory Visit (INDEPENDENT_AMBULATORY_CARE_PROVIDER_SITE_OTHER): Payer: 59 | Admitting: Family Medicine

## 2023-03-03 ENCOUNTER — Encounter: Payer: Self-pay | Admitting: Family Medicine

## 2023-03-03 VITALS — BP 129/77 | HR 84 | Ht 63.0 in | Wt 248.0 lb

## 2023-03-03 DIAGNOSIS — I890 Lymphedema, not elsewhere classified: Secondary | ICD-10-CM | POA: Insufficient documentation

## 2023-03-03 DIAGNOSIS — N182 Chronic kidney disease, stage 2 (mild): Secondary | ICD-10-CM

## 2023-03-03 DIAGNOSIS — N644 Mastodynia: Secondary | ICD-10-CM

## 2023-03-03 DIAGNOSIS — I10 Essential (primary) hypertension: Secondary | ICD-10-CM

## 2023-03-03 DIAGNOSIS — N1832 Chronic kidney disease, stage 3b: Secondary | ICD-10-CM

## 2023-03-03 DIAGNOSIS — G40909 Epilepsy, unspecified, not intractable, without status epilepticus: Secondary | ICD-10-CM

## 2023-03-03 MED ORDER — LOSARTAN POTASSIUM 50 MG PO TABS
50.0000 mg | ORAL_TABLET | Freq: Every day | ORAL | 1 refills | Status: DC
Start: 2023-03-03 — End: 2023-09-26

## 2023-03-03 NOTE — Assessment & Plan Note (Signed)
She was able to restart the medication and is doing well overall. BP at goal

## 2023-03-03 NOTE — Assessment & Plan Note (Signed)
 Currently on Keppra

## 2023-03-03 NOTE — Assessment & Plan Note (Signed)
Check renal function and urinalysis today.  Will keep a close eye on this.

## 2023-03-03 NOTE — Progress Notes (Addendum)
Established Patient Office Visit  Subjective  Patient ID: Margaret Beasley, female    DOB: 1955/10/19  Age: 68 y.o. MRN: [id number]  Chief Complaint  Patient presents with   Hypertension    HPI  Hypertension- Pt denies chest pain, SOB, dizziness, or heart palpitations.  Taking meds as directed w/o problems.  Denies medication side effects.   When I last saw her in October we had decided to hold her blood pressure pill because of potential concern about angioedema.the swelling got worse and she saw her husband's derm.  Started a cream and it got much better on its own.  She was able to restart the BP pill.   He did want to let me know that she saw the orthopedist for her shoulder pain they did an injection and she says it has helped some so far she has an appointment coming up in another week to follow-up.  Also wanted to let me know that she has been getting some intermittent pain in her left breast that starts at the base or fold of the breast and then is shooting upward.  It is usually brief and just lasts a second or 2 but has been coming and going 1 day she said it happened 22 times in a row she actually counted.  She has not noticed any changes in the breast or breast tissue no discharge from the nipple etc.  Her mammogram is already scheduled for February 7 so in about 3 weeks.  Like to have her kidneys rechecked today.   She is having issues with lymphedema in her chest wall again.  She tried to go in and do some treatment therapy but she was having such issues with her shoulders and was having a lot of pain in her arms that they were unable to do the treatment.  The area just above her left clavicle feels puffy and swollen again.   ROS    Objective:     BP 129/77   Pulse 84   Ht 5\' 3"  (1.6 m)   Wt 248 lb (112.5 kg)   SpO2 96%   BMI 43.93 kg/m    Physical Exam Vitals and nursing note reviewed.  Constitutional:      Appearance: Normal appearance.  HENT:     Head:  Normocephalic and atraumatic.  Eyes:     Conjunctiva/sclera: Conjunctivae normal.  Cardiovascular:     Rate and Rhythm: Normal rate and regular rhythm.  Pulmonary:     Effort: Pulmonary effort is normal.     Breath sounds: Normal breath sounds.  Skin:    General: Skin is warm and dry.  Neurological:     Mental Status: She is alert.  Psychiatric:        Mood and Affect: Mood normal.        Results for orders placed or performed in visit on 03/03/23  Basic Metabolic Panel (BMET)  Result Value Ref Range   Glucose 97 70 - 99 mg/dL   BUN 13 8 - 27 mg/dL   Creatinine, Ser 7.82 (H) 0.57 - 1.00 mg/dL   eGFR 59 (L) >95 AO/ZHY/8.65   BUN/Creatinine Ratio 13 12 - 28   Sodium 140 134 - 144 mmol/L   Potassium 4.6 3.5 - 5.2 mmol/L   Chloride 99 96 - 106 mmol/L   CO2 24 20 - 29 mmol/L   Calcium 9.7 8.7 - 10.3 mg/dL  Urinalysis, Routine w reflex microscopic  Result Value Ref Range  Specific Gravity, UA 1.009 1.005 - 1.030   pH, UA 6.5 5.0 - 7.5   Color, UA Yellow Yellow   Appearance Ur Clear Clear   Leukocytes,UA Negative Negative   Protein,UA Negative Negative/Trace   Glucose, UA Negative Negative   Ketones, UA Negative Negative   RBC, UA Negative Negative   Bilirubin, UA Negative Negative   Urobilinogen, Ur 0.2 0.2 - 1.0 mg/dL   Nitrite, UA Negative Negative   Microscopic Examination Comment       The 10-year ASCVD risk score (Arnett DK, et al., 2019) is: 9.5%    Assessment & Plan:   Problem List Items Addressed This Visit       Cardiovascular and Mediastinum   Essential hypertension - Primary   She was able to restart the medication and is doing well overall. BP at goal       Relevant Medications   losartan (COZAAR) 50 MG tablet   Other Relevant Orders   Basic Metabolic Panel (BMET) (Completed)   Urinalysis, Routine w reflex microscopic (Completed)     Nervous and Auditory   Seizure disorder (HCC)   Currently on Keppra.        Genitourinary   CKD  (chronic kidney disease) stage 2, GFR 60-89 ml/min   Will monitor.       RESOLVED: Chronic kidney disease (CKD) stage G3b/A1, moderately decreased glomerular filtration rate (GFR) between 30-44 mL/min/1.73 square meter and albuminuria creatinine ratio less than 30 mg/g (HCC)   Check renal function and urinalysis today.  Will keep a close eye on this.      Relevant Orders   Basic Metabolic Panel (BMET) (Completed)   Urinalysis, Routine w reflex microscopic (Completed)     Other   Lymphedema   Other Visit Diagnoses       Breast pain, left          Breast pain-she is gena keep an eye on this.  She does have an upcoming appointment for her mammogram in about 3 weeks.  Did encourage her to let them know about the pain and if something changes let us know sooner rather than later.  Return in about 6 months (around 08/31/2023) for Hypertension.    Nani Gasser, MD

## 2023-03-04 ENCOUNTER — Telehealth: Payer: Self-pay

## 2023-03-04 LAB — BASIC METABOLIC PANEL
BUN/Creatinine Ratio: 13 (ref 12–28)
BUN: 13 mg/dL (ref 8–27)
CO2: 24 mmol/L (ref 20–29)
Calcium: 9.7 mg/dL (ref 8.7–10.3)
Chloride: 99 mmol/L (ref 96–106)
Creatinine, Ser: 1.04 mg/dL — ABNORMAL HIGH (ref 0.57–1.00)
Glucose: 97 mg/dL (ref 70–99)
Potassium: 4.6 mmol/L (ref 3.5–5.2)
Sodium: 140 mmol/L (ref 134–144)
eGFR: 59 mL/min/{1.73_m2} — ABNORMAL LOW (ref >59)

## 2023-03-04 LAB — URINALYSIS, ROUTINE W REFLEX MICROSCOPIC
Bilirubin, UA: NEGATIVE
Glucose, UA: NEGATIVE
Ketones, UA: NEGATIVE
Leukocytes,UA: NEGATIVE
Nitrite, UA: NEGATIVE
Protein,UA: NEGATIVE
RBC, UA: NEGATIVE
Specific Gravity, UA: 1.009 (ref 1.005–1.030)
Urobilinogen, Ur: 0.2 mg/dL (ref 0.2–1.0)
pH, UA: 6.5 (ref 5.0–7.5)

## 2023-03-04 NOTE — Progress Notes (Signed)
Hi Sherry, kidney function is stable at 1.0.  Urine sample overall looks good.

## 2023-03-04 NOTE — Telephone Encounter (Signed)
Copied from CRM [id number]. Topic: Clinical - Medical Advice >> Mar 04, 2023 10:46 AM Nila Nephew wrote: Reason for CRM: Patient is extremely concerned about her CKD appointment note. She states that last she was aware, she is in stage one but that she interprets the note as now being stage three and on the verge of dialysis. Patient wants to confirm if this is correct. Patient requesting a call back at [phone]. Patient significantly panicked.

## 2023-03-07 NOTE — Assessment & Plan Note (Signed)
Will monitor

## 2023-03-07 NOTE — Telephone Encounter (Signed)
Please call patient and let her know that she had CKD 2 and CKD 3 on her problem list the 3 was from when her kidney function had dropped temporarily.  When I was clicking her diagnoses for the encounter I accidentally clicked the 3 instead of the 2.  It is still stage II which is far far away from dialysis.  I did just correct her note to make sure that it reflects stage II and removed the 3 from her problem list so we did not accidentally clicked that again.  Her I am sorry for any confusion and her note has been corrected.

## 2023-03-07 NOTE — Telephone Encounter (Signed)
Patient advised.

## 2023-03-08 ENCOUNTER — Ambulatory Visit: Payer: 59 | Admitting: Rehabilitation

## 2023-03-16 ENCOUNTER — Ambulatory Visit: Payer: 59

## 2023-03-16 DIAGNOSIS — I890 Lymphedema, not elsewhere classified: Secondary | ICD-10-CM

## 2023-03-16 DIAGNOSIS — C50411 Malignant neoplasm of upper-outer quadrant of right female breast: Secondary | ICD-10-CM | POA: Diagnosis not present

## 2023-03-16 DIAGNOSIS — M25511 Pain in right shoulder: Secondary | ICD-10-CM

## 2023-03-16 DIAGNOSIS — Z853 Personal history of malignant neoplasm of breast: Secondary | ICD-10-CM
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
## 2023-03-16 NOTE — Patient Instructions (Signed)
Axillary web syndrome (also called cording) can happen after having breast cancer surgery when lymph nodes in the armpit are removed. It presents as if you have a thin cord in your arm and can run from the armpit all the way down into the forearm. If you've had a sentinel node biopsy, the risk is 1-20% and if you've had an axillary lymph node dissection (more than 7 nodes removed), the risk is 36-72%. The ranges vary depending on the research study.  It most often happens 3-4 weeks post-op but can happen sooner or later. There are several possibilities for what cording actually is. Although no one knows for sure as of yet, it may be related to lymphatics, veins, or other tissue. Sometimes cording resolves on its own but other times it requires physical therapy with a therapist who specializes in lymphedema and/or cancer rehab. Treatment typically involves stretching, manual techniques, and exercise. Sometimes cords get "released" while stretching or during manual treatment and the patient may experience the sensation of a "pop." This may feel strange but it is not dangerous and is a sign that the cord has released; range of motion may be improved in the process.

## 2023-03-19 ENCOUNTER — Other Ambulatory Visit: Payer: Self-pay | Admitting: Family Medicine

## 2023-03-19 DIAGNOSIS — E039 Hypothyroidism, unspecified: Secondary | ICD-10-CM

## 2023-03-23 ENCOUNTER — Ambulatory Visit: Payer: 59 | Attending: Surgery

## 2023-03-23 DIAGNOSIS — Z170 Estrogen receptor positive status [ER+]: Secondary | ICD-10-CM | POA: Insufficient documentation

## 2023-03-23 DIAGNOSIS — I890 Lymphedema, not elsewhere classified: Secondary | ICD-10-CM

## 2023-03-23 DIAGNOSIS — C50411 Malignant neoplasm of upper-outer quadrant of right female breast: Secondary | ICD-10-CM | POA: Diagnosis present

## 2023-03-23 DIAGNOSIS — Z853 Personal history of malignant neoplasm of breast: Secondary | ICD-10-CM

## 2023-03-23 DIAGNOSIS — M25511 Pain in right shoulder: Secondary | ICD-10-CM

## 2023-03-23 DIAGNOSIS — M25512 Pain in left shoulder: Secondary | ICD-10-CM | POA: Insufficient documentation

## 2023-03-23 NOTE — Patient Instructions (Addendum)
 Aaron Aas
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
## 2023-03-25 ENCOUNTER — Ambulatory Visit
Admission: RE | Admit: 2023-03-25 | Discharge: 2023-03-25 | Disposition: A | Discharge status: Home / Self Care | Payer: 59 | Source: Ambulatory Visit | Attending: Surgery | Admitting: Surgery

## 2023-03-25 ENCOUNTER — Other Ambulatory Visit: Payer: Self-pay | Admitting: Surgery

## 2023-03-25 DIAGNOSIS — Z853 Personal history of malignant neoplasm of breast: Secondary | ICD-10-CM

## 2023-03-28 ENCOUNTER — Telehealth: Payer: Self-pay

## 2023-03-28 ENCOUNTER — Other Ambulatory Visit: Payer: Self-pay

## 2023-03-28 DIAGNOSIS — D0512 Intraductal carcinoma in situ of left breast: Secondary | ICD-10-CM

## 2023-03-28 DIAGNOSIS — Z170 Estrogen receptor positive status [ER+]: Secondary | ICD-10-CM

## 2023-03-28 NOTE — Telephone Encounter (Signed)
 Pt called with concern about need for MRI breast. She states d/t swelling, radiology recommended an MRI to further evaluate. They advised her they would contact our office regarding an order; however, we have not received such order. Recommendation appears to be annual MRI d/t cancer hx. Advised pt I would present this to Dr Gudena for further review with his recommendation. She verbalized thanks and understanding.

## 2023-03-29 ENCOUNTER — Telehealth: Payer: Self-pay | Admitting: *Deleted

## 2023-03-29 NOTE — Telephone Encounter (Signed)
Per MD request, RN placed order for breast MRI. Attempt x1 to contact pt.  No answer, LVM for pt to return call to office.

## 2023-04-04 ENCOUNTER — Ambulatory Visit
Admission: RE | Admit: 2023-04-04 | Discharge: 2023-04-04 | Disposition: A | Discharge status: Home / Self Care | Payer: 59 | Source: Ambulatory Visit | Attending: Hematology and Oncology | Admitting: Hematology and Oncology

## 2023-04-04 ENCOUNTER — Ambulatory Visit: Payer: Self-pay | Admitting: Family Medicine

## 2023-04-04 DIAGNOSIS — D0512 Intraductal carcinoma in situ of left breast: Secondary | ICD-10-CM

## 2023-04-04 DIAGNOSIS — Z170 Estrogen receptor positive status [ER+]: Secondary | ICD-10-CM

## 2023-04-04 DIAGNOSIS — C50212 Malignant neoplasm of upper-inner quadrant of left female breast: Secondary | ICD-10-CM

## 2023-04-04 MED ORDER — GADOPICLENOL 0.5 MMOL/ML IV SOLN
10.0000 mL | Freq: Once | INTRAVENOUS | Status: AC | PRN
  Administered 2023-04-04: 10 mL via INTRAVENOUS

## 2023-04-04 NOTE — Telephone Encounter (Signed)
 Chief Complaint: Pelvic pain Symptoms: Pelvic pain, lower back pain, cramping Frequency: Once, off and on Pertinent Negatives: Patient denies n/v, vaginal bleeding, discharge,  Disposition: [] ED /[] Urgent Care (no appt availability in office) / [x] Appointment(In office/virtual)/ []  [facility]/ [] Home Care/ [] Refused Recommended Disposition /[] [facility]/ []  Follow-up with PCP Additional Notes: Patient called with complaints of cramping pelvic pain that is improved from the sudden sharp pain she experience yesterday. Patient states that she experienced sharp 10/10 pelvic pain yesterday that was resolved with tylenol after an hour, and is now a mild dull cramping sensation. Patient states that she has a history of breast cancer and has higher levels of progesterone and estrogen as a result, but never has had pelvic pain like this prior to. Pain is currently campy-like and dull. Patient states that she also has a herniated disc and had back pain on the lower right to groin where pelvic pain radiated up to. Back pain is mild currently. Patient denies injury, bleeding, vomiting, vaginal discharge, fever. Patient advised by this RN to be seen with 24 hours to which patient was agreeable. Appt scheduled. Patient advised by this RN to call back with worsening symptoms. Patient verbalized understanding   Copied from CRM [id number]. Topic: Clinical - Red Word Triage >> Apr 04, 2023 12:51 PM Fredrica W wrote: Red Word that prompted transfer to Nurse Triage: 3 time cancer survivor pain in ovaries last night like before last l Reason for Disposition  [1] MODERATE (e.g., interferes with normal activities) pelvic pain AND [2] pain comes and goes (cramps) AND [3] present > 24 hours  Answer Assessment - Initial Assessment Questions 1. LOCATION: "Where does it hurt?"      Pelvic 2. RADIATION: "Does the pain shoot anywhere else?" (e.g., lower back, groin, thighs)     It shot up my back a little  up to my groin and lower back to the right." 3. ONSET: "When did the pain begin?" (e.g., minutes, hours or days ago)      Yesterday 4. SUDDEN: "Gradual or sudden onset?"     Sudden 5. PATTERN "Does the pain come and go, or is it constant?"    - If constant: "Is it getting better, staying the same, or worsening?"      (Note: Constant means the pain never goes away completely; most serious pain is constant and gets worse over time)     - If intermittent: "How long does it last?" "Do you have pain now?"     (Note: Intermittent means the pain goes away completely between bouts)     Constant "it lasted for like an hour and then went to a dull cramp. The tylenol took it completely away" 6. SEVERITY: "How bad is the pain?"  (e.g., Scale 1-10; mild, moderate, or severe)   - MILD (1-3): doesn't interfere with normal activities, area soft and not tender to touch    - MODERATE (4-7): interferes with normal activities or awakens from sleep, abdomen tender to touch    - SEVERE (8-10): excruciating pain, doubled over, unable to do any normal activities      Severe yesterday, (2/10 right now) 7. RECURRENT SYMPTOM: "Have you ever had this type of pelvic pain before?" If Yes, ask: "When was the last time?" and "What happened that time?"      Denies 8. CAUSE: "What do you think is causing the pelvic pain?"     "I had breast cancer and now have high progestrone and  estrogen. But I dont know what could have caused that." 9. RELIEVING/AGGRAVATING FACTORS: "What makes it better or worse?" (e.g., activity/rest, sexual intercourse, voiding, passing stool)     Tylenol, 10. OTHER SYMPTOMS: "Has there been any other symptoms?" (e.g., fever, constipation, diarrhea, urine problems, vaginal bleeding, vaginal discharge, or vomiting?"      Denies  Protocols used: Pelvic Pain - Female-A-AH

## 2023-04-05 ENCOUNTER — Ambulatory Visit: Payer: 59 | Admitting: Family Medicine

## 2023-04-06 ENCOUNTER — Ambulatory Visit: Payer: 59

## 2023-04-06 ENCOUNTER — Telehealth: Payer: Self-pay | Admitting: *Deleted

## 2023-04-06 NOTE — Telephone Encounter (Signed)
-----   Message from Tamsen Meek sent at 04/05/2023  4:08 PM EST ----- Please let her know that the breast MRI is normal Thx V ----- Message ----- From: Interface, Rad Results In Sent: 04/04/2023   2:18 PM EST To: [name], MD

## 2023-04-06 NOTE — Telephone Encounter (Signed)
 Per MD request RN placed call to pt with below information.  Pt verbalized understanding.

## 2023-04-11 ENCOUNTER — Telehealth: Payer: Self-pay

## 2023-04-11 NOTE — Telephone Encounter (Signed)
 Returned pt phone call. She had an MRI and the swollen area in her breast is all fat necrosis. She reports the cords are gone and her ROM is back to normal. She no longer has pain with sleeping and she can reach behind her back. She feels ready for discharge. Advised pt I will DC her and cancel remaining appts.

## 2023-04-21 ENCOUNTER — Other Ambulatory Visit: Payer: Self-pay | Admitting: Family Medicine

## 2023-04-21 ENCOUNTER — Other Ambulatory Visit: Payer: Self-pay

## 2023-04-21 DIAGNOSIS — G43109 Migraine with aura, not intractable, without status migrainosus: Secondary | ICD-10-CM

## 2023-04-21 MED ORDER — PROMETHAZINE HCL 25 MG RE SUPP
25.0000 mg | Freq: Four times a day (QID) | RECTAL | 2 refills | Status: AC | PRN
Start: 2023-04-21 — End: ?

## 2023-04-21 MED ORDER — SUMATRIPTAN SUCCINATE 100 MG PO TABS
ORAL_TABLET | ORAL | 3 refills | Status: AC
Start: 2023-04-21 — End: ?

## 2023-04-21 NOTE — Telephone Encounter (Signed)
 Requesting rx rf of phenergan 25mg  supp Last written 11/26/2021 Sumitriptan 100mg   Last written 03/10/2020 Last OV 03/03/2023 Upcoming appt =09/01/2023

## 2023-04-21 NOTE — Telephone Encounter (Signed)
 Copied from CRM [id number]. Topic: Clinical - Medication Refill >> Apr 21, 2023 11:10 AM Gery Pray wrote: Most Recent Primary Care Visit:  Provider: Nani Gasser D  Department: [facility]  Visit Type: OFFICE VISIT  Date: 03/03/2023  Medication: promethazine (PHENERGAN) 25 MG suppository, SUMAtriptan (IMITREX) 100 MG tablet   Has the patient contacted their pharmacy? Yes (Agent: If no, request that the patient contact the pharmacy for the refill. If patient does not wish to contact the pharmacy document the reason why and proceed with request.) (Agent: If yes, when and what did the pharmacy advise?)  Is this the correct pharmacy for this prescription? Yes If no, delete pharmacy and type the correct one.  This is the patient's preferred pharmacy:  [facility] [phone] - [place], [place] - [address] [place] [address] Phone: [phone] Fax: [phone]   Has the prescription been filled recently? No  Is the patient out of the medication? Yes  Has the patient been seen for an appointment in the last year OR does the patient have an upcoming appointment? Yes  Can we respond through MyChart? Yes  Agent: Please be advised that Rx refills may take up to 3 business days. We ask that you follow-up with your pharmacy.

## 2023-06-07 ENCOUNTER — Encounter: Payer: Self-pay | Admitting: Family Medicine

## 2023-06-07 ENCOUNTER — Other Ambulatory Visit: Payer: Self-pay | Admitting: Family Medicine

## 2023-06-07 ENCOUNTER — Ambulatory Visit

## 2023-06-07 ENCOUNTER — Ambulatory Visit (INDEPENDENT_AMBULATORY_CARE_PROVIDER_SITE_OTHER): Admitting: Family Medicine

## 2023-06-07 VITALS — BP 136/80 | HR 95 | Ht 63.0 in | Wt 248.0 lb

## 2023-06-07 DIAGNOSIS — R109 Unspecified abdominal pain: Secondary | ICD-10-CM | POA: Diagnosis not present

## 2023-06-07 DIAGNOSIS — M549 Dorsalgia, unspecified: Secondary | ICD-10-CM

## 2023-06-07 DIAGNOSIS — M79661 Pain in right lower leg: Secondary | ICD-10-CM

## 2023-06-07 DIAGNOSIS — R198 Other specified symptoms and signs involving the digestive system and abdomen: Secondary | ICD-10-CM | POA: Diagnosis not present

## 2023-06-07 DIAGNOSIS — R10A1 Flank pain, right side: Secondary | ICD-10-CM

## 2023-06-07 NOTE — Progress Notes (Signed)
 Acute Office Visit  Subjective:     Patient ID: Margaret Beasley, female    DOB: [birth year], 68 y.o.   MRN: [id number]  Chief Complaint  Patient presents with   Back Pain    HPI Patient is in today for back pain on her right mid back area she feels like it has been getting progressively more consistent it hurts constantly now it is worse with movement such as walking etc. she is really worried it is her kidney.  No recent urinary tract infection type symptoms.  It is tender to touch.  Hot and sharp pain in her right calf that started in the middle of the night last night it actually woke her up out of sleep it starts at the bottom of her calf and then shoots down to her ankle she has not noticed any changes in color or swelling in her foot on that side.  She has noticed a change in her bowel movements lately she says that she has been passing much larger amounts of stool than what she is used to seeing she has not noticed any blood etc.  No recent travel, or long car rides.  Etc.   Also been having a lot of pain and decreased range of motion in her shoulder but she says compared to the her side and her calf muscle this pales in comparison.  ROS      Objective:    BP 136/80   Pulse 95   Ht 5\' 3"  (1.6 m)   Wt 248 lb (112.5 kg)   SpO2 96%   BMI 43.93 kg/m    Physical Exam Vitals and nursing note reviewed.  Constitutional:      Appearance: Normal appearance.  HENT:     Head: Normocephalic and atraumatic.  Eyes:     Conjunctiva/sclera: Conjunctivae normal.  Cardiovascular:     Rate and Rhythm: Normal rate and regular rhythm.  Pulmonary:     Effort: Pulmonary effort is normal.     Breath sounds: Normal breath sounds.  Musculoskeletal:     Comments: Right calf is tender.   Tender over right mid back over the rib area.   Skin:    General: Skin is warm and dry.  Neurological:     Mental Status: She is alert.  Psychiatric:        Mood and Affect: Mood normal.      No results found for any visits on 06/07/23.      Assessment & Plan:   Problem List Items Addressed This Visit   None Visit Diagnoses       Mid back pain on right side    -  Primary   Relevant Orders   DG Thoracic Spine W/Swimmers (Completed)   CMP14+EGFR   Lipid panel   Urine Microalbumin w/creat. ratio   Urinalysis, Routine w reflex microscopic   TSH   CBC with Differential/Platelet   D-Dimer, Quantitative     Change in bowel movement       Relevant Orders   DG Abd 1 View   CMP14+EGFR   Lipid panel   Urine Microalbumin w/creat. ratio   Urinalysis, Routine w reflex microscopic   TSH   CBC with Differential/Platelet   D-Dimer, Quantitative     Right flank pain       Relevant Orders   US  Renal   CMP14+EGFR   Lipid panel   Urine Microalbumin w/creat. ratio   Urinalysis, Routine w reflex microscopic  TSH   CBC with Differential/Platelet   D-Dimer, Quantitative     Right calf pain       Relevant Orders   D-Dimer, Quantitative       Right mid back pain-I think this is more musculoskeletal in nature though it has been progressing so organ to get further workup with thoracic spine films.  She is also very worried about the her kidney.  So we will go ahead and move forward with ultrasound for further workup.  Change in bowel movements-will get a KUB to look for excess stool in the colon that could be contributing even to her back pain.  No abdominal pain currently.  Will check for labs including elevated white blood cell count.  Thyroid  disorder etc. Right calf pain-tender with gentle squeeze on exam today.  No significant swelling of the distal foot or discoloration.  Will get a stat D-dimer for further workup just to rule out DVT though she overall is low risk she has not traveled recently or been excessively sedentary.   No orders of the defined types were placed in this encounter.   No follow-ups on file.  Duaine German, MD

## 2023-06-08 ENCOUNTER — Ambulatory Visit (INDEPENDENT_AMBULATORY_CARE_PROVIDER_SITE_OTHER)

## 2023-06-08 ENCOUNTER — Encounter: Payer: Self-pay | Admitting: Family Medicine

## 2023-06-08 DIAGNOSIS — R109 Unspecified abdominal pain: Secondary | ICD-10-CM | POA: Diagnosis not present

## 2023-06-08 NOTE — Progress Notes (Signed)
 Hi Lauralie, metabolic panel looks good.  LDL and total cholesterol and triglycerides are up a little bit continue to work on healthy diet.  Thyroid  level slightly elevated it looked perfect 7 months ago but it is up to 4.6 this time.  Blood count is normal.  I think it did this before and then went back down so I wanted to keep a close eye on it and plan to recheck again in about 8 weeks.

## 2023-06-08 NOTE — Progress Notes (Signed)
 HI Margaret Beasley, got one of the images back.  The 1 on your back actually looks okay.  No sign of vertebral fracture.  Normal alignment of the spine.  No bony mass or anything like that.  You do have a little bit of arthritis in your spine.

## 2023-06-10 LAB — URINALYSIS, ROUTINE W REFLEX MICROSCOPIC
Bilirubin, UA: NEGATIVE
Glucose, UA: NEGATIVE
Ketones, UA: NEGATIVE
Leukocytes,UA: NEGATIVE
Nitrite, UA: NEGATIVE
Protein,UA: NEGATIVE
RBC, UA: NEGATIVE
Specific Gravity, UA: 1.007 (ref 1.005–1.030)
Urobilinogen, Ur: 0.2 mg/dL (ref 0.2–1.0)
pH, UA: 6 (ref 5.0–7.5)

## 2023-06-10 LAB — CBC WITH DIFFERENTIAL/PLATELET
Basophils Absolute: 0 10*3/uL (ref 0.0–0.2)
Basos: 0 %
EOS (ABSOLUTE): 0.2 10*3/uL (ref 0.0–0.4)
Eos: 4 %
Hematocrit: 45.5 % (ref 34.0–46.6)
Hemoglobin: 15.3 g/dL (ref 11.1–15.9)
Immature Grans (Abs): 0 10*3/uL (ref 0.0–0.1)
Immature Granulocytes: 0 %
Lymphocytes Absolute: 1.5 10*3/uL (ref 0.7–3.1)
Lymphs: 30 %
MCH: 32.2 pg (ref 26.6–33.0)
MCHC: 33.6 g/dL (ref 31.5–35.7)
MCV: 96 fL (ref 79–97)
Monocytes Absolute: 0.6 10*3/uL (ref 0.1–0.9)
Monocytes: 12 %
Neutrophils Absolute: 2.7 10*3/uL (ref 1.4–7.0)
Neutrophils: 54 %
Platelets: 256 10*3/uL (ref 150–450)
RBC: 4.75 x10E6/uL (ref 3.77–5.28)
RDW: 12.9 % (ref 11.7–15.4)
WBC: 5 10*3/uL (ref 3.4–10.8)

## 2023-06-10 LAB — CMP14+EGFR
ALT: 27 IU/L (ref 0–32)
AST: 22 IU/L (ref 0–40)
Albumin: 4.3 g/dL (ref 3.9–4.9)
Alkaline Phosphatase: 79 IU/L (ref 44–121)
BUN/Creatinine Ratio: 14 (ref 12–28)
BUN: 12 mg/dL (ref 8–27)
Bilirubin Total: 0.3 mg/dL (ref 0.0–1.2)
CO2: 26 mmol/L (ref 20–29)
Calcium: 9.6 mg/dL (ref 8.7–10.3)
Chloride: 100 mmol/L (ref 96–106)
Creatinine, Ser: 0.88 mg/dL (ref 0.57–1.00)
Globulin, Total: 2.4 g/dL (ref 1.5–4.5)
Glucose: 100 mg/dL — ABNORMAL HIGH (ref 70–99)
Potassium: 4.8 mmol/L (ref 3.5–5.2)
Sodium: 139 mmol/L (ref 134–144)
Total Protein: 6.7 g/dL (ref 6.0–8.5)
eGFR: 72 mL/min/{1.73_m2} (ref >59)

## 2023-06-10 LAB — MICROALBUMIN / CREATININE URINE RATIO: Creatinine, Urine: 32 mg/dL

## 2023-06-10 LAB — TSH: TSH: 4.66 u[IU]/mL — ABNORMAL HIGH (ref 0.450–4.500)

## 2023-06-10 LAB — LIPID PANEL
Chol/HDL Ratio: 3.9 ratio (ref 0.0–4.4)
Cholesterol, Total: 213 mg/dL — ABNORMAL HIGH (ref 100–199)
HDL: 55 mg/dL (ref >39)
LDL Chol Calc (NIH): 112 mg/dL — ABNORMAL HIGH (ref 0–99)
Triglycerides: 269 mg/dL — ABNORMAL HIGH (ref 0–149)
VLDL Cholesterol Cal: 46 mg/dL — ABNORMAL HIGH (ref 5–40)

## 2023-06-10 NOTE — Progress Notes (Signed)
 No excess protein in the urine which is good.

## 2023-06-17 ENCOUNTER — Encounter: Payer: Self-pay | Admitting: Family Medicine

## 2023-06-17 NOTE — Progress Notes (Signed)
  Great  news ultrasound looks okay they do not see any concerning findings in the kidney or hydroureter.  No mass etc.

## 2023-06-23 ENCOUNTER — Ambulatory Visit: Admitting: Sports Medicine

## 2023-07-15 ENCOUNTER — Telehealth: Payer: Self-pay | Admitting: Family Medicine

## 2023-07-15 DIAGNOSIS — E049 Nontoxic goiter, unspecified: Secondary | ICD-10-CM

## 2023-07-15 NOTE — Telephone Encounter (Signed)
 Copied from CRM [id number]. Topic: Appointments - Scheduling Inquiry for Clinic >> Jul 15, 2023 11:41 AM Tisa Forester wrote: Reason for CRM: Patient requesting Bertell Broach to call her back regarding needing a followup to rerun her blood work from the last time , Duaine German want patient to come back in to rerun blood work .  Patient call back number  [phone]

## 2023-07-19 NOTE — Telephone Encounter (Signed)
 Pt called and informed that lab has been ordered.

## 2023-09-01 ENCOUNTER — Encounter: Payer: Self-pay | Admitting: Family Medicine

## 2023-09-01 ENCOUNTER — Ambulatory Visit (INDEPENDENT_AMBULATORY_CARE_PROVIDER_SITE_OTHER): Payer: 59 | Admitting: Family Medicine

## 2023-09-01 VITALS — BP 127/63 | HR 94 | Ht 63.0 in | Wt 243.0 lb

## 2023-09-01 DIAGNOSIS — E559 Vitamin D deficiency, unspecified: Secondary | ICD-10-CM | POA: Diagnosis not present

## 2023-09-01 DIAGNOSIS — M7989 Other specified soft tissue disorders: Secondary | ICD-10-CM

## 2023-09-01 DIAGNOSIS — N182 Chronic kidney disease, stage 2 (mild): Secondary | ICD-10-CM | POA: Diagnosis not present

## 2023-09-01 DIAGNOSIS — R251 Tremor, unspecified: Secondary | ICD-10-CM | POA: Insufficient documentation

## 2023-09-01 DIAGNOSIS — E039 Hypothyroidism, unspecified: Secondary | ICD-10-CM | POA: Diagnosis not present

## 2023-09-01 DIAGNOSIS — I10 Essential (primary) hypertension: Secondary | ICD-10-CM | POA: Diagnosis not present

## 2023-09-01 DIAGNOSIS — R5383 Other fatigue: Secondary | ICD-10-CM

## 2023-09-01 DIAGNOSIS — R2232 Localized swelling, mass and lump, left upper limb: Secondary | ICD-10-CM

## 2023-09-01 DIAGNOSIS — Z853 Personal history of malignant neoplasm of breast: Secondary | ICD-10-CM

## 2023-09-01 DIAGNOSIS — R634 Abnormal weight loss: Secondary | ICD-10-CM

## 2023-09-01 NOTE — Assessment & Plan Note (Signed)
 Pressure looks fantastic today.  Continue current regimen.

## 2023-09-01 NOTE — Assessment & Plan Note (Signed)
 Last vitamin D  level was low due to recheck today.

## 2023-09-01 NOTE — Progress Notes (Signed)
 Established Patient Office Visit  Subjective  Patient ID: Margaret Beasley, female    DOB: 1955/06/23  Age: 68 y.o. MRN: [id number]  Chief Complaint  Patient presents with   Hypertension   Hypothyroidism    HPI  Diabetes - no hypoglycemic events. No wounds or sores that are not healing well. No increased thirst or urination. Checking glucose at home. Taking medications as prescribed without any side effects.  Restarted thyroid  pill after last TSH was elevated.  So she has been back on that and due to recheck her TSH today.  She says she just feels like something is off about 4 months ago to she noticed some redness and swelling in her left inner arm towards the axilla.  She did follow-up with a Careers adviser.  Last breast MRI and imaging was in February.  Right around the time that she had the imaging done.  She feels like she is losing weight abnormally she is down about 5 pounds since I saw her in April but says she has not changed anything regards to her diet or activity level she just feels extremely tired.  She says it is hard to get out of the bed in the mornings and then by the afternoon she ends up having to lay down for a couple hours because she just feels completely exhausted.  Is noticed a tremor and she feels like it has been getting worse and so she would like a referral to neurology.    ROS    Objective:     BP 127/63   Pulse 94   Ht 5' 3 (1.6 m)   Wt 243 lb (110.2 kg)   SpO2 95%   BMI 43.05 kg/m    Physical Exam Vitals and nursing note reviewed. Exam conducted with a chaperone present.  Constitutional:      Appearance: Normal appearance.  HENT:     Head: Normocephalic and atraumatic.  Eyes:     Conjunctiva/sclera: Conjunctivae normal.  Cardiovascular:     Rate and Rhythm: Normal rate and regular rhythm.  Pulmonary:     Effort: Pulmonary effort is normal.     Breath sounds: Normal breath sounds.  Chest:     Chest wall: No mass.  Breasts:    Right: No  skin change.     Left: Skin change present.       Comments: On her upper inner arm just towards the axilla on the left side there is an area almost like a pocket that feels like it has got some fullness.  No induration.  It has a pink/purple discoloration it is approximately 8 to 10 cm in size.  There is a little bit of fullness in the left axilla but I do not feel a discrete lymph node that feels hard or significantly enlarged greater than a centimeter.  Her old incision over that left upper breast is well-healed and nontender and no induration on exam.  Right axilla with normal exam. Lymphadenopathy:     Upper Body:     Right upper body: No supraclavicular, axillary or pectoral adenopathy.     Left upper body: No supraclavicular, axillary or pectoral adenopathy.  Skin:    General: Skin is warm and dry.  Neurological:     Mental Status: She is alert.  Psychiatric:        Mood and Affect: Mood normal.      No results found for any visits on 09/01/23.    The 10-year ASCVD risk  score (Arnett DK, et al., 2019) is: 9.3%    Assessment & Plan:   Problem List Items Addressed This Visit       Cardiovascular and Mediastinum   Essential hypertension - Primary   Pressure looks fantastic today.  Continue current regimen.      Relevant Orders   TSH   Hemoglobin A1c     Endocrine   Hypothyroid   She had restarted her levo 50mcg last time after TSH came back slightly elevated.        Relevant Medications   levothyroxine  (SYNTHROID ) 50 MCG tablet   Other Relevant Orders   TSH   Hemoglobin A1c     Genitourinary   CKD (chronic kidney disease) stage 2, GFR 60-89 ml/min   Relevant Orders   TSH   Hemoglobin A1c     Other   Vitamin D  deficiency   Last vitamin D  level was low due to recheck today.      Relevant Orders   VITAMIN D  25 Hydroxy (Vit-D Deficiency, Fractures)   Tremor   Relevant Orders   Ambulatory referral to Neurology   Other Visit Diagnoses       Other  fatigue         Left axillary swelling       Relevant Orders   CBC with Differential/Platelet     Abnormal weight loss       Relevant Orders   CT Chest Wo Contrast     Arm mass, left       Relevant Orders   US  MiscellaneoUS Localization   CT Chest Wo Contrast     History of breast cancer       Relevant Orders   CT Chest Wo Contrast       Left arm upper arm swelling and left axillary fullness-she just had a follow-up diagnostic mammogram as well as breast MRI performed in February so approximately 5 months ago.  Will get ultrasound for further evaluation of that area as she has had lymph nodes removed from that axilla but is not typical of what we would see of lymphedema it also does not look like cellulitis.  Prior history of breast cancer recent weight loss and recent onset of swelling in that left upper arm I am concerned.  Again we will start with an ultrasound she may need further workup with CT of her chest.  Return in about 6 months (around 03/03/2024) for thyroid ,bp.    Dorothyann Byars, MD

## 2023-09-01 NOTE — Assessment & Plan Note (Signed)
 She had restarted her levo 50mcg last time after TSH came back slightly elevated.

## 2023-09-02 ENCOUNTER — Telehealth: Payer: Self-pay | Admitting: Family Medicine

## 2023-09-02 ENCOUNTER — Other Ambulatory Visit: Payer: Self-pay | Admitting: Family Medicine

## 2023-09-02 ENCOUNTER — Ambulatory Visit: Payer: Self-pay | Admitting: Family Medicine

## 2023-09-02 ENCOUNTER — Other Ambulatory Visit: Payer: Self-pay | Admitting: *Deleted

## 2023-09-02 DIAGNOSIS — E559 Vitamin D deficiency, unspecified: Secondary | ICD-10-CM

## 2023-09-02 DIAGNOSIS — E039 Hypothyroidism, unspecified: Secondary | ICD-10-CM

## 2023-09-02 LAB — CBC WITH DIFFERENTIAL/PLATELET
Basophils Absolute: 0 x10E3/uL (ref 0.0–0.2)
Basos: 1 %
EOS (ABSOLUTE): 0.2 x10E3/uL (ref 0.0–0.4)
Eos: 4 %
Hematocrit: 46.6 % (ref 34.0–46.6)
Hemoglobin: 14.9 g/dL (ref 11.1–15.9)
Immature Grans (Abs): 0 x10E3/uL (ref 0.0–0.1)
Immature Granulocytes: 0 %
Lymphocytes Absolute: 1.3 x10E3/uL (ref 0.7–3.1)
Lymphs: 29 %
MCH: 31 pg (ref 26.6–33.0)
MCHC: 32 g/dL (ref 31.5–35.7)
MCV: 97 fL (ref 79–97)
Monocytes Absolute: 0.6 x10E3/uL (ref 0.1–0.9)
Monocytes: 13 %
Neutrophils Absolute: 2.4 x10E3/uL (ref 1.4–7.0)
Neutrophils: 53 %
Platelets: 224 x10E3/uL (ref 150–450)
RBC: 4.81 x10E6/uL (ref 3.77–5.28)
RDW: 12.4 % (ref 11.7–15.4)
WBC: 4.4 x10E3/uL (ref 3.4–10.8)

## 2023-09-02 LAB — HEMOGLOBIN A1C
Est. average glucose Bld gHb Est-mCnc: 117 mg/dL
Hgb A1c MFr Bld: 5.7 % — ABNORMAL HIGH (ref 4.8–5.6)

## 2023-09-02 LAB — VITAMIN D 25 HYDROXY (VIT D DEFICIENCY, FRACTURES): Vit D, 25-Hydroxy: 22.9 ng/mL — ABNORMAL LOW (ref 30.0–100.0)

## 2023-09-02 LAB — TSH: TSH: 2.17 u[IU]/mL (ref 0.450–4.500)

## 2023-09-02 MED ORDER — VITAMIN D (ERGOCALCIFEROL) 1.25 MG (50000 UNIT) PO CAPS: 50000.0000 [IU] | ORAL_CAPSULE | ORAL | 1 refills | Status: DC

## 2023-09-02 NOTE — Progress Notes (Signed)
 Hi Margaret Beasley, A1c 5.7 so looking good.  Vitamin D  still low.  I do not remember how much you are taking but let me know and I can increase your dose.  Are you taking the prescription version?  If not, we might could start with that to try to get your numbers up.  Thyroid  looks great at 2.1 in fact it looks better than it did 2 months ago so glad you restarted your medication.  Blood count is normal no sign of infection or anemia or leukemia.  I am working on try to get some imaging set up for you as well.  Just let me know about the vitamin D .

## 2023-09-02 NOTE — Telephone Encounter (Signed)
Meds ordered this encounter  Medications  . Vitamin D, Ergocalciferol, (DRISDOL) 1.25 MG (50000 UNIT) CAPS capsule    Sig: Take 1 capsule (50,000 Units total) by mouth every 7 (seven) days.    Dispense:  12 capsule    Refill:  1

## 2023-09-02 NOTE — Telephone Encounter (Signed)
 OKay, then I would encourage her to follow-up with him about the tremor.  I did not realize that she was still working with a neurologist so I apologize

## 2023-09-02 NOTE — Telephone Encounter (Signed)
 Patient stated that she would follow up with her neurologist and she also says thank you!

## 2023-09-02 NOTE — Telephone Encounter (Signed)
 Copied from CRM [id number]. Topic: Referral - Status >> Sep 02, 2023  2:12 PM Susanna ORN wrote: Reason for CRM: Patient called stating that Dr. Alvan had sent an ambulatory referral to a neurologist on [facility]. Patient wants to let Dr. Alvan know that she already has a neurologist. His name is Dr. Norleen Blumenthal on [facility]. She states if there is any further questions, please give her a call. CB #: [phone].

## 2023-09-05 ENCOUNTER — Ambulatory Visit (INDEPENDENT_AMBULATORY_CARE_PROVIDER_SITE_OTHER)

## 2023-09-05 DIAGNOSIS — Z853 Personal history of malignant neoplasm of breast: Secondary | ICD-10-CM | POA: Diagnosis not present

## 2023-09-05 DIAGNOSIS — R634 Abnormal weight loss: Secondary | ICD-10-CM | POA: Diagnosis not present

## 2023-09-05 DIAGNOSIS — R2232 Localized swelling, mass and lump, left upper limb: Secondary | ICD-10-CM

## 2023-09-08 ENCOUNTER — Telehealth: Payer: Self-pay

## 2023-09-08 NOTE — Telephone Encounter (Signed)
 Copied from CRM #[id number]. Topic: General - Other >> Sep 08, 2023  3:27 PM Miquel SAILOR wrote: Reason for CRM: CT Chest Wo Contrast (Accession [id number]) (Order [id number]) US  LT UPPER EXTREM LTD SOFT TISSUE NON VASCULAR (Accession [id number]) (Order [phone] calling on results from 07/21. Needs call back  [phone]

## 2023-09-12 NOTE — Progress Notes (Signed)
 Hi Shanira, ultrasound near the axilla shows no focal mass which is great.  That means it is probably just more fatty tissue that is blending in.  But they did not see a discrete abscess, cyst, or lymph node.

## 2023-09-13 NOTE — Progress Notes (Signed)
 HI Margaret Beasley,  CT of the chest no concerning findings in regard to the heart.  No enlarged lymph nodes that would indicate any type of inflammation or infection or metastatic cancer.  Thyroid  gland and trachea and esophagus also look good.  Lungs are clear no sign of fluid or infection.  They did get a picture just of the upper upper abdomen and they did not see anything concerning there.  Of course they noted the bilateral lumpectomy and where the lymph nodes were removed but again no concerning or worrisome findings.

## 2023-09-19 ENCOUNTER — Ambulatory Visit: Payer: 59 | Admitting: Hematology and Oncology

## 2023-09-25 ENCOUNTER — Other Ambulatory Visit: Payer: Self-pay | Admitting: Family Medicine

## 2023-09-25 DIAGNOSIS — I10 Essential (primary) hypertension: Secondary | ICD-10-CM

## 2023-10-18 ENCOUNTER — Encounter: Payer: Self-pay | Admitting: Sports Medicine

## 2023-10-25 ENCOUNTER — Ambulatory Visit: Payer: Self-pay

## 2023-10-25 NOTE — Telephone Encounter (Signed)
 Patient scheduled.

## 2023-10-25 NOTE — Telephone Encounter (Signed)
 FYI Only or Action Required?: Action required by provider: request for appointment.  Patient was last seen in primary care on 09/01/2023 by Margaret Dorothyann BIRCH, MD.  Called Nurse Triage reporting Back Pain.  Symptoms began today.  Interventions attempted: Nothing.  Symptoms are: unchanged.  Triage Disposition: See PCP When Office is Open (Within 3 Days)  Patient/caregiver understands and will follow disposition?: YesCopied from CRM #[id number]. Topic: Clinical - Red Word Triage >> Oct 25, 2023  1:18 PM Margaret Beasley wrote: Red Word that prompted transfer to Nurse Triage: Patient states she has chronic kidney disease. States last night she started having a metallic & ammonia smell. She's also having constipation, nausea, & a headache that's off and on for 3 days. Pain around lower waist & back. Reason for Disposition  [1] MODERATE back pain (e.g., interferes with normal activities) AND [2] present > 3 days  Answer Assessment - Initial Assessment Questions Pt has chronic kidney disease. Pt wants EGFR blood test to see if kidney disease is progessing. Pt noticed metallic taste and ammonia smell after using bathroom during night. Pt has burning sensation 3-4 inches below navel.       1. ONSET: When did the pain begin? (e.g., minutes, hours, days)     Last night 2. LOCATION: Where does it hurt? (upper, mid or lower back)     Lower back 3. SEVERITY: How bad is the pain?  (e.g., Scale 1-10; mild, moderate, or severe)     0 4. PATTERN: Is the pain constant? (e.g., yes, no; constant, intermittent)      Comes and goes 5. RADIATION: Does the pain shoot into your legs or somewhere else?     denies 6. CAUSE:  What do you think is causing the back pain?      Kidneys worsening  7. BACK OVERUSE:  Any recent lifting of heavy objects, strenuous work or exercise?     na 8. MEDICINES: What have you taken so far for the pain? (e.g., nothing, acetaminophen , NSAIDS)     denies 9.  NEUROLOGIC SYMPTOMS: Do you have any weakness, numbness, or problems with bowel/bladder control?     denies 10. OTHER SYMPTOMS: Do you have any other symptoms? (e.g., fever, abdomen pain, burning with urination, blood in urine) Nausea, headache, constipated  Protocols used: Back Pain-A-AH

## 2023-10-27 ENCOUNTER — Encounter: Payer: Self-pay | Admitting: Family Medicine

## 2023-10-27 ENCOUNTER — Ambulatory Visit (INDEPENDENT_AMBULATORY_CARE_PROVIDER_SITE_OTHER): Admitting: Family Medicine

## 2023-10-27 VITALS — BP 133/77 | HR 91 | Ht 63.0 in | Wt 242.0 lb

## 2023-10-27 DIAGNOSIS — G43809 Other migraine, not intractable, without status migrainosus: Secondary | ICD-10-CM | POA: Diagnosis not present

## 2023-10-27 DIAGNOSIS — R1030 Lower abdominal pain, unspecified: Secondary | ICD-10-CM | POA: Diagnosis not present

## 2023-10-27 DIAGNOSIS — R110 Nausea: Secondary | ICD-10-CM

## 2023-10-27 DIAGNOSIS — N182 Chronic kidney disease, stage 2 (mild): Secondary | ICD-10-CM

## 2023-10-27 DIAGNOSIS — R431 Parosmia: Secondary | ICD-10-CM

## 2023-10-27 NOTE — Progress Notes (Signed)
 Pt reports that she was experiencing low back pain 3-4 nights ago. She stated she was constipated but that has gone away. She reports smelling a metallic smell

## 2023-10-27 NOTE — Progress Notes (Signed)
 Acute Office Visit  Subjective:     Patient ID: Margaret Beasley, female    DOB: [birth year], 68 y.o.   MRN: [id number]  Chief Complaint  Patient presents with   Back Pain    HPI Patient is in today for   Discussed the use of AI scribe software for clinical note transcription with the patient, who gave verbal consent to proceed.  History of Present Illness Margaret Beasley is a 68 year old female with chronic kidney disease who presents with a four-day history of headaches, nausea, and unusual smell perception.  Cephalgia and associated neurological symptoms - Four-day history of severe headache located at the top of the head - Headache is severe enough to wake her from sleep - History of migraines; current headache is similar to prior episodes - No visual aura preceding this episode, though visual auras have occurred with previous migraines - Persistent unusual metallic smell, described as similar to 'grease and oil', present throughout the episode - Particularly concerned about the unusual smell and its potential relation to her headaches - No visual changes reported  Nausea and gastrointestinal symptoms - Nausea present for four days, intermittent and sometimes severe enough to cause sensation of impending vomiting - No vomiting reported - Constipation began concurrently with headache; initial urge to defecate without passage of stool - Burning sensation around lower abdomen and back on the second day of illness, now resolved - Normal bowel movement today, first relief from constipation in four days - Burning sensation in throat - No unusual changes in diet or routine  Systemic symptoms - Chills and sweats present during the episode - No fever reported - Increased fatigue compared to baseline  Chronic medical conditions and monitoring - Chronic kidney disease - Very attentive to changes in urine and bowel movements - History of hernia  Medication use and response -  Did not take usual migraine medication (Imitrex ) during this episode - Used cold drinks for symptomatic relief  No major dietary changes, no fever or chills, occasional sweats.  ROS      Objective:    BP 133/77   Pulse 91   Ht 5' 3 (1.6 m)   Wt 242 lb (109.8 kg)   SpO2 94%   BMI 42.87 kg/m    Physical Exam Vitals and nursing note reviewed.  Constitutional:      Appearance: Normal appearance.  HENT:     Head: Normocephalic and atraumatic.  Eyes:     Conjunctiva/sclera: Conjunctivae normal.  Cardiovascular:     Rate and Rhythm: Normal rate and regular rhythm.  Pulmonary:     Effort: Pulmonary effort is normal.     Breath sounds: Normal breath sounds.  Skin:    General: Skin is warm and dry.  Neurological:     Mental Status: She is alert.  Psychiatric:        Mood and Affect: Mood normal.     No results found for any visits on 10/27/23.      Assessment & Plan:   Problem List Items Addressed This Visit       Cardiovascular and Mediastinum   Migraine   Relevant Orders   CBC with Differential/Platelet   CMP14+EGFR   Urinalysis, Routine w reflex microscopic   MR Brain W Wo Contrast     Genitourinary   CKD (chronic kidney disease) stage 2, GFR 60-89 ml/min   Relevant Orders   CBC with Differential/Platelet   CMP14+EGFR   Urinalysis, Routine w reflex  microscopic   MR Brain W Wo Contrast   Other Visit Diagnoses       Abnormal smell    -  Primary   Relevant Orders   CBC with Differential/Platelet   CMP14+EGFR   Urinalysis, Routine w reflex microscopic   MR Brain W Wo Contrast     Lower abdominal pain       Relevant Orders   CBC with Differential/Platelet   CMP14+EGFR   Urinalysis, Routine w reflex microscopic   MR Brain W Wo Contrast     Nausea       Relevant Orders   MR Brain W Wo Contrast       Assessment and Plan Assessment & Plan 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 History of migraines and seizures raises  concern for new aura type and potential seizures. - Order brain MRI to evaluate for changes or abnormalities. - Advised Phenergan  and Imitrex  for symptom management if headache recurs. - Perform blood work today. - Schedule brain MRI.  Nausea, recurrent Recurrent nausea associated with migraines, possibly related to chronic kidney disease. - Ensure availability of Phenergan  for nausea management.  Chronic kidney disease - check renal function today  Metallic smell reported, evaluate renal function to rule out exacerbation. - Perform blood work to assess renal function and check hemoglobin levels. - could be a form of an aura.      No orders of the defined types were placed in this encounter.   Return if symptoms worsen or fail to improve.  Margaret Byars, MD

## 2023-10-28 ENCOUNTER — Ambulatory Visit: Payer: Self-pay | Admitting: Family Medicine

## 2023-10-28 DIAGNOSIS — N1832 Chronic kidney disease, stage 3b: Secondary | ICD-10-CM

## 2023-10-28 LAB — URINALYSIS, ROUTINE W REFLEX MICROSCOPIC
Bilirubin, UA: NEGATIVE
Glucose, UA: NEGATIVE
Ketones, UA: NEGATIVE
Leukocytes,UA: NEGATIVE
Nitrite, UA: NEGATIVE
Protein,UA: NEGATIVE
RBC, UA: NEGATIVE
Specific Gravity, UA: 1.006 (ref 1.005–1.030)
Urobilinogen, Ur: 0.2 mg/dL (ref 0.2–1.0)
pH, UA: 6 (ref 5.0–7.5)

## 2023-10-28 LAB — CMP14+EGFR
ALT: 18 IU/L (ref 0–32)
AST: 22 IU/L (ref 0–40)
Albumin: 4.4 g/dL (ref 3.9–4.9)
Alkaline Phosphatase: 77 IU/L (ref 44–121)
BUN/Creatinine Ratio: 9 — ABNORMAL LOW (ref 12–28)
BUN: 9 mg/dL (ref 8–27)
Bilirubin Total: 0.4 mg/dL (ref 0.0–1.2)
CO2: 24 mmol/L (ref 20–29)
Calcium: 9.3 mg/dL (ref 8.7–10.3)
Chloride: 99 mmol/L (ref 96–106)
Creatinine, Ser: 1.04 mg/dL — ABNORMAL HIGH (ref 0.57–1.00)
Globulin, Total: 2.4 g/dL (ref 1.5–4.5)
Glucose: 91 mg/dL (ref 70–99)
Potassium: 5 mmol/L (ref 3.5–5.2)
Sodium: 137 mmol/L (ref 134–144)
Total Protein: 6.8 g/dL (ref 6.0–8.5)
eGFR: 59 mL/min/1.73 — ABNORMAL LOW (ref >59)

## 2023-10-28 LAB — CBC WITH DIFFERENTIAL/PLATELET
Basophils Absolute: 0 x10E3/uL (ref 0.0–0.2)
Basos: 1 %
EOS (ABSOLUTE): 0.2 x10E3/uL (ref 0.0–0.4)
Eos: 5 %
Hematocrit: 45.2 % (ref 34.0–46.6)
Hemoglobin: 15 g/dL (ref 11.1–15.9)
Immature Grans (Abs): 0 x10E3/uL (ref 0.0–0.1)
Immature Granulocytes: 0 %
Lymphocytes Absolute: 1.4 x10E3/uL (ref 0.7–3.1)
Lymphs: 30 %
MCH: 32 pg (ref 26.6–33.0)
MCHC: 33.2 g/dL (ref 31.5–35.7)
MCV: 96 fL (ref 79–97)
Monocytes Absolute: 0.6 x10E3/uL (ref 0.1–0.9)
Monocytes: 14 %
Neutrophils Absolute: 2.2 x10E3/uL (ref 1.4–7.0)
Neutrophils: 50 %
Platelets: 234 x10E3/uL (ref 150–450)
RBC: 4.69 x10E6/uL (ref 3.77–5.28)
RDW: 12.9 % (ref 11.7–15.4)
WBC: 4.5 x10E3/uL (ref 3.4–10.8)

## 2023-10-28 NOTE — Progress Notes (Signed)
 Hi Dyani,  Kidney function is stable at 1.0.  Liver enzymes are normal.  Blood count looks great.  Urine sample looks okay as well no concerning findings.

## 2023-10-31 ENCOUNTER — Other Ambulatory Visit

## 2023-10-31 ENCOUNTER — Telehealth: Payer: Self-pay

## 2023-10-31 NOTE — Telephone Encounter (Signed)
 Copied from CRM [id number]. Topic: Clinical - Medical Advice >> Oct 31, 2023 10:00 AM Cherylann RAMAN wrote: Reason for CRM: Patient states that provider may have overlooked some paperwork. 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 Patient would like for Dr. Alvan to refer to Nephrology. Please contact patient at [phone]. Patient is requesting Dr. Alvan to call her.

## 2023-11-01 ENCOUNTER — Other Ambulatory Visit: Payer: Self-pay

## 2023-11-01 NOTE — Progress Notes (Unsigned)
 I am confused.  Why does she want a nephrology referral?  Most nephrologist will not consult unless you are stage IV or if something is changing rapidly.  Again based on most of her lab results she typically falls more in the 2 range once in a while should her GFR drops below 60 but not often

## 2023-11-01 NOTE — Telephone Encounter (Signed)
 LM for pt to return call , regards to her question and problem list.

## 2023-11-01 NOTE — Telephone Encounter (Signed)
 I see where we have CKD 2 on her problem list I can definitely update that to CKD 3.  I am not sure where the CKD 1 is notated?  If she able to clarify where she saw that.  Again I can definitely get the problem list updated for CKD 3.  I think at 1 point her GFR was staying above 60 so we had it listed as CKD 2.

## 2023-11-02 NOTE — Telephone Encounter (Signed)
Okay, that sounds good

## 2023-11-02 NOTE — Telephone Encounter (Signed)
 Okay, now I understand where she got confused.  Yet the 1 was technically just her creatinine.  It was not a staging for chronic kidney disease.  Just wanted clarify to she does not have kidney failure so I really do not want her to think that.  And she is not at the point that we would refer to nephrology in fact they will may not even provide a consultation based on her GFR where it is at.  It is impaired but it is not significantly abnormal enough that they will do a consult.

## 2023-11-02 NOTE — Telephone Encounter (Signed)
 Spoke with patient and states she was called by Koren Fetters today and she was told that she had been referred to Nephrologist.  She would like to go ahead and keep the nephrology referral.

## 2023-11-23 LAB — LAB REPORT - SCANNED
Albumin, Urine POC: <3
Albumin/Creatinine Ratio, Urine, POC: <9
Creatinine, POC: 34.3 mg/dL
EGFR: 58

## 2023-12-31 ENCOUNTER — Other Ambulatory Visit: Payer: Self-pay | Admitting: Family Medicine

## 2024-02-13 ENCOUNTER — Other Ambulatory Visit: Payer: Self-pay | Admitting: Family Medicine

## 2024-02-13 DIAGNOSIS — Z9889 Other specified postprocedural states: Secondary | ICD-10-CM

## 2024-02-27 ENCOUNTER — Other Ambulatory Visit: Payer: Self-pay | Admitting: Family Medicine

## 2024-02-27 DIAGNOSIS — E039 Hypothyroidism, unspecified: Secondary | ICD-10-CM

## 2024-02-28 ENCOUNTER — Ambulatory Visit: Admitting: Family Medicine

## 2024-03-11 ENCOUNTER — Other Ambulatory Visit: Payer: Self-pay | Admitting: Family Medicine

## 2024-03-11 DIAGNOSIS — I10 Essential (primary) hypertension: Secondary | ICD-10-CM

## 2024-03-20 ENCOUNTER — Other Ambulatory Visit: Payer: Self-pay | Admitting: Medical Genetics

## 2024-03-22 ENCOUNTER — Ambulatory Visit: Payer: Self-pay

## 2024-03-22 NOTE — Telephone Encounter (Signed)
 FYI - the patient is scheduled with Joy on 03/23/24 at 1 pm.  Per E2C2 Triage RN - 3x breast cancer survivor in both breasts. Had partial left breast mastectomy. Partially inverted left nipple since then.  Yesterday onset severe rash under left breast. Today left nipple no longer inverted. Has wax build up around the nipple. Cleaned it out thinking it was soap build up but more is coming out. Breast tingling, no pain. Mild migraine per pt. Nausea. No fever. No redness or swelling. Dimple the size of a nickel on the areola.

## 2024-03-22 NOTE — Telephone Encounter (Signed)
 FYI Only or Action Required?: FYI only for provider: appointment scheduled on 2/6.  Patient was last seen in primary care on 10/27/2023 by Margaret Dorothyann BIRCH, MD.  Called Nurse Triage reporting Breast Problem.  Symptoms began yesterday.  Interventions attempted: OTC medications: Corticosteroid cream and Other: Cleaned out the wax.  Symptoms are: gradually worsening.  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 Had partial left breast mastectomy. Partially inverted left nipple since then.   Yesterday onset severe rash under left breast. Today left nipple no longer inverted. Has wax build up around the nipple. Cleaned it out thinking it was soap build up but more is coming out. Breast tingling, no pain. Mild migraine per pt. Nausea. No fever. No redness or swelling. Dimple the size of a nickel on the areola.  Scheduled appt with different provider at home office tomorrow d/t no PCP availability within timeframe. Advised UC or ED for worsening symptoms.     Message from Margaret Beasley sent at 03/22/2024  2:14 PM EST  Reason for Triage: 3 time breast cancer survivor. Nipple was inverted for 2 years since after breast surgery and Nipple is no longer inverted. It has a wax like substance around and it has a rash in the crack of the nipple. Looks like dried ear wax. Patient is freaking out.   Reason for Disposition  Change in shape or appearance of breast  Answer Assessment - Initial Assessment Questions 1. SYMPTOM: What's the main symptom you're concerned about?  (e.g., lump, nipple discharge, pain, rash)     Rash under   2. LOCATION: Where is the rash, everted nipple, waxy substance located?     Left breast and nipple  3. ONSET: When did rash, everted nipple, waxy substance  start?     Yesterday  4. PRIOR HISTORY: Do you have any history of  prior problems with your breasts? (e.g., breast cancer, breast implant, fibrocystic breast disease)     Double breast cancer survivor  5. CAUSE: What do you think is causing this symptom?     Unsure  6. OTHER SYMPTOMS: Do you have any other symptoms? (e.g., breast pain, fever, nipple discharge, redness or rash)     Nickel sized dimple on areola, nausea, breast tingling, mild migraine  Protocols used: Breast Symptoms-A-AH

## 2024-03-23 ENCOUNTER — Ambulatory Visit: Admitting: Medical-Surgical

## 2024-03-23 ENCOUNTER — Encounter: Payer: Self-pay | Admitting: Medical-Surgical

## 2024-03-23 VITALS — BP 138/84 | HR 74 | Resp 20 | Ht 63.0 in

## 2024-03-23 DIAGNOSIS — R234 Changes in skin texture: Secondary | ICD-10-CM

## 2024-03-23 DIAGNOSIS — L304 Erythema intertrigo: Secondary | ICD-10-CM

## 2024-03-23 MED ORDER — CLOTRIMAZOLE-BETAMETHASONE 1-0.05 % EX CREA: 1.0000 | TOPICAL_CREAM | Freq: Two times a day (BID) | CUTANEOUS | 0 refills | Status: AC | PRN

## 2024-03-23 NOTE — Progress Notes (Signed)
" ° °       Established patient visit   History of Present Illness   Discussed the use of AI scribe software for clinical note transcription with the patient, who gave verbal consent to proceed.  History of Present Illness   Margaret Beasley is a 69 year old female with a history of breast cancer who presents with changes in her left breast and a rash.  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 - Recently, previously inverted left nipple became everted - New changes in breast contour with significant dimpling and multiple palpable 'knots,' most prominent in the left breast - Right breast appears larger and occasionally appears purple - No new trauma to the breasts  Nipple discharge - Left nipple discharge described as wax-like - Discharge removed with tweezers - No assessment of discharge from the right nipple  Cutaneous breast findings - Itchy, mildly burning rash under the left breast - Rash has an 'orange peel' (peau dorange) appearance  Breast tenderness and pain - Focal tenderness in areas of the breast - No significant pain    Physical Exam   Physical Exam Vitals reviewed.  Constitutional:      General: She is not in acute distress.    Appearance: Normal appearance.  HENT:     Head: Normocephalic and atraumatic.  Cardiovascular:     Rate and Rhythm: Normal rate and regular rhythm.     Pulses: Normal pulses.     Heart sounds: Normal heart sounds. No murmur heard.    No friction rub. No gallop.  Pulmonary:     Effort: Pulmonary effort is normal. No respiratory distress.     Breath sounds: Normal breath sounds. No wheezing.  Skin:    General: Skin is warm and dry.  Neurological:     Mental Status: She is alert and oriented to person, place, and time.  Psychiatric:        Mood and Affect: Mood normal.        Behavior: Behavior normal.         Thought Content: Thought content normal.        Judgment: Judgment normal.    Assessment & Plan   Breast skin and nipple changes in breast cancer survivor Multiple breast cancers with recent left breast changes including nipple inversion, nipple discharge, periareolar dimple, and inframammary rash. Right breast shows color and texture changes. - Proceed with scheduled diagnostic mammogram on 03/26/2024 and schedule follow-up with PCP on 03/28/2024.  Intertrigo Rash under left breast likely due to skin fold irritation and moisture. - Prescribed Lotrisone  twice daily for up to 14 days.     Follow up   Return if symptoms worsen or fail to improve. __________________________________ Zada FREDRIK Palin, DNP, APRN, FNP-BC Primary Care and Sports Medicine [facility] [place] "

## 2024-03-26 ENCOUNTER — Encounter

## 2024-03-28 ENCOUNTER — Ambulatory Visit: Admitting: Family Medicine

## 2024-04-19 ENCOUNTER — Other Ambulatory Visit (HOSPITAL_COMMUNITY)
